# Patient Record
Sex: Female | Born: 1970
Health system: Southern US, Community
[De-identification: ages and names within clinical notes are randomized; demographics above are authoritative.]

## PROBLEM LIST (undated history)

## (undated) ENCOUNTER — Emergency Department (HOSPITAL_COMMUNITY): Admission: EM | Payer: No Typology Code available for payment source | Source: Home / Self Care

## (undated) ENCOUNTER — Ambulatory Visit (HOSPITAL_COMMUNITY): Payer: Medicare HMO

## (undated) DIAGNOSIS — F419 Anxiety disorder, unspecified: Secondary | ICD-10-CM

## (undated) DIAGNOSIS — T4145XA Adverse effect of unspecified anesthetic, initial encounter: Secondary | ICD-10-CM

## (undated) DIAGNOSIS — M545 Low back pain, unspecified: Secondary | ICD-10-CM

## (undated) DIAGNOSIS — E7800 Pure hypercholesterolemia, unspecified: Secondary | ICD-10-CM

## (undated) DIAGNOSIS — M1990 Unspecified osteoarthritis, unspecified site: Secondary | ICD-10-CM

## (undated) DIAGNOSIS — D259 Leiomyoma of uterus, unspecified: Secondary | ICD-10-CM

## (undated) DIAGNOSIS — G8929 Other chronic pain: Secondary | ICD-10-CM

## (undated) DIAGNOSIS — M549 Dorsalgia, unspecified: Secondary | ICD-10-CM

## (undated) DIAGNOSIS — T8859XA Other complications of anesthesia, initial encounter: Secondary | ICD-10-CM

## (undated) DIAGNOSIS — I10 Essential (primary) hypertension: Secondary | ICD-10-CM

## (undated) HISTORY — PX: BACK SURGERY: SHX140

## (undated) HISTORY — PX: TUBAL LIGATION: SHX77

## (undated) HISTORY — PX: ENDOMETRIAL ABLATION: SHX621

## (undated) HISTORY — PX: ECTOPIC PREGNANCY SURGERY: SHX613

---
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
## 2004-07-01 HISTORY — PX: LUMBAR DISC SURGERY: SHX700

## 2004-12-19 ENCOUNTER — Emergency Department (HOSPITAL_COMMUNITY): Admission: EM | Admit: 2004-12-19 | Discharge: 2004-12-19 | Payer: Self-pay | Admitting: Emergency Medicine

## 2004-12-29 ENCOUNTER — Emergency Department (HOSPITAL_COMMUNITY): Admission: EM | Admit: 2004-12-29 | Discharge: 2004-12-29 | Payer: Self-pay | Admitting: Emergency Medicine

## 2005-01-02 ENCOUNTER — Ambulatory Visit: Payer: Self-pay | Admitting: Orthopedic Surgery

## 2005-01-21 ENCOUNTER — Ambulatory Visit: Payer: Self-pay | Admitting: Orthopedic Surgery

## 2005-02-08 ENCOUNTER — Encounter: Admission: RE | Admit: 2005-02-08 | Discharge: 2005-02-08 | Payer: Self-pay | Admitting: Orthopedic Surgery

## 2005-02-11 ENCOUNTER — Ambulatory Visit: Payer: Self-pay | Admitting: Orthopedic Surgery

## 2005-02-18 ENCOUNTER — Ambulatory Visit: Payer: Self-pay | Admitting: Orthopedic Surgery

## 2005-02-19 ENCOUNTER — Inpatient Hospital Stay (HOSPITAL_COMMUNITY): Admission: AD | Admit: 2005-02-19 | Discharge: 2005-02-22 | Payer: Self-pay | Admitting: Orthopedic Surgery

## 2005-02-28 ENCOUNTER — Ambulatory Visit: Payer: Self-pay | Admitting: Orthopedic Surgery

## 2005-03-29 ENCOUNTER — Encounter: Admission: RE | Admit: 2005-03-29 | Discharge: 2005-03-29 | Payer: Self-pay | Admitting: Neurosurgery

## 2005-04-02 ENCOUNTER — Encounter: Admission: RE | Admit: 2005-04-02 | Discharge: 2005-04-02 | Payer: Self-pay | Admitting: Neurosurgery

## 2005-05-28 ENCOUNTER — Ambulatory Visit (HOSPITAL_COMMUNITY): Admission: RE | Admit: 2005-05-28 | Discharge: 2005-05-29 | Payer: Self-pay | Admitting: Neurosurgery

## 2005-07-03 ENCOUNTER — Encounter: Admission: RE | Admit: 2005-07-03 | Discharge: 2005-07-03 | Payer: Self-pay | Admitting: Neurosurgery

## 2005-07-10 ENCOUNTER — Ambulatory Visit (HOSPITAL_COMMUNITY): Admission: RE | Admit: 2005-07-10 | Discharge: 2005-07-10 | Payer: Self-pay | Admitting: Neurosurgery

## 2005-07-14 ENCOUNTER — Encounter: Admission: RE | Admit: 2005-07-14 | Discharge: 2005-07-14 | Payer: Self-pay | Admitting: Neurosurgery

## 2006-01-20 ENCOUNTER — Encounter
Admission: RE | Admit: 2006-01-20 | Discharge: 2006-01-20 | Payer: Self-pay | Admitting: Physical Medicine and Rehabilitation

## 2006-04-16 ENCOUNTER — Other Ambulatory Visit: Admission: RE | Admit: 2006-04-16 | Discharge: 2006-04-16 | Payer: Self-pay | Admitting: Obstetrics and Gynecology

## 2006-08-25 ENCOUNTER — Ambulatory Visit (HOSPITAL_COMMUNITY): Admission: RE | Admit: 2006-08-25 | Discharge: 2006-08-25 | Payer: Self-pay | Admitting: Obstetrics and Gynecology

## 2006-09-03 ENCOUNTER — Emergency Department (HOSPITAL_COMMUNITY): Admission: EM | Admit: 2006-09-03 | Discharge: 2006-09-03 | Payer: Self-pay | Admitting: Emergency Medicine

## 2007-07-15 ENCOUNTER — Ambulatory Visit (HOSPITAL_COMMUNITY): Admission: RE | Admit: 2007-07-15 | Discharge: 2007-07-15 | Payer: Self-pay | Admitting: Obstetrics and Gynecology

## 2007-07-28 ENCOUNTER — Other Ambulatory Visit: Admission: RE | Admit: 2007-07-28 | Discharge: 2007-07-28 | Payer: Self-pay | Admitting: Obstetrics and Gynecology

## 2007-08-10 ENCOUNTER — Ambulatory Visit (HOSPITAL_COMMUNITY): Admission: RE | Admit: 2007-08-10 | Discharge: 2007-08-10 | Payer: Self-pay | Admitting: Obstetrics and Gynecology

## 2007-08-19 ENCOUNTER — Ambulatory Visit (HOSPITAL_COMMUNITY): Admission: RE | Admit: 2007-08-19 | Discharge: 2007-08-19 | Payer: Self-pay | Admitting: Obstetrics and Gynecology

## 2007-09-16 ENCOUNTER — Ambulatory Visit (HOSPITAL_COMMUNITY): Admission: RE | Admit: 2007-09-16 | Discharge: 2007-09-16 | Payer: Self-pay | Admitting: Obstetrics and Gynecology

## 2007-10-13 ENCOUNTER — Inpatient Hospital Stay (HOSPITAL_COMMUNITY): Admission: AD | Admit: 2007-10-13 | Discharge: 2007-10-13 | Payer: Self-pay | Admitting: Obstetrics and Gynecology

## 2007-10-15 ENCOUNTER — Inpatient Hospital Stay (HOSPITAL_COMMUNITY): Admission: AD | Admit: 2007-10-15 | Discharge: 2007-10-19 | Payer: Self-pay | Admitting: Obstetrics and Gynecology

## 2007-10-17 ENCOUNTER — Encounter (INDEPENDENT_AMBULATORY_CARE_PROVIDER_SITE_OTHER): Payer: Self-pay | Admitting: Obstetrics and Gynecology

## 2007-11-28 ENCOUNTER — Inpatient Hospital Stay (HOSPITAL_COMMUNITY): Admission: AD | Admit: 2007-11-28 | Discharge: 2007-12-03 | Payer: Self-pay | Admitting: General Surgery

## 2008-05-18 ENCOUNTER — Ambulatory Visit (HOSPITAL_COMMUNITY): Admission: RE | Admit: 2008-05-18 | Discharge: 2008-05-18 | Payer: Self-pay | Admitting: Neurological Surgery

## 2008-05-21 ENCOUNTER — Ambulatory Visit (HOSPITAL_COMMUNITY): Admission: RE | Admit: 2008-05-21 | Discharge: 2008-05-21 | Payer: Self-pay | Admitting: Neurological Surgery

## 2009-09-06 ENCOUNTER — Ambulatory Visit (HOSPITAL_COMMUNITY): Admission: RE | Admit: 2009-09-06 | Discharge: 2009-09-06 | Payer: Self-pay | Admitting: Obstetrics and Gynecology

## 2010-06-07 ENCOUNTER — Observation Stay (HOSPITAL_COMMUNITY)
Admission: EM | Admit: 2010-06-07 | Discharge: 2010-06-07 | Payer: Self-pay | Source: Home / Self Care | Admitting: Emergency Medicine

## 2010-07-22 ENCOUNTER — Encounter: Payer: Self-pay | Admitting: Orthopedic Surgery

## 2010-07-22 ENCOUNTER — Encounter: Payer: Self-pay | Admitting: Obstetrics and Gynecology

## 2010-08-13 ENCOUNTER — Other Ambulatory Visit: Payer: Self-pay | Admitting: Dermatology

## 2010-09-10 LAB — CBC
HCT: 38.6 % (ref 36.0–46.0)
Hemoglobin: 12.6 g/dL (ref 12.0–15.0)
MCV: 80.8 fL (ref 78.0–100.0)
RBC: 4.78 MIL/uL (ref 3.87–5.11)
WBC: 10.8 10*3/uL — ABNORMAL HIGH (ref 4.0–10.5)

## 2010-09-10 LAB — URINE MICROSCOPIC-ADD ON

## 2010-09-10 LAB — POCT I-STAT, CHEM 8
BUN: 12 mg/dL (ref 6–23)
BUN: 13 mg/dL (ref 6–23)
Calcium, Ion: 1.05 mmol/L — ABNORMAL LOW (ref 1.12–1.32)
Chloride: 106 mEq/L (ref 96–112)
Creatinine, Ser: 1.2 mg/dL (ref 0.4–1.2)
Creatinine, Ser: 1.2 mg/dL (ref 0.4–1.2)
Glucose, Bld: 120 mg/dL — ABNORMAL HIGH (ref 70–99)
Hemoglobin: 14.3 g/dL (ref 12.0–15.0)
Sodium: 137 mEq/L (ref 135–145)
TCO2: 25 mmol/L (ref 0–100)
TCO2: 28 mmol/L (ref 0–100)

## 2010-09-10 LAB — DIFFERENTIAL
Basophils Absolute: 0.1 10*3/uL (ref 0.0–0.1)
Eosinophils Relative: 2 % (ref 0–5)
Lymphocytes Relative: 23 % (ref 12–46)
Lymphs Abs: 2.4 10*3/uL (ref 0.7–4.0)
Monocytes Absolute: 0.6 10*3/uL (ref 0.1–1.0)
Monocytes Relative: 6 % (ref 3–12)
Neutro Abs: 7.4 10*3/uL (ref 1.7–7.7)

## 2010-09-10 LAB — URINALYSIS, ROUTINE W REFLEX MICROSCOPIC
Glucose, UA: NEGATIVE mg/dL
Hgb urine dipstick: NEGATIVE
Ketones, ur: NEGATIVE mg/dL
Leukocytes, UA: NEGATIVE
pH: 6 (ref 5.0–8.0)

## 2010-09-21 ENCOUNTER — Inpatient Hospital Stay (HOSPITAL_COMMUNITY): Payer: 59

## 2010-09-21 ENCOUNTER — Observation Stay (HOSPITAL_COMMUNITY)
Admission: AD | Admit: 2010-09-21 | Discharge: 2010-09-23 | Disposition: A | Discharge status: Home / Self Care | Payer: 59 | Source: Ambulatory Visit | Attending: Obstetrics and Gynecology | Admitting: Obstetrics and Gynecology

## 2010-09-21 ENCOUNTER — Observation Stay (HOSPITAL_COMMUNITY): Payer: 59

## 2010-09-21 DIAGNOSIS — N949 Unspecified condition associated with female genital organs and menstrual cycle: Secondary | ICD-10-CM | POA: Insufficient documentation

## 2010-09-21 DIAGNOSIS — I10 Essential (primary) hypertension: Secondary | ICD-10-CM | POA: Insufficient documentation

## 2010-09-21 DIAGNOSIS — N739 Female pelvic inflammatory disease, unspecified: Secondary | ICD-10-CM

## 2010-09-21 DIAGNOSIS — R109 Unspecified abdominal pain: Secondary | ICD-10-CM

## 2010-09-21 LAB — COMPREHENSIVE METABOLIC PANEL
ALT: 13 U/L (ref 0–35)
AST: 18 U/L (ref 0–37)
Albumin: 3.7 g/dL (ref 3.5–5.2)
Alkaline Phosphatase: 38 U/L — ABNORMAL LOW (ref 39–117)
BUN: 16 mg/dL (ref 6–23)
GFR calc Af Amer: 59 mL/min — ABNORMAL LOW (ref >60)
Potassium: 3.8 mEq/L (ref 3.5–5.1)
Sodium: 137 mEq/L (ref 135–145)
Total Protein: 7.2 g/dL (ref 6.0–8.3)

## 2010-09-21 LAB — MRSA PCR SCREENING: MRSA by PCR: NEGATIVE

## 2010-09-21 LAB — CBC
Hemoglobin: 11.4 g/dL — ABNORMAL LOW (ref 12.0–15.0)
MCV: 83.2 fL (ref 78.0–100.0)
Platelets: 297 10*3/uL (ref 150–400)
Platelets: 283 10*3/uL (ref 150–400)
RBC: 4.56 MIL/uL (ref 3.87–5.11)
RBC: 4.28 MIL/uL (ref 3.87–5.11)
RDW: 14 % (ref 11.5–15.5)
WBC: 14.3 10*3/uL — ABNORMAL HIGH (ref 4.0–10.5)
WBC: 13.4 10*3/uL — ABNORMAL HIGH (ref 4.0–10.5)

## 2010-09-21 LAB — DIFFERENTIAL
Basophils Absolute: 0.1 10*3/uL (ref 0.0–0.1)
Basophils Relative: 1 % (ref 0–1)
Eosinophils Absolute: 0.4 10*3/uL (ref 0.0–0.7)
Eosinophils Absolute: 0.3 10*3/uL (ref 0.0–0.7)
Eosinophils Relative: 3 % (ref 0–5)
Lymphs Abs: 3.4 10*3/uL (ref 0.7–4.0)
Neutro Abs: 9 10*3/uL — ABNORMAL HIGH (ref 1.7–7.7)
Neutrophils Relative %: 68 % (ref 43–77)
Neutrophils Relative %: 67 % (ref 43–77)

## 2010-09-21 LAB — WET PREP, GENITAL: Yeast Wet Prep HPF POC: NONE SEEN

## 2010-09-21 MED ORDER — IOHEXOL 300 MG/ML  SOLN
100.0000 mL | Freq: Once | INTRAMUSCULAR | Status: AC | PRN
  Administered 2010-09-21: 100 mL via INTRAVENOUS

## 2010-09-22 LAB — GC/CHLAMYDIA PROBE AMP, GENITAL
Chlamydia, DNA Probe: NEGATIVE
GC Probe Amp, Genital: NEGATIVE

## 2010-09-23 LAB — CBC
HCT: 35.8 % — ABNORMAL LOW (ref 36.0–46.0)
Hemoglobin: 11.5 g/dL — ABNORMAL LOW (ref 12.0–15.0)
Hemoglobin: 11.4 g/dL — ABNORMAL LOW (ref 12.0–15.0)
MCHC: 31.7 g/dL (ref 30.0–36.0)
MCV: 75.6 fL — ABNORMAL LOW (ref 78.0–100.0)
RBC: 4.73 MIL/uL (ref 3.87–5.11)
WBC: 11.6 10*3/uL — ABNORMAL HIGH (ref 4.0–10.5)
WBC: 7.6 10*3/uL (ref 4.0–10.5)

## 2010-09-23 LAB — DIFFERENTIAL
Basophils Absolute: 0.1 10*3/uL (ref 0.0–0.1)
Basophils Relative: 1 % (ref 0–1)
Lymphocytes Relative: 26 % (ref 12–46)
Neutro Abs: 7.3 10*3/uL (ref 1.7–7.7)
Neutrophils Relative %: 63 % (ref 43–77)

## 2010-09-25 NOTE — Discharge Summary (Signed)
  Taylor Bowman, GAD                ACCOUNT NO.:  [id number]  MEDICAL RECORD NO.:  [id number]           PATIENT TYPE:  O  LOCATION:  9306                          [facility]  [facility]  PHYSICIAN:  Gerald Leitz, MD          DATE OF BIRTH:  [birth year]  DATE OF ADMISSION:  09/21/2010 DATE OF DISCHARGE:  09/23/2010                              DISCHARGE SUMMARY   ADMISSION DIAGNOSES: 1. Pelvic pain. 2. Abdominal pain. 3. Hypertension.  DISCHARGE DIAGNOSES: 1. Pelvic inflammatory disease. 2. Hypertension.  BRIEF HOSPITAL COURSE:  The patient presented to the Maternity Admissions Unit on September 21, 2010 complaining of pelvic and lower abdominal pain.  She was seen by the nurse practitioner in MAU and there was concern for appendicitis.  CT scan was ordered and this was negative.  She was diagnosed with pelvic inflammatory disease based on physical exam.  She received cefotetan 2 grams IV q.12 h. as well as doxycycline 100 mg t.i.d. with improvement of her pelvic pain.  She is discharged home on hospital day #3 in stable and improved condition on the following medications, doxycycline, metronidazole, Motrin, and Diflucan.  She will follow up in the office in 2 weeks for a hospital followup visit.     Gerald Leitz, MD     TC/MEDQ  D:  09/23/2010  T:  09/24/2010  Job:  [id number]  Electronically Signed by Gerald Leitz MD on 09/25/2010 06:34:28 PM

## 2010-11-13 NOTE — H&P (Signed)
NAMEMARRIAH, Taylor Bowman NO.:  [id number]   MEDICAL RECORD NO.:  [id number]          PATIENT TYPE:  MAT   LOCATION:  MATC                          [facility]  [facility]   PHYSICIAN:  Gerald Leitz, MD          DATE OF BIRTH:  July 13, 1970   DATE OF ADMISSION:  10/15/2007  DATE OF DISCHARGE:                              HISTORY & PHYSICAL   CHIEF COMPLAINT:  High blood pressure, headache.   CURRENT HISTORY:  This is a 40 year old G5, P1-0-3-1, at approximately  26 weeks 3 days' estimated gestational age based on a 12-week ultrasound  with estimated date of delivery of January 18, 2008, pregnancy complicated  by severe chronic hypertension, advanced maternal age, use of ACE  inhibitor in first trimester and chronic back pain.  The patient  presents to Maternity Admission Unit complaining of blood pressure at  home of 160/100 even after taking a double dose of her Norvasc.  She is  complaining of headache as well as seeing spots at times, no blurred  vision, some shortness of breath, no right upper quadrant pain, positive  fetal movement, no leakage of fluid, no vaginal bleeding, occasional  contractions.   PAST OBSTETRICAL HISTORY:  1. Spontaneous vaginal delivery in 1993, induced secondary to      preeclampsia at 33-35 weeks' gestational age.  2. Ectopic pregnancy in 1998.  3. Ectopic in 2002.  4. Elective abortion in 2008.   PAST MEDICAL HISTORY:  Chronic hypertension.   PAST SURGICAL HISTORY:  1. Left salpingectomy.  2. Two laparoscopies for ectopic pregnancies.  3. Oral surgery.  4. Diskectomy in 2006.   MEDICATIONS:  1. Norvasc 10 mg daily.  2. Methyldopa 1000 mg t.i.d.  3. Prenatal vitamins.  4. Flexeril as needed.  5. Opana as needed for back pain.   ALLERGIES:  DEMEROL and CODEINE.   SOCIAL HISTORY:  The patient is divorced.  She denies tobacco, alcohol  or illicit drug use.   FAMILY HISTORY:  Noncontributory.   REVIEW OF SYSTEMS:  Negative except as  stated in history of current  illness.   PHYSICAL EXAM:  VITAL SIGNS: Blood pressure 143/69, heart rate 112,  saturations 96% on room air, respirations 20.  CARDIOVASCULAR:  Slightly tachycardic, regular rhythm.  LUNGS:  Clear to auscultation bilaterally without rales, rhonchi or  wheezing.  ABDOMEN:  Gravid, nontender.  EXTREMITIES:  Trace edema.  Reflexes 2+.  No clonus.   ASSESSMENT AND PLAN:  A 26-3/7-week intrauterine pregnancy with chronic  hypertension.  We will admit for observation, serial blood pressures, 23-  hour urine to rule out possible superimposed preeclampsia, since the  patient required 2 doses of Norvasc this morning.  We will continue her  antihypertensives.      Gerald Leitz, MD  Electronically Signed     TC/MEDQ  D:  10/15/2007  T:  10/15/2007  Job:  [id number]

## 2010-11-13 NOTE — Op Note (Signed)
NAMEAMORI, COOPERMAN NO.:  [id number]   MEDICAL RECORD NO.:  [id number]          PATIENT TYPE:  OBV   LOCATION:  5013                         FACILITY:  [facility]   PHYSICIAN:  Ollen Gross. Vernell Morgans, M.D. DATE OF BIRTH:  [birth year]   DATE OF PROCEDURE:  DATE OF DISCHARGE:                               OPERATIVE REPORT   PREOPERATIVE DIAGNOSES:  Right groin abscess and the left leg abscess.   POSTOPERATIVE DIAGNOSES:  Right groin abscess and the left leg abscess.   PROCEDURE:  Incision and drainage of right groin and left leg abscess.   SURGEON:  Ollen Gross. Vernell Morgans, M.D.   ANESTHESIA:  General.   DESCRIPTION OF PROCEDURE:  After informed consent was obtained, the  patient was brought to the operating room and placed in the supine  position on the operating room table.  After induction of general  anesthesia, the patient's right groin and left leg were prepped with  Betadine and draped in usual sterile manner.  Attention was first turned  to the left leg, a small cruciate-type incision was made overlying the  palpable abscess with a #15 blade knife.  This incision was carried down  to the skin and into the subcutaneous tissue and abscess pocket blunted  with a hemostat.  Once the pocket was opened, hemostasis was achieved  using the Bovie electrocautery.  The wound was then packed with 2 x 2  gauze and the sterile dressings were applied.  Attention was then turned  to the right groin.  The patient had an opening in the skin and the  cavity was probed with the hemostat.  The cavity extended both  superiorly, inferiorly, and medially.  The incision was opened further  sharply with the electrocautery down through the skin and the  subcutaneous tissue into the abscess cavity.  Hemostasis was achieved  using the Bovie electrocautery.  Once the cavity had been completely  opened and loculations have been broken up blunt finger dissection, and  the wound was packed with  moistened Kerlix gauze and sterile dressings  were applied.  The patient tolerated the procedure well.  At the end of  the case, all needles, sponge, and instrument counts were correct.  The  patient was then awaken and taken to the recovery room in stable  condition.      Ollen Gross. Vernell Morgans, M.D.  Electronically Signed     PST/MEDQ  D:  11/27/2007  T:  11/28/2007  Job:  [id number]

## 2010-11-13 NOTE — Discharge Summary (Signed)
Taylor Bowman, ABER NO.:  [id number]   MEDICAL RECORD NO.:  [id number]          PATIENT TYPE:  INP   LOCATION:  5013                         FACILITY:  [facility]   PHYSICIAN:  Cherylynn Ridges, M.D.    DATE OF BIRTH:  1970-10-24   DATE OF ADMISSION:  11/27/2007  DATE OF DISCHARGE:  12/03/2007                               DISCHARGE SUMMARY   DISCHARGING PHYSICIAN:  Angelia Mould. Derrell Lolling, MD   OPERATIVE SURGEON:  Ollen Gross. Vernell Morgans, MD   CHIEF COMPLAINT AND REASON FOR ADMISSION:  Taylor Bowman is a 40 year old  female patient recent postpartum after premature delivery.  She was seen  by Dr. Lindie Spruce at [facility] in our office on Friday 29, 2009 with  complaints of abscess in right thigh.  She states this area as started  as what she thought was an insect bite.  There were tiny areas and they  were very pruritic in nature.  Eventually, she had 2 abscesses evolve a  smaller of the left lateral knee and the larger one on the right medial  thigh near the groin.  These areas were draining and cultures were  obtained by Dr. Lindie Spruce, and the patient was subsequently admitted to the  hospital for observation and possible intraoperative incision and  drainage.   ADMITTING DIAGNOSES:  1. Right groin and left lateral thigh abscesses concerning for      possible methicillin-resistant Staphylococcus aureus.  2. Lactating female, recent delivery of premature infant.   HOSPITAL COURSE:  The patient was admitted to [facility] where she  was started empirically on Kefzol 1 g and called to OR.  She  subsequently was taken to the OR by Dr. Carolynne Edouard where she underwent an I&D  of right groin and left leg abscesses.  The patient tolerated procedure  well.  There was minimal amount of purulent material obtained and since  cultures have been obtained at the office, no cultures were obtained in  the OR.  The patient was sent back to the general floor to recover where  she was started on normal  saline packing b.i.d. and her antibiotic  coverage was narrowed to vancomycin IV.   From a wound standpoint, the wound remained clean.  She did have some  marked purulent induration which gradually decreased over the next  several days, especially with use of localized heating pad to the  region.  She also had some soft edema of the thigh which also gradually  decreased throughout the hospitalization.   The patient's main issues postoperatively were regarding pain control.  She required PCA Dilaudid.  IV Toradol was added and later the patient  developed issues with nausea and vomiting.  Bowel sounds are present.  Abdomen was otherwise benign.  Pain medications were readjusted.  She  was at one point attempted on p.o. Vicodin, but this caused nausea and  vomiting so she was weaned off the PCA Dilaudid, switched to p.o.  Dilaudid with Tylenol, continued IV Toradol which she did well on.  Protonix was added.  She did have difficulties towards  the end of her  hospitalization with poor IV access and at that time she was receiving  IV vancomycin as well as p.o. Cipro which had been added on November 30, 2007  by Dr. Derrell Lolling.  PICC line was required and the patient had this placed  for IV team, tolerated well, and this was discontinued prior to  discharge.  The PICC line was located in the left upper extremity.   Final wound cultures were obtained from the office which demonstrated  methicillin-resistant Staph, sensitive to Cipro, Levaquin, tetracycline,  and Bactrim.  Because the patient is a lactating female, the  tetracyclines were obviously not indicated and she was giving breast  milk to her child and we discussed with pharmacy and it was a determined  that although Cipro was not directly harmful they did discuss with  [facility] and stated we could use Cipro, but Dr. Jacinto Halim  preference was for the Bactrim and if we wish to discard the milk to  make sure there was no Cipro within the  milk 6-hour period as the last  dose was indicated.  The patient received her last dose at 8 a.m. on the  date of discharge.   On the date of discharge, the patient's leg again had marked decrease  and soft edema.  The induration had nearly resolved.  The wound base  itself was pink and granular.  Important note that on postop day 2, Dr.  Derrell Lolling had evaluated the patient because she was concerned about the  degree of swelling and induration and given the fact that this was a  MRSA infection it was felt that the only way to rapidly decrease the  induration was to do a wider excision of the wound tissue in the OR.  The patient was informed this would leave a larger scar.  At this time,  the patient has deferred and wishes to treat this issue medically.   The patient has a history of pregnancy-induced hypertension and  preeclampsia and was on Norvasc preadmission.  This medication was  resumed 24 hours prior to discharge because the patient's blood pressure  had finally rebounded to her preadmission state and was in the  hypertensive range.   FINAL DISCHARGE DIAGNOSES:  1. Right medial thigh/groin and left lateral knee abscesses, positive      for methicillin-resistant Staph.  2. Status post intraoperative incision and drainage of above-stated      abscesses by Dr. Carolynne Edouard.  3. History of preeclampsia and hypertension status post recent      premature delivery.  4. Lactating female.   DISCHARGE MEDICATIONS:  The patient will resume the following  medications.  1. Norvasc 10 mg daily.  2. Multiple vitamin daily.  3. New medications include Bactrim DS one p.o. b.i.d. for 14 days.  4. Protonix 40 mg daily while on ibuprofen.  5. Dilaudid 2 mg p.o. q.4 h. p.r.n. pain #40 dispensed with 0 refills.  6. Ibuprofen 600 mg every 8 hours as needed for pain.  7. Over-the-counter Colace 100 mg 2-3 times daily to prevent      constipation.  8. Over-the-counter MiraLax as directed on bottle to  prevent      constipation.  Please note the patient was started on MiraLax and      Colace 24 hours prior to discharge.   DISCHARGE INSTRUCTIONS:  Return to work not applicable.  The patient has  been out on work disability since 2006.  Diet, no restrictions.  Wound  care, normal saline packing into the wound twice daily, home health RN  to assist.  [facility] seen the patient in the hospital.  Activity, increase activity slowly, may walk up steps, may shower.  Other instructions, the patient has been informed that her last dose of  Cipro was 8 o'clock today.  Please discard any breast milk already  pumped or saved.  Also, please discard any pumped breast milk for the  next 6  hours from the time of dosing of Cipro and make sure you follow up with  lactation consult at [facility] after discharge.  You have to call  the surgeon if you have any problems or questions regarding your wound.   FOLLOWUP APPOINTMENTS:  She is to call Dr. Billey Chang office at [phone] to  be seen in 2 weeks.      Allison L. Rennis Harding, N.P.      Cherylynn Ridges, M.D.  Electronically Signed    ALE/MEDQ  D:  12/03/2007  T:  12/03/2007  Job:  [id number]   cc:   Ollen Gross. Vernell Morgans, M.D.

## 2010-11-13 NOTE — H&P (Signed)
NAMEAMADI, Taylor Bowman   MEDICAL RECORD NO.:  [id number]          PATIENT TYPE:  OBV   LOCATION:  5013                         FACILITY:  [facility]   PHYSICIAN:  Cherylynn Ridges, M.D.    DATE OF BIRTH:  [birth year]   DATE OF ADMISSION:  11/27/2007  DATE OF DISCHARGE:                              HISTORY & PHYSICAL   IDENTIFICATION AND CHIEF COMPLAINT:  The patient is a 40 year old female  with a large right thigh abscesses who is being transferred to [facility] for consideration of surgical debridement and irrigation and  drainage of a right thigh abscess.   HISTORY OF PRESENT ILLNESS:  The patient started having this problem on  Sunday, when she developed what was thought to be small insect bite on  the inner portion of her right upper thigh.  She is subsequently  developed a similar abscess on the left leg.  She has had no fevers and  chills but this has progressively gotten worse where she came in for  evaluation, was sent to our office where she was found to have a very  large abscess, which upon attempt to drain in the office.  She was then  adequately drained because of the large size and severeness of the  patient's pain, because of this I am seeing her in the [facility] short-  stay to be evaluated and worked up for surgical debridement later on  today.   PAST MEDICAL HISTORY:  Unremarkable.  No history of pulmonary vascular  or other disease.   MEDICATIONS:  She is taking medications, Norvasc, multivitamins,  Flexeril, and Opana 40 extended release b.i.d.   ALLERGIES:  BEE STINGS, CODEINE, and DEMEROL.   PAST SURGICAL HISTORY:  She has had a diskectomy in the past, ectopic  pregnancy removal, not sure if she lost a tooth or not and also wisdom  teeth extraction.   SOCIAL HISTORY:  She is a nonsmoker, nondrinker.   REVIEW OF SYSTEMS:  The patient has a 15 point check list, which is all  negative with exception of high blood pressure,  some occasional fatigue,  although she is only 4 weeks postpartum.  She is gravida 2, para 2.   PHYSICAL EXAMINATION:  VITAL SIGNS: Blood pressures 147/97.  She is  afebrile at 98.5.  She weighs 202 pounds.  HEENT:  She is normocephalic  and atraumatic.  EXTREMITIES: I did concentrate on her left leg and  right thigh where she has a horn size almost grapefruit size abscess  with significant tenderness and small opening draining pus, which upon  attempted incision and drainage in the office.  I did get back more pus,  but caused the patient significant bleeding and pain therefore I felt it  was better to get better control with optimal anesthesia in the  hospital.   PLAN:  So the plan is to send the patient [facility] for workup and  drainage.  I have talked to Dr.Toth about this and he wish you will go  ahead and proceed.  I did send a  culture from our office to [facility].  I have given her no antibiotics.  I have written for  her to get some Kefzol on call  to the operating room that may need to  be changed to an antibiotic sensitive that works on the methicillin-  resistant staph.      Cherylynn Ridges, M.D.  Electronically Signed     JOW/MEDQ  D:  11/27/2007  T:  11/28/2007  Job:  [id number]

## 2010-11-16 NOTE — Discharge Summary (Signed)
Taylor Bowman, EMBREE NO.:  [id number]   MEDICAL RECORD NO.:  [id number]          PATIENT TYPE:  INP   LOCATION:  9149                          [facility]ILITY[facility]  [facility]   PHYSICIAN:  Gerald Leitz, MD          DATE OF BIRTH:  1970-10-15   DATE OF ADMISSION:  10/15/2007  DATE OF DISCHARGE:  10/19/2007                               DISCHARGE SUMMARY   DISCHARGE SUMMARY/TRANSFER NOTE   She was transferred to [facility] to Dr. Roney Mans on  October 19, 2007.   ADMISSION DIAGNOSES:  1. A 26-3/7th weeks' intrauterine pregnancy.  2. Chronic hypertension.  3. Rule out superimposed preeclampsia.  4. Narcotic dependence.  5. Advanced maternal age.  6. Chronic back pain.   DISCHARGE DIAGNOSES:  1. A 26-3/7th weeks' intrauterine pregnancy.  2. Chronic hypertension.  3. Rule out superimposed preeclampsia.  4. Narcotic dependence.  5. Advanced maternal age.  6. Chronic back pain.  7. Gestational diabetes.   BRIEF HOSPITAL COURSE:  The patient was admitted on October 15, 2007, with  elevated blood pressure.  She had a blood pressure at home of 160/100,  upon arrival to the hospital, her blood pressure had decreased and was  143/69.  She received multiple narcotics for her headache with only  improvement with IV morphine.  She was admitted and had a 24-hour urine  collection, which showed 6 g of protein that had increased from her  baseline 24-hour urine of 4 g.  The emergency room consult was obtained.  She received steroids for fetal lung maturity.  During her  hospitalization, the patient had several episodes of shortness of breath  and chest pain.  She had a negative chest x-ray and no evidence of  pulmonary edema during these episodes.  She also had negative EKG.  The  shortness of breath resolved spontaneously, however, etiology was  unclear.  Of note, the patient also was diagnosed with gestational  diabetes and started on insulin during her hospitalization.   On October 19, 2007, the patient was transferred to [facility] for  further evaluation and management given continuous episodes of shortness  of breath that would resolved spontaneously and chest pain for which the  etiology was unclear.  She did have a 2-D echo  performed during her hospitalization that was negative as well.  She was  transferred to the care of Dr. Roney Mans with [facility] and she was transferred to [facility].   CONDITION AT DISCHARGE:  Stable.      Gerald Leitz, MD  Electronically Signed     TC/MEDQ  D:  11/01/2007  T:  11/02/2007  Job:  [id number]

## 2010-11-16 NOTE — Op Note (Signed)
NAMEKAITHLYN, TEAGLE NO.:  [id number]   MEDICAL RECORD NO.:  [id number]          PATIENT TYPE:  OIB   LOCATION:  3016                         FACILITY:  [facility]   PHYSICIAN:  Reinaldo Meeker, M.D. DATE OF BIRTH:  04/22/1971   DATE OF PROCEDURE:  05/28/2005  DATE OF DISCHARGE:                                 OPERATIVE REPORT           ______________________________  Reinaldo Meeker, M.D.     ROK/MEDQ  D:  05/28/2005  T:  05/28/2005  Job:  [id number]

## 2010-11-16 NOTE — H&P (Signed)
Taylor Bowman, Taylor Bowman NO.:  [id number]   MEDICAL RECORD NO.:  [id number]          PATIENT TYPE:  INP   LOCATION:  A308                          FACILITY:  [facility]   PHYSICIAN:  Vickki Hearing, M.D.DATE OF BIRTH:  29-Sep-1970   DATE OF ADMISSION:  02/19/2005  DATE OF DISCHARGE:  LH                                HISTORY & PHYSICAL   CHIEF COMPLAINT:  Severe back and leg pain.   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  She was seen in the emergency  room, tried to work and was unable to.  She presented to our office on January 02, 2005, after missing days of work on June 22 and June 23.  When she was  injured, she was injured lifting a patient and felt a pop in her back and  complained of severe, constant, sharp, aching, burning pain with radiation  to the left knee.  Actual date of injury is actually June 17.  She was  placed on Flexeril and Lortab which made her drowsy and did not help her  pain.  She had no associated bowel or bladder disfunction at that time.  I  treated her for several weeks initially with Tylenol No. 3, Skelaxin,  prednisone and amitriptyline.  She did not improve.  On subsequent visits  she was sent for an MRI.  She had a L3 root compression, L4-L5 disc  extrusion, L5-S1 foraminal stenosis.  We recommended epidural steroid  injection at L4-L5 and we took her out of her work retroactive to July 5.  We tried physical therapy, but eventually stopped that because of continued  pain.  After the first ESI, her pain actually got worse.  We asked for  neurosurgery consult and that is pending per review by Worker's  Compensation.  We extended her out of work to 6 weeks.   On Monday, August 21, she was still not improving even with the new  medications of Vicodin, Lyrica and Indocin and I recommended admission to  the hospital for IV steroids and better pain control.  We are  still awaiting  neurosurgery consultation approval per Worker's Compensation.   REVIEW OF SYSTEMS:  All systems normal.   ALLERGIES:  BEE STINGS.   PAST MEDICAL HISTORY:  History of migraine headaches.   PAST SURGICAL HISTORY:  1.  Ectopic pregnancy.  2.  Wisdom tooth extraction.   FAMILY HISTORY:  Heart disease, arthritis and diabetes.   SOCIAL HISTORY:  She is single.  She is a Designer, jewellery.  She does not  smoke or drink.  She has a 2-year nursing degree.   PHYSICAL EXAMINATION:  GENERAL:  She is well-developed, normal grooming,  hygiene and nutrition.  Body habitus medium.  NEUROLOGIC:  Alert and oriented x3.  Decreased sensation in her left L5  distribution.  She has hyperreflexic reflex at the left knee.  CARDIAC:  Normal pulses.  No venous stasis.  No edema.  Normal temperatures.  SKIN:  Without rash,  laceration, incision or ulceration.  LYMPHS:  Nodes were negative.  MUSCULOSKELETAL:  Back exam showed decreased range of motion in all planes.  Increased right paralumbar muscle tone.  Tenderness in L4-S1 with right  paralumbar tight and tender upper extremities.  Normal range of motion,  strength, stability and alignment.  Lower extremities with dorsiflexion  weakness on the left, otherwise range of motion, stability and alignment  were normal.   LABORATORY DATA AND X-RAY FINDINGS:  Radiograph which showed mild  degenerative disc disease and MRI which showed L3 root compression, L4-L5  disc extrusion and L5-S1 foraminal stenosis.  There is an L3-L4 disc  protrusion as well.   ASSESSMENT:  Disc extrusion with severe pain in L3 root impingement.   PLAN:  1.  Pain control.  2.  IV steroids.  3.  Neurontin, increase as tolerated and as necessary.      Vickki Hearing, M.D.  Electronically Signed     SEH/MEDQ  D:  02/20/2005  T:  02/20/2005  Job:  [id number]

## 2010-11-16 NOTE — Discharge Summary (Signed)
NAMECASSIDEE, DEATS NO.:  [id number]   MEDICAL RECORD NO.:  [id number]          PATIENT TYPE:  INP   LOCATION:  A308                          FACILITY:  [facility]   PHYSICIAN:  Vickki Hearing, M.D.DATE OF BIRTH:  11-29-1970   DATE OF ADMISSION:  02/19/2005  DATE OF DISCHARGE:  08/25/2006LH                                 DISCHARGE SUMMARY   ADMISSION DIAGNOSIS:  Severe back and leg pain.   HISTORY OF PRESENT ILLNESS:  A 40 year old female injured at work on June  17.  Workup for back pain and leg pain revealed L3 root compression, L4-L5  disc extrusion, L5-S1 foraminal stenosis.  She was given epidural steroids  and got worse.  The pain became more severe.  She was admitted for pain  control.   HOSPITAL COURSE:  We first tried her on Vicodin, Solu-Medrol and Neurontin  and pain persisted.  We then added morphine and Lorcet plus.  We increased  her Neurontin to 200 mg and it became somewhat better, but not completely  controlled.  She is awaiting approval from the [employer] nurse  to see a Midwife.  Her overall condition has improved slightly and she  will be allowed to go home.   DISCHARGE MEDICATIONS:  1.  Percocet 5/325 mg one q.4h.  2.  Neurontin 200 mg t.i.d.  3.  Prednisone 10 mg daily.  4.  Phenergan 25 mg q.4h. p.r.n. for nausea.   FOLLOW UP:  Follow up with me on August 31.  Will make arrangements for her  to see neurosurgeon pending Worker's Compensation approval.      Vickki Hearing, M.D.  Electronically Signed     SEH/MEDQ  D:  02/22/2005  T:  02/22/2005  Job:  [id number]

## 2010-11-16 NOTE — Op Note (Signed)
Taylor Bowman, Taylor Bowman NO.:  [id number]   MEDICAL RECORD NO.:  [id number]          PATIENT TYPE:  OIB   LOCATION:  3016                         FACILITY:  [facility]   PHYSICIAN:  Reinaldo Meeker, M.D. DATE OF BIRTH:  08-28-1970   DATE OF PROCEDURE:  05/28/2005  DATE OF DISCHARGE:                                 OPERATIVE REPORT   PREOPERATIVE DIAGNOSIS:  Herniated disc L5-S1, left.   POSTOPERATIVE DIAGNOSIS:  Herniated disc L5-S1, left.   PROCEDURE:  Left L5-S1 intralaminar laminotomy for excision of herniated  disc with the operating microscope.   SECONDARY PROCEDURE:  Microdissection L5-S1 disc and S1 nerve root.   SURGEON:  Reinaldo Meeker, M.D.   ASSISTANT:  Kathaleen Maser. Pool, M.D.   PROCEDURE IN DETAIL:  After being placed in the prone position, the  patient's back was prepped and draped in the usual sterile fashion.  A  localizing x-ray was taken prior to incision to identify the appropriate  level.  A midline incision was made above the spinous processes of L5 and  S1.  Using Bovie cutting current, the incision was carried to the spinous  processes.  Subperiosteal dissection was then carried out on the left sided  spinous processes and lamina.  Self-retaining retractor was placed for  exposure.  An x-ray showed we approached the appropriate level.  Using the  high speed drill, the inferior 1/2 of the of the L5 lamina and the medial  1/3 of the facet joint were removed.  The drill was then used to remove the  superior 1/2 of the S1 lamina.  Residual bone and ligamentum flavum were  removed in a piecemeal fashion.  The microscope was draped, brought onto the  field, and used for the remainder of the case.  Using microdissection  technique, the lateral aspect of the thecal sac and S1 nerve root were  identified.  Further coagulation was carried out down to the floor of the  canal to identify the L5-S1 disc which was found to be markedly herniated.  After  coagulating on the annulus, the annulus was incised with a 15 blade.  Using pituitary rongeurs and curets, thorough disc space clean out was  carried out.  At the same time, great care was taken to avoid injury to the  neural elements.  This was successfully done.  At this point, inspection was  carried out in all directions for any evidence of residual compression and  none could be identified.  Large amounts of irrigation were carried out.  Any bleeding was controlled with bipolar coagulation and Gelfoam.  The wound  was then closed using interrupted Vicryl in the muscle, fascia, subcutaneous  and subcuticular tissue, and staples on the skin.  Sterile dressings were  then applied and the patient was extubated and taken to the recovery room in  stable condition.           ______________________________  Reinaldo Meeker, M.D.     ROK/MEDQ  D:  05/28/2005  T:  05/28/2005  Job:  [id number]

## 2011-02-25 ENCOUNTER — Other Ambulatory Visit: Payer: Self-pay | Admitting: Rehabilitation

## 2011-02-25 DIAGNOSIS — M545 Low back pain, unspecified: Secondary | ICD-10-CM

## 2011-02-28 ENCOUNTER — Ambulatory Visit
Admission: RE | Admit: 2011-02-28 | Discharge: 2011-02-28 | Disposition: A | Discharge status: Home / Self Care | Payer: 59 | Source: Ambulatory Visit | Attending: Rehabilitation | Admitting: Rehabilitation

## 2011-02-28 DIAGNOSIS — M545 Low back pain: Secondary | ICD-10-CM

## 2011-03-26 LAB — URINALYSIS, ROUTINE W REFLEX MICROSCOPIC
Bilirubin Urine: NEGATIVE
Glucose, UA: NEGATIVE
Ketones, ur: NEGATIVE
Ketones, ur: 15 — AB
Leukocytes, UA: NEGATIVE
Leukocytes, UA: NEGATIVE
Nitrite: NEGATIVE
Nitrite: NEGATIVE
Protein, ur: >300 — AB
Specific Gravity, Urine: 1.02
Urobilinogen, UA: 0.2
Urobilinogen, UA: 0.2
pH: 6

## 2011-03-26 LAB — URINE MICROSCOPIC-ADD ON

## 2011-03-26 LAB — CBC
HCT: 26.4 — ABNORMAL LOW
HCT: 28.7 — ABNORMAL LOW
HCT: 28.8 — ABNORMAL LOW
HCT: 29.7 — ABNORMAL LOW
Hemoglobin: 10.2 — ABNORMAL LOW
Hemoglobin: 8.9 — ABNORMAL LOW
Hemoglobin: 9.8 — ABNORMAL LOW
MCHC: 33.8
MCHC: 34.5
MCHC: 35.1
MCV: 81.8
MCV: 82.8
MCV: 83.3
Platelets: 297
Platelets: 313
RBC: 3.17 — ABNORMAL LOW
RBC: 3.45 — ABNORMAL LOW
RBC: 3.52 — ABNORMAL LOW
RBC: 3.62 — ABNORMAL LOW
RDW: 15.1
RDW: 15.3
RDW: 15.5
WBC: 14.5 — ABNORMAL HIGH
WBC: 15.2 — ABNORMAL HIGH
WBC: 15.2 — ABNORMAL HIGH
WBC: 15.8 — ABNORMAL HIGH
WBC: 17.8 — ABNORMAL HIGH

## 2011-03-26 LAB — COMPREHENSIVE METABOLIC PANEL
ALT: 14
ALT: 16
ALT: 17
AST: 19
AST: 23
AST: 24
AST: 27
Albumin: 2.1 — ABNORMAL LOW
Albumin: 2.2 — ABNORMAL LOW
Alkaline Phosphatase: 71
Alkaline Phosphatase: 77
BUN: 11
BUN: 5 — ABNORMAL LOW
BUN: 6
CO2: 24
CO2: 24
CO2: 27
Calcium: 8.6
Calcium: 9.5
Chloride: 100
Chloride: 102
Chloride: 103
Creatinine, Ser: 0.84
Creatinine, Ser: 0.84
Creatinine, Ser: 0.92
GFR calc Af Amer: >60
GFR calc Af Amer: >60
GFR calc Af Amer: >60
GFR calc Af Amer: >60
GFR calc non Af Amer: >60
GFR calc non Af Amer: >60
GFR calc non Af Amer: >60
Glucose, Bld: 127 — ABNORMAL HIGH
Glucose, Bld: 132 — ABNORMAL HIGH
Glucose, Bld: 184 — ABNORMAL HIGH
Potassium: 3.1 — ABNORMAL LOW
Potassium: 3.9
Sodium: 135
Sodium: 137
Total Bilirubin: 0.4
Total Bilirubin: 0.5
Total Bilirubin: 0.6
Total Protein: 5.9 — ABNORMAL LOW
Total Protein: 6

## 2011-03-26 LAB — URIC ACID
Uric Acid, Serum: 6.5
Uric Acid, Serum: 6.8

## 2011-03-26 LAB — PROTEIN, URINE, 24 HOUR
Protein, 24H Urine: 6664 — ABNORMAL HIGH
Protein, Urine: 196

## 2011-03-26 LAB — CREATININE CLEARANCE, URINE, 24 HOUR
Collection Interval-CRCL: 24
Creatinine Clearance: 139 — ABNORMAL HIGH
Creatinine, 24H Ur: 1683
Creatinine, Urine: 49.5
Urine Total Volume-CRCL: 3400

## 2011-03-26 LAB — LUPUS ANTICOAGULANT PANEL
DRVVT: 39.6 (ref 36.1–47.0)
PTT Lupus Anticoagulant: 36.1 — ABNORMAL LOW (ref 36.3–48.8)

## 2011-03-26 LAB — ANA: Anti Nuclear Antibody(ANA): NEGATIVE

## 2011-03-26 LAB — LACTATE DEHYDROGENASE
LDH: 176
LDH: 182
LDH: 192

## 2011-03-26 LAB — ALT: ALT: 17

## 2011-03-26 LAB — WET PREP, GENITAL: Yeast Wet Prep HPF POC: NONE SEEN

## 2011-03-26 LAB — AST: AST: 23

## 2011-03-27 LAB — CBC
HCT: 34.4 — ABNORMAL LOW
Hemoglobin: 11.3 — ABNORMAL LOW
MCHC: 32.8
Platelets: 308
RBC: 4.13
RDW: 14.4
WBC: 12.2 — ABNORMAL HIGH

## 2011-03-27 LAB — DIFFERENTIAL
Basophils Absolute: 0.1
Basophils Relative: 0
Eosinophils Relative: 2
Lymphocytes Relative: 17
Lymphocytes Relative: 23
Lymphs Abs: 2.8
Monocytes Absolute: 0.7
Monocytes Relative: 6
Neutro Abs: 9.8 — ABNORMAL HIGH
Neutrophils Relative %: 75

## 2011-03-27 LAB — BASIC METABOLIC PANEL
CO2: 29
Calcium: 9
Creatinine, Ser: 0.97
GFR calc Af Amer: >60
GFR calc Af Amer: >60
GFR calc non Af Amer: >60
GFR calc non Af Amer: >60
Glucose, Bld: 101 — ABNORMAL HIGH
Potassium: 3.2 — ABNORMAL LOW
Sodium: 143

## 2011-03-28 LAB — CBC
HCT: 32.6 — ABNORMAL LOW
HCT: 34 — ABNORMAL LOW
MCV: 82.7
Platelets: 350
Platelets: 377
RDW: 14.6
WBC: 9.3

## 2011-04-02 LAB — CREATININE, SERUM: GFR calc non Af Amer: 56 — ABNORMAL LOW

## 2011-09-03 ENCOUNTER — Emergency Department (HOSPITAL_COMMUNITY)
Admission: EM | Admit: 2011-09-03 | Discharge: 2011-09-04 | Discharge status: Against Medical Advice | Payer: Medicare Other | Attending: Emergency Medicine | Admitting: Emergency Medicine

## 2011-09-03 ENCOUNTER — Encounter (HOSPITAL_COMMUNITY): Payer: Self-pay | Admitting: Emergency Medicine

## 2011-09-03 ENCOUNTER — Other Ambulatory Visit: Payer: Self-pay

## 2011-09-03 DIAGNOSIS — M79609 Pain in unspecified limb: Secondary | ICD-10-CM | POA: Insufficient documentation

## 2011-09-03 DIAGNOSIS — R079 Chest pain, unspecified: Secondary | ICD-10-CM | POA: Insufficient documentation

## 2011-09-03 LAB — CBC
Hemoglobin: 12.2 g/dL (ref 12.0–15.0)
Platelets: 311 10*3/uL (ref 150–400)
RBC: 4.52 MIL/uL (ref 3.87–5.11)

## 2011-09-03 LAB — BASIC METABOLIC PANEL
CO2: 27 mEq/L (ref 19–32)
GFR calc non Af Amer: 53 mL/min — ABNORMAL LOW (ref >90)
Glucose, Bld: 125 mg/dL — ABNORMAL HIGH (ref 70–99)
Potassium: 3.5 mEq/L (ref 3.5–5.1)
Sodium: 136 mEq/L (ref 135–145)

## 2011-09-03 NOTE — ED Notes (Signed)
Pt c/o left chest pain radiating into left neck and left arm  St's onset was yesterday  Comes and goes

## 2012-04-09 ENCOUNTER — Other Ambulatory Visit: Payer: Self-pay | Admitting: Orthopaedic Surgery

## 2012-04-09 DIAGNOSIS — M549 Dorsalgia, unspecified: Secondary | ICD-10-CM

## 2012-04-16 ENCOUNTER — Ambulatory Visit
Admission: RE | Admit: 2012-04-16 | Discharge: 2012-04-16 | Disposition: A | Discharge status: Home / Self Care | Payer: Medicare Other | Source: Ambulatory Visit | Attending: Orthopaedic Surgery | Admitting: Orthopaedic Surgery

## 2012-04-16 DIAGNOSIS — M549 Dorsalgia, unspecified: Secondary | ICD-10-CM

## 2013-01-27 ENCOUNTER — Other Ambulatory Visit: Payer: Self-pay | Admitting: Orthopaedic Surgery

## 2013-01-27 ENCOUNTER — Other Ambulatory Visit: Payer: Self-pay | Admitting: Family Medicine

## 2013-01-27 DIAGNOSIS — M5030 Other cervical disc degeneration, unspecified cervical region: Secondary | ICD-10-CM

## 2013-02-02 ENCOUNTER — Other Ambulatory Visit: Payer: Medicare Other

## 2013-02-09 ENCOUNTER — Other Ambulatory Visit: Payer: Medicare Other

## 2013-02-10 ENCOUNTER — Ambulatory Visit
Admission: RE | Admit: 2013-02-10 | Discharge: 2013-02-10 | Disposition: A | Discharge status: Home / Self Care | Payer: Medicare Other | Source: Ambulatory Visit | Attending: Orthopaedic Surgery | Admitting: Orthopaedic Surgery

## 2013-02-10 DIAGNOSIS — M5030 Other cervical disc degeneration, unspecified cervical region: Secondary | ICD-10-CM

## 2013-07-14 ENCOUNTER — Other Ambulatory Visit: Payer: Self-pay | Admitting: Obstetrics & Gynecology

## 2013-07-14 DIAGNOSIS — R928 Other abnormal and inconclusive findings on diagnostic imaging of breast: Secondary | ICD-10-CM

## 2013-07-27 ENCOUNTER — Ambulatory Visit
Admission: RE | Admit: 2013-07-27 | Discharge: 2013-07-27 | Disposition: A | Discharge status: Home / Self Care | Payer: Medicare Other | Source: Ambulatory Visit | Attending: Obstetrics & Gynecology | Admitting: Obstetrics & Gynecology

## 2013-07-27 DIAGNOSIS — R928 Other abnormal and inconclusive findings on diagnostic imaging of breast: Secondary | ICD-10-CM

## 2013-11-18 ENCOUNTER — Encounter (HOSPITAL_COMMUNITY): Payer: Self-pay | Admitting: Emergency Medicine

## 2013-11-18 ENCOUNTER — Emergency Department (HOSPITAL_COMMUNITY): Payer: Medicare Other

## 2013-11-18 ENCOUNTER — Emergency Department (HOSPITAL_COMMUNITY)
Admission: EM | Admit: 2013-11-18 | Discharge: 2013-11-18 | Disposition: A | Discharge status: Home / Self Care | Payer: Medicare Other | Attending: Emergency Medicine | Admitting: Emergency Medicine

## 2013-11-18 DIAGNOSIS — M25561 Pain in right knee: Secondary | ICD-10-CM

## 2013-11-18 DIAGNOSIS — M545 Low back pain, unspecified: Secondary | ICD-10-CM | POA: Insufficient documentation

## 2013-11-18 DIAGNOSIS — Z79899 Other long term (current) drug therapy: Secondary | ICD-10-CM | POA: Insufficient documentation

## 2013-11-18 DIAGNOSIS — M25562 Pain in left knee: Secondary | ICD-10-CM

## 2013-11-18 DIAGNOSIS — I10 Essential (primary) hypertension: Secondary | ICD-10-CM | POA: Insufficient documentation

## 2013-11-18 DIAGNOSIS — M25569 Pain in unspecified knee: Secondary | ICD-10-CM | POA: Insufficient documentation

## 2013-11-18 HISTORY — DX: Essential (primary) hypertension: I10

## 2013-11-18 MED ORDER — PREDNISONE 10 MG PO TABS: 20.0000 mg | ORAL_TABLET | Freq: Every day | ORAL | Status: DC

## 2013-11-18 MED ORDER — PREDNISONE 20 MG PO TABS
60.0000 mg | ORAL_TABLET | Freq: Once | ORAL | Status: AC
  Administered 2013-11-18: 60 mg via ORAL
  Filled 2013-11-18: qty 3

## 2013-11-18 NOTE — ED Notes (Addendum)
Patient returned from xray.

## 2013-11-18 NOTE — Discharge Instructions (Signed)
Arthralgia  Your caregiver has diagnosed you as suffering from an arthralgia. Arthralgia means there is pain in a joint. This can come from many reasons including:  · Bruising the joint which causes soreness (inflammation) in the joint.  · Wear and tear on the joints which occur as we grow older (osteoarthritis).  · Overusing the joint.  · Various forms of arthritis.  · Infections of the joint.  Regardless of the cause of pain in your joint, most of these different pains respond to anti-inflammatory drugs and rest. The exception to this is when a joint is infected, and these cases are treated with antibiotics, if it is a bacterial infection.  HOME CARE INSTRUCTIONS   · Rest the injured area for as long as directed by your caregiver. Then slowly start using the joint as directed by your caregiver and as the pain allows. Crutches as directed may be useful if the ankles, knees or hips are involved. If the knee was splinted or casted, continue use and care as directed. If an stretchy or elastic wrapping bandage has been applied today, it should be removed and re-applied every 3 to 4 hours. It should not be applied tightly, but firmly enough to keep swelling down. Watch toes and feet for swelling, bluish discoloration, coldness, numbness or excessive pain. If any of these problems (symptoms) occur, remove the ace bandage and re-apply more loosely. If these symptoms persist, contact your caregiver or return to this location.  · For the first 24 hours, keep the injured extremity elevated on pillows while lying down.  · Apply ice for 15-20 minutes to the sore joint every couple hours while awake for the first half day. Then 03-04 times per day for the first 48 hours. Put the ice in a plastic bag and place a towel between the bag of ice and your skin.  · Wear any splinting, casting, elastic bandage applications, or slings as instructed.  · Only take over-the-counter or prescription medicines for pain, discomfort, or fever as  directed by your caregiver. Do not use aspirin immediately after the injury unless instructed by your physician. Aspirin can cause increased bleeding and bruising of the tissues.  · If you were given crutches, continue to use them as instructed and do not resume weight bearing on the sore joint until instructed.  Persistent pain and inability to use the sore joint as directed for more than 2 to 3 days are warning signs indicating that you should see a caregiver for a follow-up visit as soon as possible. Initially, a hairline fracture (break in bone) may not be evident on X-rays. Persistent pain and swelling indicate that further evaluation, non-weight bearing or use of the joint (use of crutches or slings as instructed), or further X-rays are indicated. X-rays may sometimes not show a small fracture until a week or 10 days later. Make a follow-up appointment with your own caregiver or one to whom we have referred you. A radiologist (specialist in reading X-rays) may read your X-rays. Make sure you know how you are to obtain your X-ray results. Do not assume everything is normal if you do not hear from us.  SEEK MEDICAL CARE IF:  Bruising, swelling, or pain increases.  SEEK IMMEDIATE MEDICAL CARE IF:   · Your fingers or toes are numb or blue.  · The pain is not responding to medications and continues to stay the same or get worse.  · The pain in your joint becomes severe.  · You   develop a fever over 102° F (38.9° C).  · It becomes impossible to move or use the joint.  MAKE SURE YOU:   · Understand these instructions.  · Will watch your condition.  · Will get help right away if you are not doing well or get worse.  Document Released: 06/17/2005 Document Revised: 09/09/2011 Document Reviewed: 02/03/2008  Exit[employer]® Patient Information ©2014 [employer].

## 2013-11-18 NOTE — ED Notes (Signed)
Pt reports left knee pain for several days. Denies injury. Can bear wt. Is a x 4

## 2013-11-18 NOTE — ED Notes (Signed)
Patient gone to xray 

## 2013-11-18 NOTE — ED Provider Notes (Signed)
CSN: [id number]     Arrival date & time 11/18/13  5784 History   First MD Initiated Contact with Patient 11/18/13 1012    This chart was scribed for Earney Navy, a non-physician practitioner working with No att. providers found by Denice Bors, ED Scribe. This patient was seen in room TR08C/TR08C and the patient's care was started at 9:44 AM     Chief Complaint  Patient presents with  . Knee Pain     (Consider location/radiation/quality/duration/timing/severity/associated sxs/prior Treatment) The history is provided by the patient. No language interpreter was used.   HPI Comments: Taylor Bowman is a 43 y.o. female who presents to the Emergency Department with PMHx of low back pain complaining of constant left knee pain onset several days. Describes pain as moderate in severity. Reports associated swelling. Reports pain is exacerbated with movement and weight bearing. Denies trying any alleviating factors. Denies associated recent injury, change in shoes, change in physical activity, change in jobs, standing extended period of times, fatigue, emesis, nausea, rash , fever, dysuria, lower leg pain, and upper leg pain. Denies any PMHx of knee pain.   Neurosurgeon MD Dr. Patrice Paradise. 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    Past Medical History  Diagnosis Date  . Hypertension    Past Surgical History  Procedure Laterality Date  . Back surgery    . Ectopic pregnancy surgery    . Tubal ligation     No family history on file. History  Substance Use Topics  . Smoking status: Never Smoker   . Smokeless tobacco: Not on file  . Alcohol Use: No   OB History   Grav Para Term Preterm Abortions TAB SAB Ect Mult Living                 Review of Systems  Constitutional: Negative for fever.  Musculoskeletal: Positive for arthralgias.      Allergies  Bee venom; Codeine; and Demerol  Home Medications   Prior to Admission medications   Medication Sig Start Date End Date  Taking? Authorizing Provider  amitriptyline (ELAVIL) 25 MG tablet Take 50 mg by mouth at bedtime.   Yes Historical Provider, MD  buPROPion (WELLBUTRIN XL) 300 MG 24 hr tablet Take 300 mg by mouth daily.   Yes Historical Provider, MD  clonazePAM (KLONOPIN) 1 MG tablet Take 1 mg by mouth 3 (three) times daily.   Yes Historical Provider, MD  cyclobenzaprine (FLEXERIL) 10 MG tablet Take 10 mg by mouth 3 (three) times daily.    Yes Historical Provider, MD  DULoxetine (CYMBALTA) 60 MG capsule Take 60 mg by mouth daily.   Yes Historical Provider, MD  gabapentin (NEURONTIN) 300 MG capsule Take 300 mg by mouth 3 (three) times daily.   Yes Historical Provider, MD  lisinopril-hydrochlorothiazide (PRINZIDE,ZESTORETIC) 10-12.5 MG per tablet Take 1 tablet by mouth daily.   Yes Historical Provider, MD  oxyCODONE-acetaminophen (PERCOCET) 10-325 MG per tablet Take 1 tablet by mouth every 4 (four) hours as needed for pain.   Yes Historical Provider, MD  zaleplon (SONATA) 5 MG capsule Take 5 mg by mouth at bedtime.   Yes Historical Provider, MD   BP 147/92  Pulse 71  Temp(Src) 98.3 F (36.8 C) (Oral)  Resp 18  SpO2 99% Physical Exam  Nursing note and vitals reviewed. Constitutional: She is oriented to person, place, and time. She appears well-developed and well-nourished. No distress.  HENT:  Head: Normocephalic and atraumatic.  Eyes: EOM are normal.  Neck:  Neck supple.  Cardiovascular: Normal rate.   Pulmonary/Chest: Effort normal. No respiratory distress.  Musculoskeletal: Normal range of motion. She exhibits no edema.  Bilateral knees:  Negative anterior and posterior drawer test. No pain with valgus or varus strain. Good and symmetrical strength. No clonus. No effusion. No induration. Normal sensation. Normal pulses. No skin changes, no rash. No deformities.     Neurological: She is alert and oriented to person, place, and time.  Skin: Skin is warm and dry. No rash noted. No erythema.  Psychiatric:  She has a normal mood and affect. Her behavior is normal.    ED Course  Procedures (including critical care time) COORDINATION OF CARE:  Nursing notes reviewed. Vital signs reviewed. Initial pt interview and examination performed.   Filed Vitals:   11/18/13 0928 11/18/13 1132  BP: 151/85 147/92  Pulse: 86 71  Temp: 98.3 F (36.8 C)   TempSrc: Oral   Resp: 18 18  SpO2: 99% 99%    9:44 AM-Discussed work up plan with pt at bedside, which includes  Orders Placed This Encounter  Procedures  . DG Knee Complete 4 Views Left    Standing Status: Standing     Number of Occurrences: 1     Standing Expiration Date:     Order Specific Question:  Reason for exam:    Answer:  KNEE PAIN  . Pt agrees with plan.   Treatment plan initiated: Medications  predniSONE (DELTASONE) tablet 60 mg (60 mg Oral Given 11/18/13 1131)     Imaging Review Dg Knee Complete 4 Views Left  11/18/2013   CLINICAL DATA:  Knee pain and swelling  EXAM: LEFT KNEE - COMPLETE 4+ VIEW  COMPARISON:  None.  FINDINGS: There is no evidence of fracture, dislocation, or joint effusion. There is no evidence of arthropathy or other focal bone abnormality. Soft tissues are unremarkable.  IMPRESSION: Negative.   Electronically Signed   By: Franchot Gallo M.D.   On: 11/18/2013 11:05   9:44 AM Nursing Notes Reviewed/ Care Coordinated Applicable Imaging Reviewed and incorporated into ED treatment Discussed results and treatment plan with pt. Pt demonstrates understanding and agrees with plan.   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 Will not be prescribing narcotic medication for home but shew as given a dose here.  43 y.o.Taylor Bowman  with back pain. No neurological deficits and normal neuro exam. Patient can walk but states is painful. No loss of bowel or bladder control. No concern for cauda equina. No fever, night sweats, weight loss,  h/o cancer, IVDU. RICE protocol and pain medicine indicated and discussed with patient.   Patient Plan 1. Medications: Prednisone and usual home medications  2. Treatment: rest, drink plenty of fluids, gentle stretching as discussed, alternate ice and heat  3. Follow Up: Please followup with your primary doctor for discussion of your diagnoses and further evaluation after today's visit; if you do not have a primary care doctor use the resource guide provided to find one   Vital signs are stable at discharge. Filed Vitals:   11/18/13 1132  BP: 147/92  Pulse: 71  Temp:   Resp: 18    Patient/guardian has voiced understanding and agreed to follow-up with the PCP or specialist.    I personally performed the services described in this documentation, which was scribed in my presence. The recorded information has been  reviewed and is accurate.     Linus Mako, PA-C 11/19/13 [phone]

## 2013-11-19 NOTE — ED Provider Notes (Signed)
Medical screening examination/treatment/procedure(s) were performed by non-physician practitioner and as supervising physician I was immediately available for consultation/collaboration.   EKG Interpretation None        Orpah Greek, MD 11/19/13 [phone]

## 2014-01-23 ENCOUNTER — Emergency Department (HOSPITAL_COMMUNITY)
Admission: EM | Admit: 2014-01-23 | Discharge: 2014-01-23 | Disposition: A | Discharge status: Home / Self Care | Payer: Medicare Other | Attending: Emergency Medicine | Admitting: Emergency Medicine

## 2014-01-23 ENCOUNTER — Encounter (HOSPITAL_COMMUNITY): Payer: Self-pay | Admitting: Emergency Medicine

## 2014-01-23 DIAGNOSIS — Z98811 Dental restoration status: Secondary | ICD-10-CM | POA: Diagnosis not present

## 2014-01-23 DIAGNOSIS — Z79899 Other long term (current) drug therapy: Secondary | ICD-10-CM | POA: Insufficient documentation

## 2014-01-23 DIAGNOSIS — K089 Disorder of teeth and supporting structures, unspecified: Secondary | ICD-10-CM | POA: Diagnosis present

## 2014-01-23 DIAGNOSIS — I10 Essential (primary) hypertension: Secondary | ICD-10-CM | POA: Diagnosis not present

## 2014-01-23 DIAGNOSIS — K08531 Fractured dental restorative material with loss of material: Secondary | ICD-10-CM | POA: Insufficient documentation

## 2014-01-23 DIAGNOSIS — R51 Headache: Secondary | ICD-10-CM | POA: Insufficient documentation

## 2014-01-23 DIAGNOSIS — IMO0002 Reserved for concepts with insufficient information to code with codable children: Secondary | ICD-10-CM | POA: Insufficient documentation

## 2014-01-23 MED ORDER — OXYCODONE-ACETAMINOPHEN 5-325 MG PO TABS: 1.0000 | ORAL_TABLET | ORAL | Status: DC | PRN

## 2014-01-23 MED ORDER — IBUPROFEN 400 MG PO TABS
800.0000 mg | ORAL_TABLET | Freq: Once | ORAL | Status: AC
  Administered 2014-01-23: 800 mg via ORAL
  Filled 2014-01-23: qty 2

## 2014-01-23 NOTE — ED Notes (Signed)
She states "the filling fell out of my tooth and now its hurting really bad."

## 2014-01-23 NOTE — ED Notes (Signed)
Declined W/C at D/C and was escorted to lobby by RN. 

## 2014-01-23 NOTE — Discharge Instructions (Signed)
You have a dental injury. Use the resource guide listed below to help you find a dentist if you do not already have one to followup with. It is very important that you get evaluated by a dentist as soon as possible. Call tomorrow to schedule an appointment. Use your pain medication as prescribed and do not operate heavy machinery while on pain medication. Note that your pain medication contains acetaminophen (Tylenol) & its is not reccommended that you use additional acetaminophen (Tylenol) while taking this medication. Take your full course of antibiotics. Read the instructions below.  Eat a soft or liquid diet and rinse your mouth out after meals with warm water. You should see a dentist or return here at once if you have increased swelling, increased pain or uncontrolled bleeding from the site of your injury.   SEEK MEDICAL CARE IF:   You have increased pain not controlled with medicines.   You have swelling around your tooth, in your face or neck.   You have bleeding which starts, continues, or gets worse.   You have a fever >101  If you are unable to open your mouth  RESOURCE GUIDE  Dental Problems  Patients with Medicaid: [facility]                     [facility] [address]                                           [address] Phone:  [phone]                                                  Phone:  [phone]  If unable to pay or uninsured, contact:  [facility] or [facility] to become qualified for the adult dental clinic.  Chronic Pain Problems Contact [facility]  297-2271 Patients need to be referred by their primary care doctor.  Insufficient Money for Medicine Contact [employer]:  call "211" or [facility] 271-5999.  No Primary Care Doctor Call [facility]  832-8000 Other agencies that provide inexpensive medical care    [facility]  832-8035    [facility]  832-7272    [facility]  [phone]    [facility] Clinic  832-4777    [facility]  [phone]    [facility]  272-1050  Psychological Services [facility]  832-9600 [facility]  [phone] [facility]   800 853-5163 (emergency services 641-4993)  Substance Abuse Resources [facility]  336-882-2125 [facility] 336-784-9470 [facility] 336-285-9073 [facility] 336-845-3988 Residential & Outpatient Substance Abuse Program  800-659-3381  Abuse/Neglect [place] Child Abuse Hotline (336) 641-3795 [place] Child Abuse Hotline 800-378-5315 (After Hours)  Emergency Shelter [facility] (336) 271-5985  [facility] (336) 275-9566 [facility] (704) 372-4663  MRSA Hotline #:   832-7006    [place] Resources  [facility] [place]     [employer]                            [facility] [address] [place]                       [address]      [address]  [place]                                                [place]                            [place] Phone:  [phone]                                   Phone:  [phone]                 Phone:  [phone]  [facility] Phone:  342-8316  [place] Child Abuse Hotline (336) 342-1394 (336) 342-3537 (After Hours)    

## 2014-01-23 NOTE — ED Provider Notes (Signed)
CSN: [id number]     Arrival date & time 01/23/14  1719 History  This chart was scribed for non-physician provider Margarita Mail, PA-C, working with Osvaldo Shipper, MD by Irene Pap, ED Scribe. This patient was seen in room TR04C/TR04C and patient care was started at 6:00 PM.    Chief Complaint  Patient presents with  . Dental Pain   Patient is a 43 y.o. female presenting with tooth pain. The history is provided by the patient. No language interpreter was used.  Dental Pain Location:  Lower Quality:  Shooting and sharp Onset quality:  Sudden Duration:  1 hour Timing:  Constant Progression:  Worsening Context: filling fell out   Ineffective treatments:  None tried Associated symptoms: headaches   Associated symptoms: no difficulty swallowing and no fever   Risk factors: no smoking   HPI Comments: Taylor Bowman is a 43 y.o. Female with a history of HTN who presents to the Emergency Department complaining of constant right lower dental pain onset one hour ago. She states that her filling came out of her tooth which started the sharp, shooting pain that has been getting worse. She states that she has not tried anything for the pain. She states that nothing makes the pain worse. She believes that she broke the tooth as well but does not remember doing any activity that could have broken it. She states that she had a headache prior but not related to the dental pain. She denies swelling and difficulty swallowing. She denies history of abscess in the mouth. She reports allergies to Demerol and Codeine and has swelling and hive reactions.She denies having a dentist. She denies smoking cigarettes.  Past Medical History  Diagnosis Date  . Hypertension    Past Surgical History  Procedure Laterality Date  . Back surgery    . Ectopic pregnancy surgery    . Tubal ligation     History reviewed. No pertinent family history. History  Substance Use Topics  . Smoking status: Never Smoker   .  Smokeless tobacco: Not on file  . Alcohol Use: No   OB History   Grav Para Term Preterm Abortions TAB SAB Ect Mult Living                 Review of Systems  Constitutional: Negative for fever and chills.  HENT: Positive for dental problem. Negative for trouble swallowing.   Neurological: Positive for headaches.   Allergies  Bee venom; Codeine; and Demerol  Home Medications   Prior to Admission medications   Medication Sig Start Date End Date Taking? Authorizing Provider  amitriptyline (ELAVIL) 25 MG tablet Take 50 mg by mouth at bedtime.    Historical Provider, MD  buPROPion (WELLBUTRIN XL) 300 MG 24 hr tablet Take 300 mg by mouth daily.    Historical Provider, MD  clonazePAM (KLONOPIN) 1 MG tablet Take 1 mg by mouth 3 (three) times daily.    Historical Provider, MD  cyclobenzaprine (FLEXERIL) 10 MG tablet Take 10 mg by mouth 3 (three) times daily.     Historical Provider, MD  DULoxetine (CYMBALTA) 60 MG capsule Take 60 mg by mouth daily.    Historical Provider, MD  gabapentin (NEURONTIN) 300 MG capsule Take 300 mg by mouth 3 (three) times daily.    Historical Provider, MD  lisinopril-hydrochlorothiazide (PRINZIDE,ZESTORETIC) 10-12.5 MG per tablet Take 1 tablet by mouth daily.    Historical Provider, MD  oxyCODONE-acetaminophen (PERCOCET) 10-325 MG per tablet Take 1 tablet by mouth every  4 (four) hours as needed for pain.    Historical Provider, MD  predniSONE (DELTASONE) 10 MG tablet Take 2 tablets (20 mg total) by mouth daily. 11/18/13   Tiffany Marilu Favre, PA-C  zaleplon (SONATA) 5 MG capsule Take 5 mg by mouth at bedtime.    Historical Provider, MD   BP 113/76  Pulse 92  Temp(Src) 98.3 F (36.8 C) (Oral)  Resp 18  SpO2 98% Physical Exam  Nursing note and vitals reviewed. Constitutional: She is oriented to person, place, and time. She appears well-developed and well-nourished.  HENT:  Head: Normocephalic and atraumatic.  No tongue swelling  Eyes: EOM are normal.  Neck:  Normal range of motion. Neck supple.  Cardiovascular: Normal rate.   Pulmonary/Chest: Effort normal.  Abdominal: Soft. There is no tenderness.  Musculoskeletal: Normal range of motion.  Neurological: She is alert and oriented to person, place, and time.  Skin: Skin is warm and dry.  Psychiatric: She has a normal mood and affect. Her behavior is normal.    ED Course  Dental Date/Time: 01/23/2014 6:18 PM Performed by: Margarita Mail Authorized by: Margarita Mail Consent: Verbal consent obtained. Risks and benefits: risks, benefits and alternatives were discussed Patient identity confirmed: verbally with patient Comments: Calcium hydroxide paste mixed and applied to area open cavity  Of tooth   (including critical care time) DIAGNOSTIC STUDIES: Oxygen Saturation is 98% on room air, normal by my interpretation.    COORDINATION OF CARE: 6:12 PM-Discussed treatment plan which includes dental block and pain medication with pt at bedside and pt agreed to plan.   Labs Review Labs Reviewed - No data to display  Imaging Review No results found.   EKG Interpretation None       Dental Performed by: Margarita Mail Authorized by: Margarita Mail Consent: Verbal consent obtained. Patient understanding: patient states understanding of the procedure being performed Patient identity confirmed: verbally with patient Time out was performed immediately  Local anesthesia used: yes Local anesthetic: bupivacaine 0.5% with epinephrine Anesthetic total: 1 ml Patient sedated: no Patient tolerance: Patient tolerated the procedure well with no immediate complications.    MDM   Final diagnoses:  Fractured dental restoration with loss of material    Patient  With loss of her dental filling. 45 min ago. Acute severe pain due to exposed pulp.  tooth was cleansed and calcium hydroxide applied.  Exam unconcerning for Ludwig's angina or spread of infection.  Will treat with pain medicine.   Urged patient to follow-up with dentist.     I personally performed the services described in this documentation, which was scribed in my presence. The recorded information has been reviewed and is accurate.    Margarita Mail, PA-C 01/23/14 Taylor Bowman

## 2014-01-24 NOTE — ED Provider Notes (Signed)
Medical screening examination/treatment/procedure(s) were performed by non-physician practitioner and as supervising physician I was immediately available for consultation/collaboration.   EKG Interpretation None        Osvaldo Shipper, MD 01/24/14 [phone]

## 2014-02-24 ENCOUNTER — Emergency Department (HOSPITAL_COMMUNITY): Payer: Medicare Other

## 2014-02-24 ENCOUNTER — Emergency Department (HOSPITAL_COMMUNITY)
Admission: EM | Admit: 2014-02-24 | Discharge: 2014-02-24 | Disposition: A | Discharge status: Home / Self Care | Payer: Medicare Other | Attending: Emergency Medicine | Admitting: Emergency Medicine

## 2014-02-24 ENCOUNTER — Encounter (HOSPITAL_COMMUNITY): Payer: Self-pay | Admitting: Emergency Medicine

## 2014-02-24 DIAGNOSIS — M549 Dorsalgia, unspecified: Secondary | ICD-10-CM | POA: Diagnosis present

## 2014-02-24 DIAGNOSIS — M542 Cervicalgia: Secondary | ICD-10-CM | POA: Diagnosis not present

## 2014-02-24 DIAGNOSIS — I10 Essential (primary) hypertension: Secondary | ICD-10-CM | POA: Diagnosis not present

## 2014-02-24 DIAGNOSIS — Z9889 Other specified postprocedural states: Secondary | ICD-10-CM | POA: Diagnosis not present

## 2014-02-24 DIAGNOSIS — Z79899 Other long term (current) drug therapy: Secondary | ICD-10-CM | POA: Diagnosis not present

## 2014-02-24 DIAGNOSIS — M546 Pain in thoracic spine: Secondary | ICD-10-CM | POA: Insufficient documentation

## 2014-02-24 HISTORY — DX: Dorsalgia, unspecified: M54.9

## 2014-02-24 MED ORDER — DIPHENHYDRAMINE HCL 50 MG/ML IJ SOLN
12.5000 mg | Freq: Once | INTRAMUSCULAR | Status: AC
  Administered 2014-02-24: 12.5 mg via INTRAVENOUS
  Filled 2014-02-24: qty 1

## 2014-02-24 MED ORDER — SODIUM CHLORIDE 0.9 % IV SOLN
Freq: Once | INTRAVENOUS | Status: AC
  Administered 2014-02-24: 09:00:00 via INTRAVENOUS

## 2014-02-24 MED ORDER — KETOROLAC TROMETHAMINE 30 MG/ML IJ SOLN
30.0000 mg | Freq: Once | INTRAMUSCULAR | Status: AC
  Administered 2014-02-24: 30 mg via INTRAVENOUS
  Filled 2014-02-24: qty 1

## 2014-02-24 MED ORDER — MORPHINE SULFATE 4 MG/ML IJ SOLN
4.0000 mg | Freq: Once | INTRAMUSCULAR | Status: AC
  Administered 2014-02-24: 4 mg via INTRAVENOUS
  Filled 2014-02-24: qty 1

## 2014-02-24 MED ORDER — BACLOFEN 10 MG PO TABS: 10.0000 mg | ORAL_TABLET | Freq: Three times a day (TID) | ORAL | Status: DC

## 2014-02-24 NOTE — ED Notes (Signed)
Pt back from x-ray. Pt. Hooked back up to BP and Pulse ox.

## 2014-02-24 NOTE — ED Notes (Signed)
Pts IV removed and vital signs updated. Pt is getting dressed and awaiting discharge paperwork at bedside.

## 2014-02-24 NOTE — ED Notes (Addendum)
Pt. Has a hx of lower back pain and is being treated for this..  Takes pain medication daily.   In the last week the pain had increased to the middle of her back,  Between her shoulder blades.   Deep breath increases pain so does movement.  Pt. Is also having increased numbness on her lt. Leg (She has numbness all the time).  Pt. Drove today.  Pt. Is requesting to have an MRI.

## 2014-02-24 NOTE — ED Notes (Signed)
Pt. s daughter picked up pt. For discharge. She will be driving pt. home

## 2014-02-24 NOTE — Discharge Instructions (Signed)
Heat Therapy °Heat therapy can help ease sore, stiff, injured, and tight muscles and joints. Heat relaxes your muscles, which may help ease your pain.  °RISKS AND COMPLICATIONS °If you have any of the following conditions, do not use heat therapy unless your health care provider has approved: °· Poor circulation. °· Healing wounds or scarred skin in the area being treated. °· Diabetes, heart disease, or high blood pressure. °· Not being able to feel (numbness) the area being treated. °· Unusual swelling of the area being treated. °· Active infections. °· Blood clots. °· Cancer. °· Inability to communicate pain. This may include young children and people who have problems with their brain function (dementia). °· Pregnancy. °Heat therapy should only be used on old, pre-existing, or long-lasting (chronic) injuries. Do not use heat therapy on new injuries unless directed by your health care provider. °HOW TO USE HEAT THERAPY °There are several different kinds of heat therapy, including: °· Moist heat pack. °· Warm water bath. °· Hot water bottle. °· Electric heating pad. °· Heated gel pack. °· Heated wrap. °· Electric heating pad. °Use the heat therapy method suggested by your health care provider. Follow your health care provider's instructions on when and how to use heat therapy. °GENERAL HEAT THERAPY RECOMMENDATIONS °· Do not sleep while using heat therapy. Only use heat therapy while you are awake. °· Your skin may turn pink while using heat therapy. Do not use heat therapy if your skin turns red. °· Do not use heat therapy if you have new pain. °· High heat or long exposure to heat can cause burns. Be careful when using heat therapy to avoid burning your skin. °· Do not use heat therapy on areas of your skin that are already irritated, such as with a rash or sunburn. °SEEK MEDICAL CARE IF: °· You have blisters, redness, swelling, or numbness. °· You have new pain. °· Your pain is worse. °MAKE SURE  YOU: °· Understand these instructions. °· Will watch your condition. °· Will get help right away if you are not doing well or get worse. °Document Released: 09/09/2011 Document Revised: 11/01/2013 Document Reviewed: 08/10/2013 °[employer]® Patient Information ©2015 [employer]. This information is not intended to replace advice given to you by your health care provider. Make sure you discuss any questions you have with your health care provider. °Muscle Pain °Muscle pain (myalgia) may be caused by many things, including: °· Overuse or muscle strain, especially if you are not in shape. This is the most common cause of muscle pain. °· Injury. °· Bruises. °· Viruses, such as the flu. °· Infectious diseases. °· Fibromyalgia, which is a chronic condition that causes muscle tenderness, fatigue, and headache. °· Autoimmune diseases, including lupus. °· Certain drugs, including ACE inhibitors and statins. °Muscle pain may be mild or severe. In most cases, the pain lasts only a short time and goes away without treatment. To diagnose the cause of your muscle pain, your health care provider will take your medical history. This means he or she will ask you when your muscle pain began and what has been happening. If you have not had muscle pain for very long, your health care provider may want to wait before doing much testing. If your muscle pain has lasted a long time, your health care provider may want to run tests right away. If your health care provider thinks your muscle pain may be caused by illness, you may need to have additional tests to rule out certain   conditions.  °Treatment for muscle pain depends on the cause. Home care is often enough to relieve muscle pain. Your health care provider may also prescribe anti-inflammatory medicine. °HOME CARE INSTRUCTIONS °Watch your condition for any changes. The following actions may help to lessen any discomfort you are feeling: °· Only take over-the-counter or prescription  medicines as directed by your health care provider. °· Apply ice to the sore muscle: °· Put ice in a plastic bag. °· Place a towel between your skin and the bag. °· Leave the ice on for 15-20 minutes, 3-4 times a day. °· You may alternate applying hot and cold packs to the muscle as directed by your health care provider. °· If overuse is causing your muscle pain, slow down your activities until the pain goes away. °· Remember that it is normal to feel some muscle pain after starting a workout program. Muscles that have not been used often will be sore at first. °· Do regular, gentle exercises if you are not usually active. °· Warm up before exercising to lower your risk of muscle pain. °· Do not continue working out if the pain is very bad. Bad pain could mean you have injured a muscle. °SEEK MEDICAL CARE IF: °· Your muscle pain gets worse, and medicines do not help. °· You have muscle pain that lasts longer than 3 days. °· You have a rash or fever along with muscle pain. °· You have muscle pain after a tick bite. °· You have muscle pain while working out, even though you are in good physical condition. °· You have redness, soreness, or swelling along with muscle pain. °· You have muscle pain after starting a new medicine or changing the dose of a medicine. °SEEK IMMEDIATE MEDICAL CARE IF: °· You have trouble breathing. °· You have trouble swallowing. °· You have muscle pain along with a stiff neck, fever, and vomiting. °· You have severe muscle weakness or cannot move part of your body. °MAKE SURE YOU:  °· Understand these instructions. °· Will watch your condition. °· Will get help right away if you are not doing well or get worse. °Document Released: 05/09/2006 Document Revised: 06/22/2013 Document Reviewed: 04/13/2013 °ExitCare® Patient Information ©2015 [employer]. This information is not intended to replace advice given to you by your health care provider. Make sure you discuss any questions you have with  your health care provider. ° °

## 2014-02-24 NOTE — ED Notes (Signed)
Pt. Received Morhphine IV and within 5 minutes developed 2 hives at the IV site.  Pt. Reports very itchy.  Airway intact,  Pt. Denies any feeling of itchy throat.  No swelling noted to throat. Reported to [facility], NP, orders received

## 2014-02-24 NOTE — ED Provider Notes (Signed)
Medical screening examination/treatment/procedure(s) were performed by non-physician practitioner and as supervising physician I was immediately available for consultation/collaboration.   EKG Interpretation None        Wandra Arthurs, MD 02/24/14 1556

## 2014-02-24 NOTE — ED Notes (Signed)
Sherri, NP is in the room discussing pt.s plan of care.

## 2014-02-24 NOTE — ED Provider Notes (Signed)
CSN: [id number]     Arrival date & time 02/24/14  4268 History   First MD Initiated Contact with Patient 02/24/14 [phone]     Chief Complaint  Patient presents with  . Back Pain     (Consider location/radiation/quality/duration/timing/severity/associated sxs/prior Treatment) Patient is a 43 y.o. female presenting with back pain. The history is provided by the patient. No language interpreter was used.  Back Pain Location:  Thoracic spine Radiates to:  R shoulder Pain severity:  Moderate Pain is:  Same all the time Onset quality:  Gradual Duration:  1 week Timing:  Constant Progression:  Worsening Ineffective treatments:  Narcotics Associated symptoms: no abdominal pain, no chest pain, no fever, no numbness and no weakness   Associated symptoms comment:  Sharp pain to the right parathoracic back that radiates to the right neck and right upper extremity for the past week. No SOB, cough or fever. It does not radiate to chest. No flank or abdominal pain, nausea or vomiting. She has chronic lower back pain secondary to multi-level disc disease, per patient, treated through Pain Management with Percocet 10mg /325mg  and has taken this without relief of her pain. She is requesting an MRI of her thoracic spine.   Past Medical History  Diagnosis Date  . Hypertension   . Back pain    Past Surgical History  Procedure Laterality Date  . Back surgery    . Ectopic pregnancy surgery    . Tubal ligation     History reviewed. No pertinent family history. History  Substance Use Topics  . Smoking status: Never Smoker   . Smokeless tobacco: Not on file  . Alcohol Use: No   OB History   Grav Para Term Preterm Abortions TAB SAB Ect Mult Living                 Review of Systems  Constitutional: Negative for fever and chills.  Respiratory: Negative.   Cardiovascular: Negative.  Negative for chest pain.  Gastrointestinal: Negative.  Negative for nausea, vomiting and abdominal pain.   Genitourinary: Negative.  Negative for flank pain.  Musculoskeletal: Positive for back pain and neck pain.       See HPI.  Skin: Negative.   Neurological: Negative.  Negative for weakness and numbness.      Allergies  Bee venom; Codeine; and Demerol  Home Medications   Prior to Admission medications   Medication Sig Start Date End Date Taking? Authorizing Provider  amitriptyline (ELAVIL) 25 MG tablet Take 50 mg by mouth at bedtime.   Yes Historical Provider, MD  buPROPion (WELLBUTRIN XL) 300 MG 24 hr tablet Take 300 mg by mouth daily.   Yes Historical Provider, MD  clonazePAM (KLONOPIN) 1 MG tablet Take 1 mg by mouth 3 (three) times daily.   Yes Historical Provider, MD  cyclobenzaprine (FLEXERIL) 10 MG tablet Take 10 mg by mouth 3 (three) times daily.    Yes Historical Provider, MD  DULoxetine (CYMBALTA) 60 MG capsule Take 60 mg by mouth daily.   Yes Historical Provider, MD  gabapentin (NEURONTIN) 300 MG capsule Take 300 mg by mouth 3 (three) times daily.   Yes Historical Provider, MD  lisinopril-hydrochlorothiazide (PRINZIDE,ZESTORETIC) 10-12.5 MG per tablet Take 1 tablet by mouth daily.   Yes Historical Provider, MD  oxyCODONE-acetaminophen (PERCOCET) 10-325 MG per tablet Take 1 tablet by mouth every 4 (four) hours as needed for pain.   Yes Historical Provider, MD  temazepam (RESTORIL) 30 MG capsule Take 30 mg by mouth at  bedtime. 02/10/14  Yes Historical Provider, MD  zaleplon (SONATA) 5 MG capsule Take 5 mg by mouth at bedtime.   Yes Historical Provider, MD   BP 133/99  Pulse 90  Temp(Src) 98.1 F (36.7 C)  Resp 18  Ht 5\' 6"  (1.676 m)  Wt 169 lb (76.658 kg)  BMI 27.29 kg/m2  SpO2 97%  LMP 02/14/2014 Physical Exam  Constitutional: She is oriented to person, place, and time. She appears well-developed and well-nourished.  HENT:  Head: Normocephalic.  Neck: Normal range of motion. Neck supple.  Cardiovascular: Normal rate and regular rhythm.   Pulmonary/Chest: Effort  normal and breath sounds normal.  Abdominal: Soft. Bowel sounds are normal. There is no tenderness. There is no rebound and no guarding.  Musculoskeletal: Normal range of motion.  Right lower para-thoracic tenderness without swelling or discoloration. Tenderness extends to right paracervical and scapular regions. No swelling. FROM UE's. No strength deficits.   Neurological: She is alert and oriented to person, place, and time.  No strength deficits. CN's grossly intact.    Skin: Skin is warm and dry. No rash noted.  Psychiatric: She has a normal mood and affect.    ED Course  Procedures (including critical care time) Labs Review Labs Reviewed - No data to display  Imaging Review No results found.   EKG Interpretation None     Dg Chest 2 View  02/24/2014   CLINICAL DATA:  Chest wall pain.  No injury.  EXAM: CHEST  2 VIEW  COMPARISON:  12/01/2007  FINDINGS: The heart size and mediastinal contours are within normal limits. Both lungs are clear. The visualized skeletal structures are unremarkable.  IMPRESSION: No active cardiopulmonary disease.   Electronically Signed   By: Lajean Manes M.D.   On: 02/24/2014 09:21   Dg Thoracic Spine 2 View  02/24/2014   CLINICAL DATA:  Posterior chest wall pain.  No injury.  EXAM: THORACIC SPINE - 2 VIEW  COMPARISON:  Lateral chest radiograph, 10/18/2007  FINDINGS: No fracture or spondylolisthesis. Mild curvature of the mid thoracic spine, convex to the right, stable. There are mild degenerative changes along the thoracic spine most evident in the mid thoracic spine with mild loss of disc height and small endplate osteophytes. No bone lesion. Soft tissues are unremarkable.  IMPRESSION: No fracture or acute finding. Mild dextroscoliosis and degenerative changes.   Electronically Signed   By: Lajean Manes M.D.   On: 02/24/2014 09:22   MDM   Final diagnoses:  None    1. Thoracic back pain  She is non-toxic in appearance. PERC negative - doubt PE. No  PNA. Back pain that is worse with movement - some better with medications here. VSS. Stable for discharge.     Dewaine Oats, PA-C 02/24/14 1041

## 2014-04-01 ENCOUNTER — Ambulatory Visit (HOSPITAL_COMMUNITY)
Admission: RE | Admit: 2014-04-01 | Discharge: 2014-04-01 | Disposition: A | Discharge status: Home / Self Care | Payer: Medicare Other | Source: Ambulatory Visit | Attending: Interventional Radiology | Admitting: Interventional Radiology

## 2014-04-01 ENCOUNTER — Other Ambulatory Visit (HOSPITAL_COMMUNITY): Payer: Self-pay | Admitting: Physical Medicine and Rehabilitation

## 2014-04-01 DIAGNOSIS — M25561 Pain in right knee: Secondary | ICD-10-CM | POA: Insufficient documentation

## 2014-04-01 DIAGNOSIS — R52 Pain, unspecified: Secondary | ICD-10-CM

## 2014-04-15 ENCOUNTER — Emergency Department (HOSPITAL_COMMUNITY)
Admission: EM | Admit: 2014-04-15 | Discharge: 2014-04-15 | Disposition: A | Discharge status: Home / Self Care | Payer: Medicare Other | Attending: Emergency Medicine | Admitting: Emergency Medicine

## 2014-04-15 ENCOUNTER — Emergency Department (HOSPITAL_COMMUNITY): Payer: Medicare Other

## 2014-04-15 ENCOUNTER — Encounter (HOSPITAL_COMMUNITY): Payer: Self-pay | Admitting: Emergency Medicine

## 2014-04-15 DIAGNOSIS — R102 Pelvic and perineal pain: Secondary | ICD-10-CM | POA: Diagnosis not present

## 2014-04-15 DIAGNOSIS — Z9851 Tubal ligation status: Secondary | ICD-10-CM | POA: Insufficient documentation

## 2014-04-15 DIAGNOSIS — R1031 Right lower quadrant pain: Secondary | ICD-10-CM | POA: Diagnosis not present

## 2014-04-15 DIAGNOSIS — N739 Female pelvic inflammatory disease, unspecified: Secondary | ICD-10-CM | POA: Insufficient documentation

## 2014-04-15 DIAGNOSIS — N730 Acute parametritis and pelvic cellulitis: Secondary | ICD-10-CM

## 2014-04-15 DIAGNOSIS — Z79899 Other long term (current) drug therapy: Secondary | ICD-10-CM | POA: Diagnosis not present

## 2014-04-15 DIAGNOSIS — I10 Essential (primary) hypertension: Secondary | ICD-10-CM | POA: Diagnosis not present

## 2014-04-15 HISTORY — DX: Leiomyoma of uterus, unspecified: D25.9

## 2014-04-15 LAB — COMPREHENSIVE METABOLIC PANEL
ALBUMIN: 3.5 g/dL (ref 3.5–5.2)
ALT: 11 U/L (ref 0–35)
AST: 38 U/L — ABNORMAL HIGH (ref 0–37)
Alkaline Phosphatase: 42 U/L (ref 39–117)
Anion gap: 13 (ref 5–15)
BILIRUBIN TOTAL: 0.4 mg/dL (ref 0.3–1.2)
BUN: 18 mg/dL (ref 6–23)
CHLORIDE: 102 meq/L (ref 96–112)
CO2: 25 mEq/L (ref 19–32)
CREATININE: 1.03 mg/dL (ref 0.50–1.10)
Calcium: 9.4 mg/dL (ref 8.4–10.5)
GFR calc Af Amer: 75 mL/min — ABNORMAL LOW (ref >90)
GFR, EST NON AFRICAN AMERICAN: 65 mL/min — AB (ref >90)
Glucose, Bld: 131 mg/dL — ABNORMAL HIGH (ref 70–99)
Potassium: 4.9 mEq/L (ref 3.7–5.3)
Sodium: 140 mEq/L (ref 137–147)
Total Protein: 7.9 g/dL (ref 6.0–8.3)

## 2014-04-15 LAB — CBC WITH DIFFERENTIAL/PLATELET
BASOS PCT: 1 % (ref 0–1)
Basophils Absolute: 0.1 10*3/uL (ref 0.0–0.1)
Eosinophils Absolute: 0.3 10*3/uL (ref 0.0–0.7)
Eosinophils Relative: 3 % (ref 0–5)
HCT: 38.3 % (ref 36.0–46.0)
HEMOGLOBIN: 12.8 g/dL (ref 12.0–15.0)
Lymphocytes Relative: 34 % (ref 12–46)
Lymphs Abs: 2.9 10*3/uL (ref 0.7–4.0)
MCH: 28.1 pg (ref 26.0–34.0)
MCHC: 33.4 g/dL (ref 30.0–36.0)
MCV: 84.2 fL (ref 78.0–100.0)
Monocytes Absolute: 0.6 10*3/uL (ref 0.1–1.0)
Monocytes Relative: 6 % (ref 3–12)
NEUTROS ABS: 4.9 10*3/uL (ref 1.7–7.7)
Neutrophils Relative %: 56 % (ref 43–77)
Platelets: 316 10*3/uL (ref 150–400)
RBC: 4.55 MIL/uL (ref 3.87–5.11)
RDW: 12.8 % (ref 11.5–15.5)
WBC: 8.7 10*3/uL (ref 4.0–10.5)

## 2014-04-15 LAB — URINALYSIS, ROUTINE W REFLEX MICROSCOPIC
BILIRUBIN URINE: NEGATIVE
GLUCOSE, UA: NEGATIVE mg/dL
HGB URINE DIPSTICK: NEGATIVE
KETONES UR: NEGATIVE mg/dL
Leukocytes, UA: NEGATIVE
Nitrite: NEGATIVE
PROTEIN: 100 mg/dL — AB
Specific Gravity, Urine: 1.021 (ref 1.005–1.030)
Urobilinogen, UA: 0.2 mg/dL (ref 0.0–1.0)
pH: 5.5 (ref 5.0–8.0)

## 2014-04-15 LAB — WET PREP, GENITAL
CLUE CELLS WET PREP: NONE SEEN
Trich, Wet Prep: NONE SEEN

## 2014-04-15 LAB — URINE MICROSCOPIC-ADD ON

## 2014-04-15 LAB — POC URINE PREG, ED: PREG TEST UR: NEGATIVE

## 2014-04-15 LAB — LIPASE, BLOOD: LIPASE: 32 U/L (ref 11–59)

## 2014-04-15 MED ORDER — ONDANSETRON HCL 4 MG/2ML IJ SOLN
4.0000 mg | Freq: Once | INTRAMUSCULAR | Status: AC
  Administered 2014-04-15: 4 mg via INTRAVENOUS
  Filled 2014-04-15: qty 2

## 2014-04-15 MED ORDER — AZITHROMYCIN 250 MG PO TABS: 1000.0000 mg | ORAL_TABLET | Freq: Once | ORAL | Status: DC

## 2014-04-15 MED ORDER — LIDOCAINE HCL (PF) 1 % IJ SOLN
INTRAMUSCULAR | Status: AC
  Administered 2014-04-15: 1 mL
  Filled 2014-04-15: qty 5

## 2014-04-15 MED ORDER — MORPHINE SULFATE 4 MG/ML IJ SOLN
4.0000 mg | Freq: Once | INTRAMUSCULAR | Status: DC
Start: 2014-04-15 — End: 2014-04-15
  Filled 2014-04-15: qty 1

## 2014-04-15 MED ORDER — SODIUM CHLORIDE 0.9 % IV SOLN
1000.0000 mL | INTRAVENOUS | Status: DC
  Administered 2014-04-15: 1000 mL via INTRAVENOUS

## 2014-04-15 MED ORDER — SODIUM CHLORIDE 0.9 % IV SOLN
1000.0000 mL | Freq: Once | INTRAVENOUS | Status: AC
  Administered 2014-04-15: 1000 mL via INTRAVENOUS

## 2014-04-15 MED ORDER — FENTANYL CITRATE 0.05 MG/ML IJ SOLN
50.0000 ug | Freq: Once | INTRAMUSCULAR | Status: AC
  Administered 2014-04-15: 50 ug via INTRAVENOUS
  Filled 2014-04-15: qty 2

## 2014-04-15 MED ORDER — IOHEXOL 300 MG/ML  SOLN
25.0000 mL | Freq: Once | INTRAMUSCULAR | Status: AC | PRN
  Administered 2014-04-15: 25 mL via ORAL

## 2014-04-15 MED ORDER — CEFTRIAXONE SODIUM 250 MG IJ SOLR
250.0000 mg | Freq: Once | INTRAMUSCULAR | Status: AC
  Administered 2014-04-15: 250 mg via INTRAMUSCULAR
  Filled 2014-04-15: qty 250

## 2014-04-15 MED ORDER — IOHEXOL 300 MG/ML  SOLN
100.0000 mL | Freq: Once | INTRAMUSCULAR | Status: AC | PRN
  Administered 2014-04-15: 100 mL via INTRAVENOUS

## 2014-04-15 MED ORDER — DOXYCYCLINE HYCLATE 100 MG PO CAPS: 100.0000 mg | ORAL_CAPSULE | Freq: Two times a day (BID) | ORAL | Status: DC

## 2014-04-15 NOTE — ED Provider Notes (Signed)
Discussed case with attending physician Dr. Eliane Decree - transfer of care to this provider at change in shift.   Taylor Bowman is a 43 y/o F with PMhx of hypertension, back pain, uterine fibroids, ectopic pregnancy presenting to the emergency department with abdominal pain localized to the right lower quadrant that has been ongoing since 12:00AM last night. Patient reported that it is a sharp, stabbing pain that is constant without radiation. Reported that it feels similar to her ectopic pregnancy that she had 8-10 years ago. Reported that she has been feeling mild nauseous without episodes of emesis. Reported that she has been taking Oxycodone with minimal relief. Stated that she is sexually active and uses protection.Denied fever, vomiting, diarrhea, melena, hematochezia, chest pain, shortness of breath, difficulty breathing, vaginal discharge, vaginal bleeding.  Patient appears comfortable. Heart rate and rhythm normal. Lungs clear to auscultation to upper lower lobes bilaterally. Abdomen with negative distention. Bowel sounds normal active in all 4 quadrants. Abdomen soft upon palpation. Negative rigidity or peritoneal signs noted. Pelvic exam performed with negative erythema, inflammation, lesions, sores, polyps, and growths noted to the external genitalia and vaginal canal. Negative blood in the vaginal fold. Negative discharge identified. Cervix noted with negative friability. Negative CMT. Negative left adnexal tenderness. Right adnexal tenderness present. Exam chaperoned with tech.  Results for orders placed during the hospital encounter of 04/15/14  URINALYSIS, ROUTINE W REFLEX MICROSCOPIC      Result Value Ref Range   Color, Urine YELLOW  YELLOW   APPearance CLEAR  CLEAR   Specific Gravity, Urine 1.021  1.005 - 1.030   pH 5.5  5.0 - 8.0   Glucose, UA NEGATIVE  NEGATIVE mg/dL   Hgb urine dipstick NEGATIVE  NEGATIVE   Bilirubin Urine NEGATIVE  NEGATIVE   Ketones, ur NEGATIVE  NEGATIVE mg/dL    Protein, ur 100 (*) NEGATIVE mg/dL   Urobilinogen, UA 0.2  0.0 - 1.0 mg/dL   Nitrite NEGATIVE  NEGATIVE   Leukocytes, UA NEGATIVE  NEGATIVE  CBC WITH DIFFERENTIAL      Result Value Ref Range   WBC 8.7  4.0 - 10.5 K/uL   RBC 4.55  3.87 - 5.11 MIL/uL   Hemoglobin 12.8  12.0 - 15.0 g/dL   HCT 38.3  36.0 - 46.0 %   MCV 84.2  78.0 - 100.0 fL   MCH 28.1  26.0 - 34.0 pg   MCHC 33.4  30.0 - 36.0 g/dL   RDW 12.8  11.5 - 15.5 %   Platelets 316  150 - 400 K/uL   Neutrophils Relative % 56  43 - 77 %   Neutro Abs 4.9  1.7 - 7.7 K/uL   Lymphocytes Relative 34  12 - 46 %   Lymphs Abs 2.9  0.7 - 4.0 K/uL   Monocytes Relative 6  3 - 12 %   Monocytes Absolute 0.6  0.1 - 1.0 K/uL   Eosinophils Relative 3  0 - 5 %   Eosinophils Absolute 0.3  0.0 - 0.7 K/uL   Basophils Relative 1  0 - 1 %   Basophils Absolute 0.1  0.0 - 0.1 K/uL  COMPREHENSIVE METABOLIC PANEL      Result Value Ref Range   Sodium 140  137 - 147 mEq/L   Potassium 4.9  3.7 - 5.3 mEq/L   Chloride 102  96 - 112 mEq/L   CO2 25  19 - 32 mEq/L   Glucose, Bld 131 (*) 70 - 99 mg/dL  BUN 18  6 - 23 mg/dL   Creatinine, Ser 1.03  0.50 - 1.10 mg/dL   Calcium 9.4  8.4 - 10.5 mg/dL   Total Protein 7.9  6.0 - 8.3 g/dL   Albumin 3.5  3.5 - 5.2 g/dL   AST 38 (*) 0 - 37 U/L   ALT 11  0 - 35 U/L   Alkaline Phosphatase 42  39 - 117 U/L   Total Bilirubin 0.4  0.3 - 1.2 mg/dL   GFR calc non Af Amer 65 (*) >90 mL/min   GFR calc Af Amer 75 (*) >90 mL/min   Anion gap 13  5 - 15  LIPASE, BLOOD      Result Value Ref Range   Lipase 32  11 - 59 U/L  URINE MICROSCOPIC-ADD ON      Result Value Ref Range   Squamous Epithelial / LPF RARE  RARE   WBC, UA 0-2  <3 WBC/hpf   RBC / HPF 0-2  <3 RBC/hpf   Casts HYALINE CASTS (*) NEGATIVE  POC URINE PREG, ED      Result Value Ref Range   Preg Test, Ur NEGATIVE  NEGATIVE   US Transvaginal Non-ob  04/15/2014   CLINICAL DATA:  Abdominal pain, right lower quadrant. Evaluate for ovarian torsion.  EXAM:  TRANSABDOMINAL AND TRANSVAGINAL ULTRASOUND OF PELVIS  DOPPLER ULTRASOUND OF OVARIES  TECHNIQUE: Both transabdominal and transvaginal ultrasound examinations of the pelvis were performed. Transabdominal technique was performed for global imaging of the pelvis including uterus, ovaries, adnexal regions, and pelvic cul-de-sac.  It was necessary to proceed with endovaginal exam following the transabdominal exam to visualize the ovaries. Color and duplex Doppler ultrasound was utilized to evaluate blood flow to the ovaries.  COMPARISON:  09/21/2010  FINDINGS: Uterus  Measurements: 11 x 5 x 8 cm. The echotexture is diffusely heterogeneous and there are multiple areas of masslike shadowing consistent with fibroids. The largest and most discrete is in the fundic myometrium measuring 3.5 cm.  Endometrium  Thickness: 4 mm. No distortion of the endometrium by the above described fibroids.  Right ovary  Measurements: 2.8 x 1.9 x 1.8 cm. Normal appearance/no adnexal mass.  Left ovary  Measurements: 3.9 x 1.9 x 2 cm. Dominant follicle present.  Pulsed Doppler evaluation of both ovaries demonstrates normal low-resistance arterial and venous waveforms. Lower amplitude waveforms on the right is likely technical; diastolic flow still noted within the right ovary.  Other findings  No free fluid.  IMPRESSION: 1. No evidence for ovarian torsion. 2. Fibroid uterus, the largest measuring 3.5 cm.   Electronically Signed   By: Jorje Guild M.D.   On: 04/15/2014 06:19   US Pelvis Complete  04/15/2014   CLINICAL DATA:  Abdominal pain, right lower quadrant. Evaluate for ovarian torsion.  EXAM: TRANSABDOMINAL AND TRANSVAGINAL ULTRASOUND OF PELVIS  DOPPLER ULTRASOUND OF OVARIES  TECHNIQUE: Both transabdominal and transvaginal ultrasound examinations of the pelvis were performed. Transabdominal technique was performed for global imaging of the pelvis including uterus, ovaries, adnexal regions, and pelvic cul-de-sac.  It was necessary to  proceed with endovaginal exam following the transabdominal exam to visualize the ovaries. Color and duplex Doppler ultrasound was utilized to evaluate blood flow to the ovaries.  COMPARISON:  09/21/2010  FINDINGS: Uterus  Measurements: 11 x 5 x 8 cm. The echotexture is diffusely heterogeneous and there are multiple areas of masslike shadowing consistent with fibroids. The largest and most discrete is in the fundic myometrium measuring 3.5 cm.  Endometrium  Thickness: 4 mm. No distortion of the endometrium by the above described fibroids.  Right ovary  Measurements: 2.8 x 1.9 x 1.8 cm. Normal appearance/no adnexal mass.  Left ovary  Measurements: 3.9 x 1.9 x 2 cm. Dominant follicle present.  Pulsed Doppler evaluation of both ovaries demonstrates normal low-resistance arterial and venous waveforms. Lower amplitude waveforms on the right is likely technical; diastolic flow still noted within the right ovary.  Other findings  No free fluid.  IMPRESSION: 1. No evidence for ovarian torsion. 2. Fibroid uterus, the largest measuring 3.5 cm.   Electronically Signed   By: Jorje Guild M.D.   On: 04/15/2014 06:19   Ct Abdomen Pelvis W Contrast  04/15/2014   CLINICAL DATA:  Right lower quadrant abdominal pain, nausea and vomiting. Acute onset  EXAM: CT ABDOMEN AND PELVIS WITH CONTRAST  TECHNIQUE: Multidetector CT imaging of the abdomen and pelvis was performed using the standard protocol following bolus administration of intravenous contrast.  CONTRAST:  198mL OMNIPAQUE IOHEXOL 300 MG/ML  SOLN  COMPARISON:  None.  FINDINGS: Lower chest:  Lung bases are clear.  No pericardial fluid.  Hepatobiliary: No focal hepatic lesion.  Gallbladder is normal.  Pancreas: Pancreas is normal. No duct dilatation. No pancreatic inflammation.  Spleen: Normal spleen.  Adrenals/urinary tract: Normal adrenal glands. Normal kidneys, ureter, and bladder.  Stomach/Bowel: Stomach, small bowel, and cecum are normal. The cecum extends towards the  midline. The appendix is normal. Colon and rectosigmoid colon are normal.  Vascular/Lymphatic: Abdominal aorta is normal caliber. There is no retroperitoneal or periportal lymphadenopathy. No pelvic lymphadenopathy.  Reproductive: There are rounded enhancing lesions within the wall of the uterus consistent with fibroids. There surgical sutures in the left adnexa. No inflammation or infection identified. No free fluid the pelvis.  Musculoskeletal: No acute osseous abnormality. Facet hypertrophy of the lower lumbar spine there is grade 1 anterolisthesis of L4 on S1.  Other: No free-fluid.  No free air  IMPRESSION: 1. Normal appendix. 2. Normal gallbladder. 3. Fibroid uterus. 4. Postsurgical change in the left adnexa without complicating features. 5. Degenerative spondylolisthesis of the lumbar spine.   Electronically Signed   By: Suzy Bouchard M.D.   On: 04/15/2014 08:02   [place] Art/ven Flow Abd Pelv Doppler  04/15/2014   CLINICAL DATA:  Abdominal pain, right lower quadrant. Evaluate for ovarian torsion.  EXAM: TRANSABDOMINAL AND TRANSVAGINAL ULTRASOUND OF PELVIS  DOPPLER ULTRASOUND OF OVARIES  TECHNIQUE: Both transabdominal and transvaginal ultrasound examinations of the pelvis were performed. Transabdominal technique was performed for global imaging of the pelvis including uterus, ovaries, adnexal regions, and pelvic cul-de-sac.  It was necessary to proceed with endovaginal exam following the transabdominal exam to visualize the ovaries. Color and duplex Doppler ultrasound was utilized to evaluate blood flow to the ovaries.  COMPARISON:  09/21/2010  FINDINGS: Uterus  Measurements: 11 x 5 x 8 cm. The echotexture is diffusely heterogeneous and there are multiple areas of masslike shadowing consistent with fibroids. The largest and most discrete is in the fundic myometrium measuring 3.5 cm.  Endometrium  Thickness: 4 mm. No distortion of the endometrium by the above described fibroids.  Right ovary  Measurements:  2.8 x 1.9 x 1.8 cm. Normal appearance/no adnexal mass.  Left ovary  Measurements: 3.9 x 1.9 x 2 cm. Dominant follicle present.  Pulsed Doppler evaluation of both ovaries demonstrates normal low-resistance arterial and venous waveforms. Lower amplitude waveforms on the right is likely technical; diastolic flow still noted within the right  ovary.  Other findings  No free fluid.  IMPRESSION: 1. No evidence for ovarian torsion. 2. Fibroid uterus, the largest measuring 3.5 cm.   Electronically Signed   By: Jorje Guild M.D.   On: 04/15/2014 06:19    Medications  0.9 %  sodium chloride infusion (0 mLs Intravenous Stopped 04/15/14 0734)    Followed by  0.9 %  sodium chloride infusion (not administered)  cefTRIAXone (ROCEPHIN) injection 250 mg (not administered)  ondansetron (ZOFRAN) injection 4 mg (4 mg Intravenous Given 04/15/14 0455)  fentaNYL (SUBLIMAZE) injection 50 mcg (50 mcg Intravenous Given 04/15/14 0508)  iohexol (OMNIPAQUE) 300 MG/ML solution 25 mL (25 mLs Oral Contrast Given 04/15/14 0651)  iohexol (OMNIPAQUE) 300 MG/ML solution 100 mL (100 mLs Intravenous Contrast Given 04/15/14 0716)   Filed Vitals:   04/15/14 0930 04/15/14 0945 04/15/14 1000 04/15/14 1015  BP: 122/74 117/93 122/73 123/79  Pulse:      Temp:      TempSrc:      Resp:      SpO2:       Diagnoses that have been ruled out:  None  Diagnoses that are still under consideration:  None  Final diagnoses:  Female pelvic pain  Right lower quadrant abdominal pain  PID (acute pelvic inflammatory disease)    CBC unremarkable-negative elevated leukocytosis. Hemoglobin 12.8, hematocrit 38.3. CMP unremarkable, mild elevated AST of 38. Kidney and liver functioning well. Lipase negative elevation. Urinalysis unremarkable-negative findings of infection. Urine pregnancy negative. Pelvic ultrasound no evidence of ovarian torsion-fibroid of the uterus noted measuring approximately 3.5 cm. CT abdomen and pelvis with contrast noted a  normal appendix, normal gallbladder, fibroid uterus. Postsurgical change in the left adnexa are without complicating features. Wet prep noted few yeast with endotracheal for clue cells-white blood cells too numerous to count. GC Chlamydia probe pending. Doubt ovarian torsion. Doubt ectopic pregnancy. Doubt TOA. Doubt appendicitis. Doubt pancreatitis. Doubt UTI/pyelonephritis. Doubt cholangitis/cholecystitis. Cannot rule out possible PID secondary to patient having right adnexal tenderness with white blood cell count too numerous to count on wet prep. Patient treated prophylactically for STD while in ED setting. Patient tolerated fluids by mouth without difficulty-negative episodes of emesis while in ED setting. Patient stable, afebrile. Patient not septic appearing. Discharged patient. Discharged patient with antibiotics. Discussed with patient to rest and stay hydrated. Recommended patient to have partner/partners checked. [facility] Discussed with patient to closely monitor symptoms and if symptoms are to worsen or change to report back to the ED - strict return instructions given.  Patient agreed to plan of care, understood, all questions answered.   Jamse Mead, PA-C 04/15/14 1055

## 2014-04-15 NOTE — ED Provider Notes (Signed)
Medical screening examination/treatment/procedure(s) were performed by non-physician practitioner and as supervising physician I was immediately available for consultation/collaboration.   EKG Interpretation None        Francine Graven, DO 04/15/14 2022

## 2014-04-15 NOTE — Discharge Instructions (Signed)
Please call your doctor for a followup appointment within 24-48 hours. When you talk to your doctor please let them know that you were seen in the emergency department and have them acquire all of your records so that they can discuss the findings with you and formulate a treatment plan to fully care for your new and ongoing problems. Please call and set-up an appointment with your primary care provider and OBGYN  Please rest and stay hydrated Please take medications as prescribed and on a full stomach  Please have all partner(s) checked  Please avoid any sexual encounters until re-assessed Please continue to monitor symptoms closely and if symptoms are to worsen or change (fever greater than 101, chills, sweating, nausea, vomiting, chest pain, shortness of breathe, difficulty breathing, weakness, numbness, tingling, worsening or changes to pain pattern, abnormal vaginal bleeding, abnormal vaginal discharge, ) please report back to the Emergency Department immediately.    Abdominal Pain Many things can cause abdominal pain. Usually, abdominal pain is not caused by a disease and will improve without treatment. It can often be observed and treated at home. Your health care provider will do a physical exam and possibly order blood tests and X-rays to help determine the seriousness of your pain. However, in many cases, more time must pass before a clear cause of the pain can be found. Before that point, your health care provider may not know if you need more testing or further treatment. HOME CARE INSTRUCTIONS  Monitor your abdominal pain for any changes. The following actions may help to alleviate any discomfort you are experiencing:  Only take over-the-counter or prescription medicines as directed by your health care provider.  Do not take laxatives unless directed to do so by your health care provider.  Try a clear liquid diet (broth, tea, or water) as directed by your health care provider. Slowly  move to a bland diet as tolerated. SEEK MEDICAL CARE IF:  You have unexplained abdominal pain.  You have abdominal pain associated with nausea or diarrhea.  You have pain when you urinate or have a bowel movement.  You experience abdominal pain that wakes you in the night.  You have abdominal pain that is worsened or improved by eating food.  You have abdominal pain that is worsened with eating fatty foods.  You have a fever. SEEK IMMEDIATE MEDICAL CARE IF:   Your pain does not go away within 2 hours.  You keep throwing up (vomiting).  Your pain is felt only in portions of the abdomen, such as the right side or the left lower portion of the abdomen.  You pass bloody or black tarry stools. MAKE SURE YOU:  Understand these instructions.   Will watch your condition.   Will get help right away if you are not doing well or get worse.  Document Released: 03/27/2005 Document Revised: 06/22/2013 Document Reviewed: 02/24/2013 [facility] Patient Information 2015 [place], [place]. This information is not intended to replace advice given to you by your health care provider. Make sure you discuss any questions you have with your health care provider.  Pelvic Inflammatory Disease Pelvic inflammatory disease (PID) refers to an infection in some or all of the female organs. The infection can be in the uterus, ovaries, fallopian tubes, or the surrounding tissues in the pelvis. PID can cause abdominal or pelvic pain that comes on suddenly (acute pelvic pain). PID is a serious infection because it can lead to lasting (chronic) pelvic pain or the inability to have children (  infertile).  CAUSES  The infection is often caused by the normal bacteria found in the vaginal tissues. PID may also be caused by an infection that is spread during sexual contact. PID can also occur following:   The birth of a baby.   A miscarriage.   An abortion.   Major pelvic surgery.   The use of an  intrauterine device (IUD).   A sexual assault.  RISK FACTORS Certain factors can put a person at higher risk for PID, such as:  Being younger than 25 years.  Being sexually active at [place] age.  Usingnonbarrier contraception.  Havingmultiple sexual partners.  Having sex with someone who has symptoms of a genital infection.  Using oral contraception. Other times, certain behaviors can increase the possibility of getting PID, such as:  Having sex during your period.  Using a vaginal douche.  Having an intrauterine device (IUD) in place. SYMPTOMS   Abdominal or pelvic pain.   Fever.   Chills.   Abnormal vaginal discharge.  Abnormal uterine bleeding.   Unusual pain shortly after finishing your period. DIAGNOSIS  Your caregiver will choose some of the following methods to make a diagnosis, such as:   Performinga physical exam and history. A pelvic exam typically reveals a very tender uterus and surrounding pelvis.   Ordering laboratory tests including a pregnancy test, blood tests, and urine test.  Orderingcultures of the vagina and cervix to check for a sexually transmitted infection (STI).  Performing an ultrasound.   Performing a laparoscopic procedure to look inside the pelvis.  TREATMENT   Antibiotic medicines may be prescribed and taken by mouth.   Sexual partners may be treated when the infection is caused by a sexually transmitted disease (STD).   Hospitalization may be needed to give antibiotics intravenously.  Surgery may be needed, but this is rare. It may take weeks until you are completely well. If you are diagnosed with PID, you should also be checked for human immunodeficiency virus (HIV). HOME CARE INSTRUCTIONS   If given, take your antibiotics as directed. Finish the medicine even if you start to feel better.   Only take over-the-counter or prescription medicines for pain, discomfort, or fever as directed by your  caregiver.   Do not have sexual intercourse until treatment is completed or as directed by your caregiver. If PID is confirmed, your recent sexual partner(s) will need treatment.   Keep your follow-up appointments. SEEK MEDICAL CARE IF:   You have increased or abnormal vaginal discharge.   You need prescription medicine for your pain.   You vomit.   You cannot take your medicines.   Your partner has an STD.  SEEK IMMEDIATE MEDICAL CARE IF:   You have a fever.   You have increased abdominal or pelvic pain.   You have chills.   You have pain when you urinate.   You are not better after 72 hours following treatment.  MAKE SURE YOU:   Understand these instructions.  Will watch your condition.  Will get help right away if you are not doing well or get worse. Document Released: 06/17/2005 Document Revised: 10/12/2012 Document Reviewed: 06/13/2011 [facility] Patient Information 2015 [place], [place]. This information is not intended to replace advice given to you by your health care provider. Make sure you discuss any questions you have with your health care provider.   Emergency Department Resource Guide 1) Find a Doctor and Pay Out of Pocket Although you won't have to find out who is covered  by your insurance plan, it is a good idea to ask around and get recommendations. You will then need to call the office and see if the doctor you have chosen will accept you as a new patient and what types of options they offer for patients who are self-pay. Some doctors offer discounts or will set up payment plans for their patients who do not have insurance, but you will need to ask so you aren't surprised when you get to your appointment.  2) Contact Your Local Health Department Not all health departments have doctors that can see patients for sick visits, but many do, so it is worth a call to see if yours does. If you don't know where your local health department is, you can  check in your phone book. The CDC also has a tool to help you locate your state's health department, and many state websites also have listings of all of their local health departments.  3) Find a [facility] If your illness is not likely to be very severe or complicated, you may want to try a walk in clinic. These are popping up all over the country in pharmacies, drugstores, and shopping centers. They're usually staffed by nurse practitioners or physician assistants that have been trained to treat common illnesses and complaints. They're usually fairly quick and inexpensive. However, if you have serious medical issues or chronic medical problems, these are probably not your best option.  No Primary Care Doctor: - Call Health Connect at  [phone] - they can help you locate a primary care doctor that  accepts your insurance, provides certain services, etc. - Physician Referral Service- [phone]  Chronic Pain Problems: Organization         Address  Phone   Notes  [facility]  [phone] Patients need to be referred by their primary care doctor.   Medication Assistance: Organization         Address  Phone   Notes  [facility] Medication [facility] [address] [place], [place] [address] [phone] --Must be a resident of [facility] -- Must have NO insurance coverage whatsoever (no Medicaid/ Medicare, etc.) -- The pt. MUST have a primary care doctor that directs their care regularly and follows them in the community   MedAssist  [phone]   [employer]  [phone]    Agencies that provide inexpensive medical care: Organization         Address  Phone   Notes  [place]  [phone]   [facility]    [phone]   [facility] [place], [place] [address] [phone]   [place] [address], [place] [phone]    Planned Parenthood    [phone]   [facility]    [phone]   [place] and [place], [place] Phone:  [phone], Fax:  [phone] Hours of Operation:  9 am - 6 pm, M-F.  Also accepts Medicaid/Medicare and self-pay.  [facility] for [place], [address], [place] Phone: [phone], Fax: [phone]. Hours of Operation:  8:30 am - 5:30 pm, M-F.  Also accepts Medicaid and self-pay.  [facility] [place] [address], [employer] Phone: [phone]   [place], [place], [place] [phone], Ext. [phone]  Mondays & Thursdays: 7-9 AM.  First 15 patients are seen on a first come, first serve basis.    [place] Providers:  Organization         Address  Phone   Notes  [facility] [address], [place] [phone] Also accepts self-pay patients.  [facility] [place] [address], [place]  [phone]   [place], [address], [place] [phone]   [facility] [address], [place] [phone]   Lucianne Lei [address], [place]   [phone] Only accepts [place] Access [place] patients after they have their name applied to their card.   Self-Pay (no insurance) in [facility]:  Organization         Address  Phone   Notes  Sickle Cell Patients, [facility] Internal Medicine [place] [phone]   [facility] [place] [phone]   [facility] [place]  [place], [place],  [phone]   [facility]/Dr. Osei-Bonsu  [address], [place] or [address], [place] [phone] Phone number for both [place] and [place] locations is the  same.  [facility] [address] [place] [phone]   [facility] [address] [place] or [address] [phone]   [facility] [address] [place] [phone], phone; [phone], fax Sees patients 1st and 3rd Saturday of every month.  Must not qualify for public or private insurance (i.e. Medicaid, Medicare, [place] Health Choice, Veterans' Benefits)  Household income should be no more than 200% of the poverty level The clinic cannot treat you if you are pregnant or think you are pregnant  Sexually transmitted diseases are not treated at the clinic.    Dental Care: Organization         Address  Phone  Notes  [facility] Department of [facility] [place] [phone] Accepts children up to age 67 who are enrolled in [place] or [employer]; pregnant women with a Medicaid card; and children who have applied for Medicaid or [place] Health Choice, but were declined, whose parents can pay a reduced fee at time of service.  [facility] Department of [facility]  [address], [place] [phone] Accepts children up to age 65 who are enrolled in [place] or [place]; pregnant women with a Medicaid card; and children who have applied for Medicaid or [place] Health Choice, but were declined, whose parents can pay a reduced fee at time of service.  [place] Adult Dental Access PROGRAM  [place] [phone] Patients are seen by appointment only. Walk-ins are not accepted. [place] will see patients 64 years of age and older. Monday - Tuesday (8am-5pm) Most Wednesdays (8:30-5pm) $30 per visit, cash only  [facility] Adult Dental Access PROGRAM  [address], [facility] [phone] Patients are seen by appointment only. Walk-ins are not accepted. [place] will see patients  59 years of age and older. One Wednesday Evening (Monthly: Volunteer Based).  $30 per visit, cash only  [place]  [phone] for adults;  Children under age 48, call Graduate Pediatric Dentistry at [phone]. Children aged 67-14, please call [phone] to request a pediatric application.  Dental services are provided in all areas of dental care including fillings, crowns and bridges, complete and partial dentures, implants, gum treatment, root canals, and extractions. Preventive care is also provided. Treatment is provided to both adults and children. Patients are selected via a lottery and there is often a waiting list.   [facility] [address], [place]  [phone] [email or web address]   [facility] [address] [employer], [place] [phone], Ext. [phone] Second and Fourth Thursday of each month, opens at 6:30 AM; Clinic ends at 9 AM.  Patients are seen on a first-come first-served basis, and a limited number are seen during each clinic.   [facility]  [address] Hillard Danker [place], [place] [phone]   Eligibility Requirements You must have lived in [place], [place], or [place] counties for at least the last three months.   You cannot be eligible for state or federal sponsored [employer], including [employer], [place], or [employer].   You generally cannot be eligible for healthcare insurance through your employer.    How to apply: Eligibility screenings are held every Tuesday and Wednesday afternoon from 1:00 pm until 4:00 pm. You do not need an appointment for the interview!  [facility] [address] [place], [place]   [place]  [place] Department  [place]  [phone]    Behavioral Health Resources in the Community: Intensive Outpatient  Programs Organization         Address  Phone  Notes  [place]. [address], [place], [place] [phone]   [facility] Outpatient [address], [place], [place]   ADS: Alcohol & Drug Svcs [address] [place], [place]   [place] [address]  [place], [place] or [phone]   Substance Abuse Resources Organization         Address  Phone  Notes  Alcohol and Drug Services  [phone]   [place]  [phone]   [facility] [place]   Chinita Pester  [phone]   Residential & Outpatient Substance Abuse Program  [phone]   Psychological Services Organization         Address  Phone  Notes  [facility] [place]  [place]  [phone]   [place] [address], [place] or [phone]    Mobile Crisis Teams Organization         Address  Phone  Notes  [facility], Mobile Crisis Care Unit  [phone]   [facility]  [address] [place] [address] [place], [place]   [place] [address] [place] [phone]    Self-Help/Support Groups Organization         Address  Phone             Notes  [place]. of [place] - variety of support groups  [place] Call for more information  Narcotics Anonymous (NA), [facility] [address], [employer] [place]  2 meetings at this location   Brewing technologist  Notes  [facility]idential [facility]  [address],    [place]  [phone]   [facility]  [address] [place] [address], [place], [place]   [place], [place] [phone] Admissions: 8am-3pm M-F  Incentives Substance [place] [address],    [place], [place]  [phone]   [facility] [address] [place], [place], [place]   [facility] [address],  [place], [place]   Insight Programs - Intensive Outpatient [address] Kristeen [address], [place], [place]   [facility] ([address]uth[address] [place].,  [place], [place] [phone] or [phone]   [facility] Services (RTS) [address], [place], [place] Accepts Medicaid  [facility] [place] [address]  [place] [phone] Substance Abuse/Addiction Treatment   [facility] Organization         Address  Phone  Notes  [facility]  [phone]   Domenic Schwab, PhD [address] [place], [place]   [phone] or [phone]   [place], [place] [phone]   [facility] [address] [place], [place] [phone] Insurance/Medicaid/sponsorship through [facility] and Families [address], [address] [place]                                    [place], [place] [phone] [place] [address]The [place], [place] [phone]    Dr. Adele Schilder  [phone]   [facility] Dept. 1) [address] [place] 2) [place] 3)  [place] [address], [place] [phone]  [phone]   [place] [phone] or [phone] (After Hours)

## 2014-04-15 NOTE — ED Notes (Signed)
Pt reports lower rt sided abd pain that started around midnight. Pt states she took some oxycodone she had at home for her back pain around 0030-0100 with no relief. Pt states she has nausea and vomiting with pain.  Pt rates pain 10/10. Pt tearful.

## 2014-04-15 NOTE — ED Notes (Signed)
Oral fluids given, Ginger Ale given and encouraged to drink.

## 2014-04-15 NOTE — ED Provider Notes (Signed)
CSN: [id number]     Arrival date & time 04/15/14  0359 History   First MD Initiated Contact with Patient 04/15/14 0407     Chief Complaint  Patient presents with  . Abdominal Pain     (Consider location/radiation/quality/duration/timing/severity/associated sxs/prior Treatment) HPI Patient reports she was awakened about midnight with sharp right lower quadrant pain that is intermittent and last minutes at a time. She has nausea and has had vomiting. She states movements and hitting bumps in the road as she was driving to the ED make the pain worse. She states nothing makes it feel better. She denies dysuria, frequency, hematuria, vaginal discharge. She states the pain is similar to the pain she had when she had a ectopic pregnancy many years ago. Patient states she has not had a menstrual period since she had uterine ablation done. Patient is sexually active. Patient states her current pain as a 10 out of 10.  PCP [facility] Dr Lynnette Caffey  Past Medical History  Diagnosis Date  . Hypertension   . Back pain    Past Surgical History  Procedure Laterality Date  . Back surgery    . Ectopic pregnancy surgery    . Tubal ligation     No family history on file. History  Substance Use Topics  . Smoking status: Never Smoker   . Smokeless tobacco: Not on file  . Alcohol Use: No     OB History   Grav Para Term Preterm Abortions TAB SAB Ect Mult Living                 Review of Systems  All other systems reviewed and are negative.     Allergies  Bee venom; Codeine; Demerol; and Morphine and related  Home Medications   Prior to Admission medications   Medication Sig Start Date End Date Taking? Authorizing Provider  amitriptyline (ELAVIL) 25 MG tablet Take 50 mg by mouth at bedtime.    Historical Provider, MD  baclofen (LIORESAL) 10 MG tablet Take 1 tablet (10 mg total) by mouth 3 (three) times daily. 02/24/14   Shari A Upstill, PA-C  buPROPion (WELLBUTRIN XL)  300 MG 24 hr tablet Take 300 mg by mouth daily.    Historical Provider, MD  clonazePAM (KLONOPIN) 1 MG tablet Take 1 mg by mouth 3 (three) times daily.    Historical Provider, MD  cyclobenzaprine (FLEXERIL) 10 MG tablet Take 10 mg by mouth 3 (three) times daily.     Historical Provider, MD  DULoxetine (CYMBALTA) 60 MG capsule Take 60 mg by mouth daily.    Historical Provider, MD  gabapentin (NEURONTIN) 300 MG capsule Take 300 mg by mouth 3 (three) times daily.    Historical Provider, MD  lisinopril-hydrochlorothiazide (PRINZIDE,ZESTORETIC) 10-12.5 MG per tablet Take 1 tablet by mouth daily.    Historical Provider, MD  oxyCODONE-acetaminophen (PERCOCET) 10-325 MG per tablet Take 1 tablet by mouth every 4 (four) hours as needed for pain.    Historical Provider, MD  temazepam (RESTORIL) 30 MG capsule Take 30 mg by mouth at bedtime. 02/10/14   Historical Provider, MD  zaleplon (SONATA) 5 MG capsule Take 5 mg by mouth at bedtime.    Historical Provider, MD   BP 131/114  Pulse 69  Temp(Src) 98.8 F (37.1 C) (Oral)  Resp 20  SpO2 97%  Vital signs normal except diastolic hypertension   Physical Exam  Nursing note and vitals reviewed. Constitutional: She is oriented to person, place, and time.  She appears well-developed and well-nourished.  Non-toxic appearance. She does not appear ill. She appears distressed.  Patient appears uncomfortable, she is tearful  HENT:  Head: Normocephalic and atraumatic.  Right Ear: External ear normal.  Left Ear: External ear normal.  Nose: Nose normal. No mucosal edema or rhinorrhea.  Mouth/Throat: Oropharynx is clear and moist and mucous membranes are normal. No dental abscesses or uvula swelling.  Eyes: Conjunctivae and EOM are normal. Pupils are equal, round, and reactive to light.  Neck: Normal range of motion and full passive range of motion without pain. Neck supple.  Cardiovascular: Normal rate, regular rhythm and normal heart sounds.  Exam reveals no gallop  and no friction rub.   No murmur heard. Pulmonary/Chest: Effort normal and breath sounds normal. No respiratory distress. She has no wheezes. She has no rhonchi. She has no rales. She exhibits no tenderness and no crepitus.  Abdominal: Soft. Normal appearance and bowel sounds are normal. She exhibits no distension. There is tenderness. There is no rebound and no guarding.    Patient is very tender in her right lower quadrant. Patient has pain in her right lower quadrant to knee tapping. She also has positive psoas sign on the right.  Musculoskeletal: Normal range of motion. She exhibits no edema and no tenderness.  Moves all extremities well.   Neurological: She is alert and oriented to person, place, and time. She has normal strength. No cranial nerve deficit.  Skin: Skin is warm, dry and intact. No rash noted. No erythema. No pallor.  Psychiatric: She has a normal mood and affect. Her speech is normal and behavior is normal. Her mood appears not anxious.    ED Course  Procedures (including critical care time) Medications  0.9 %  sodium chloride infusion (0 mLs Intravenous Stopped 04/15/14 0734)    Followed by  0.9 %  sodium chloride infusion (not administered)  ondansetron (ZOFRAN) injection 4 mg (4 mg Intravenous Given 04/15/14 0455)  fentaNYL (SUBLIMAZE) injection 50 mcg (50 mcg Intravenous Given 04/15/14 0508)  iohexol (OMNIPAQUE) 300 MG/ML solution 25 mL (25 mLs Oral Contrast Given 04/15/14 0651)  iohexol (OMNIPAQUE) 300 MG/ML solution 100 mL (100 mLs Intravenous Contrast Given 04/15/14 0716)    Patient was given IV fluids, IV pain and nausea medication. We discussed her pain could be early appendicitis, or a acute ovarian problem such as torsion. Abdominal/pelvic CT scan was ordered as was pelvic ultrasound with Doppler study.  08:00 pt turned over to Dr Thurnell Garbe at change of shift to get results of her AP CT scan  Labs Review Results for orders placed during the hospital  encounter of 04/15/14  URINALYSIS, ROUTINE W REFLEX MICROSCOPIC      Result Value Ref Range   Color, Urine YELLOW  YELLOW   APPearance CLEAR  CLEAR   Specific Gravity, Urine 1.021  1.005 - 1.030   pH 5.5  5.0 - 8.0   Glucose, UA NEGATIVE  NEGATIVE mg/dL   Hgb urine dipstick NEGATIVE  NEGATIVE   Bilirubin Urine NEGATIVE  NEGATIVE   Ketones, ur NEGATIVE  NEGATIVE mg/dL   Protein, ur 100 (*) NEGATIVE mg/dL   Urobilinogen, UA 0.2  0.0 - 1.0 mg/dL   Nitrite NEGATIVE  NEGATIVE   Leukocytes, UA NEGATIVE  NEGATIVE  CBC WITH DIFFERENTIAL      Result Value Ref Range   WBC 8.7  4.0 - 10.5 K/uL   RBC 4.55  3.87 - 5.11 MIL/uL   Hemoglobin 12.8  12.0 -  15.0 g/dL   HCT 38.3  36.0 - 46.0 %   MCV 84.2  78.0 - 100.0 fL   MCH 28.1  26.0 - 34.0 pg   MCHC 33.4  30.0 - 36.0 g/dL   RDW 12.8  11.5 - 15.5 %   Platelets 316  150 - 400 K/uL   Neutrophils Relative % 56  43 - 77 %   Neutro Abs 4.9  1.7 - 7.7 K/uL   Lymphocytes Relative 34  12 - 46 %   Lymphs Abs 2.9  0.7 - 4.0 K/uL   Monocytes Relative 6  3 - 12 %   Monocytes Absolute 0.6  0.1 - 1.0 K/uL   Eosinophils Relative 3  0 - 5 %   Eosinophils Absolute 0.3  0.0 - 0.7 K/uL   Basophils Relative 1  0 - 1 %   Basophils Absolute 0.1  0.0 - 0.1 K/uL  COMPREHENSIVE METABOLIC PANEL      Result Value Ref Range   Sodium 140  137 - 147 mEq/L   Potassium 4.9  3.7 - 5.3 mEq/L   Chloride 102  96 - 112 mEq/L   CO2 25  19 - 32 mEq/L   Glucose, Bld 131 (*) 70 - 99 mg/dL   BUN 18  6 - 23 mg/dL   Creatinine, Ser 1.03  0.50 - 1.10 mg/dL   Calcium 9.4  8.4 - 10.5 mg/dL   Total Protein 7.9  6.0 - 8.3 g/dL   Albumin 3.5  3.5 - 5.2 g/dL   AST 38 (*) 0 - 37 U/L   ALT 11  0 - 35 U/L   Alkaline Phosphatase 42  39 - 117 U/L   Total Bilirubin 0.4  0.3 - 1.2 mg/dL   GFR calc non Af Amer 65 (*) >90 mL/min   GFR calc Af Amer 75 (*) >90 mL/min   Anion gap 13  5 - 15  LIPASE, BLOOD      Result Value Ref Range   Lipase 32  11 - 59 U/L  URINE MICROSCOPIC-ADD ON       Result Value Ref Range   Squamous Epithelial / LPF RARE  RARE   WBC, UA 0-2  <3 WBC/hpf   RBC / HPF 0-2  <3 RBC/hpf   Casts HYALINE CASTS (*) NEGATIVE  POC URINE PREG, ED      Result Value Ref Range   Preg Test, Ur NEGATIVE  NEGATIVE    Laboratory interpretation all normal   Imaging Review  AP CT scan pending   US Transvaginal Non-ob  US Pelvis Complete  US Art/ven Flow Abd Pelv Doppler  04/15/2014   CLINICAL DATA:  Abdominal pain, right lower quadrant. Evaluate for ovarian torsion.  EXAM: TRANSABDOMINAL AND TRANSVAGINAL ULTRASOUND OF PELVIS  DOPPLER ULTRASOUND OF OVARIES  TECHNIQUE: Both transabdominal and transvaginal ultrasound examinations of the pelvis were performed. Transabdominal technique was performed for global imaging of the pelvis including uterus, ovaries, adnexal regions, and pelvic cul-de-sac.  It was necessary to proceed with endovaginal exam following the transabdominal exam to visualize the ovaries. Color and duplex Doppler ultrasound was utilized to evaluate blood flow to the ovaries.  COMPARISON:  09/21/2010  FINDINGS: Uterus  Measurements: 11 x 5 x 8 cm. The echotexture is diffusely heterogeneous and there are multiple areas of masslike shadowing consistent with fibroids. The largest and most discrete is in the fundic myometrium measuring 3.5 cm.  Endometrium  Thickness: 4 mm. No distortion of the endometrium by  the above described fibroids.  Right ovary  Measurements: 2.8 x 1.9 x 1.8 cm. Normal appearance/no adnexal mass.  Left ovary  Measurements: 3.9 x 1.9 x 2 cm. Dominant follicle present.  Pulsed Doppler evaluation of both ovaries demonstrates normal low-resistance arterial and venous waveforms. Lower amplitude waveforms on the right is likely technical; diastolic flow still noted within the right ovary.  Other findings  No free fluid.  IMPRESSION: 1. No evidence for ovarian torsion. 2. Fibroid uterus, the largest measuring 3.5 cm.   Electronically Signed   By:  Jorje Guild M.D.   On: 04/15/2014 06:19    Dg Knee Complete 4 Views Right  04/01/2014   CLINICAL DATA:  43 year old female with general right knee pain. No known trauma.   IMPRESSION: No radiographic evidence of acute abnormality.  Signed,  Dulcy Fanny. Earleen Newport, DO  [facility]  [facility]   Electronically Signed   By: Corrie Mckusick D.O.   On: 04/01/2014 09:01    EKG Interpretation None      MDM   Final diagnoses:  Female pelvic pain  Right lower quadrant abdominal pain    disposition pending  Rolland Porter, MD, Abram Sander     Janice Norrie, MD 04/15/14 [phone]

## 2014-04-15 NOTE — ED Notes (Signed)
Patient returned from CT

## 2014-04-16 LAB — GC/CHLAMYDIA PROBE AMP
CT Probe RNA: NEGATIVE
GC Probe RNA: NEGATIVE

## 2014-04-18 ENCOUNTER — Emergency Department (HOSPITAL_COMMUNITY)
Admission: EM | Admit: 2014-04-18 | Discharge: 2014-04-18 | Disposition: A | Discharge status: Home / Self Care | Payer: Medicare Other | Attending: Emergency Medicine | Admitting: Emergency Medicine

## 2014-04-18 ENCOUNTER — Encounter (HOSPITAL_COMMUNITY): Payer: Self-pay | Admitting: Emergency Medicine

## 2014-04-18 DIAGNOSIS — D259 Leiomyoma of uterus, unspecified: Secondary | ICD-10-CM | POA: Diagnosis not present

## 2014-04-18 DIAGNOSIS — R112 Nausea with vomiting, unspecified: Secondary | ICD-10-CM

## 2014-04-18 DIAGNOSIS — R10813 Right lower quadrant abdominal tenderness: Secondary | ICD-10-CM | POA: Diagnosis not present

## 2014-04-18 DIAGNOSIS — M549 Dorsalgia, unspecified: Secondary | ICD-10-CM | POA: Diagnosis not present

## 2014-04-18 DIAGNOSIS — N939 Abnormal uterine and vaginal bleeding, unspecified: Secondary | ICD-10-CM | POA: Insufficient documentation

## 2014-04-18 DIAGNOSIS — I10 Essential (primary) hypertension: Secondary | ICD-10-CM | POA: Diagnosis not present

## 2014-04-18 MED ORDER — ONDANSETRON 4 MG PO TBDP
8.0000 mg | ORAL_TABLET | Freq: Once | ORAL | Status: AC
  Administered 2014-04-18: 8 mg via ORAL
  Filled 2014-04-18: qty 2

## 2014-04-18 MED ORDER — NAPROXEN 250 MG PO TABS
500.0000 mg | ORAL_TABLET | Freq: Once | ORAL | Status: AC
  Administered 2014-04-18: 500 mg via ORAL
  Filled 2014-04-18: qty 2

## 2014-04-18 MED ORDER — ONDANSETRON 4 MG PO TBDP: 4.0000 mg | ORAL_TABLET | Freq: Three times a day (TID) | ORAL | Status: DC | PRN

## 2014-04-18 NOTE — Discharge Instructions (Signed)
Please follow up with your primary care physician in 1-2 days. If you do not have one please call the [facility] number listed above. Please follow up with your Ob/Gyn Dr. Lynnette Caffey to schedule a follow up appointment.  Please use your Zofran to help with nausea and vomiting. Please finish your antibiotic course as recommended. Please read all discharge instructions and return precautions.    Nausea and Vomiting Nausea is a sick feeling that often comes before throwing up (vomiting). Vomiting is a reflex where stomach contents come out of your mouth. Vomiting can cause severe loss of body fluids (dehydration). Children and elderly adults can become dehydrated quickly, especially if they also have diarrhea. Nausea and vomiting are symptoms of a condition or disease. It is important to find the cause of your symptoms. CAUSES   Direct irritation of the stomach lining. This irritation can result from increased acid production (gastroesophageal reflux disease), infection, food poisoning, taking certain medicines (such as nonsteroidal anti-inflammatory drugs), alcohol use, or tobacco use.  Signals from the brain.These signals could be caused by a headache, heat exposure, an inner ear disturbance, increased pressure in the brain from injury, infection, a tumor, or a concussion, pain, emotional stimulus, or metabolic problems.  An obstruction in the gastrointestinal tract (bowel obstruction).  Illnesses such as diabetes, hepatitis, gallbladder problems, appendicitis, kidney problems, cancer, sepsis, atypical symptoms of a heart attack, or eating disorders.  Medical treatments such as chemotherapy and radiation.  Receiving medicine that makes you sleep (general anesthetic) during surgery. DIAGNOSIS Your caregiver may ask for tests to be done if the problems do not improve after a few days. Tests may also be done if symptoms are severe or if the reason for the nausea and vomiting is not  clear. Tests may include:  Urine tests.  Blood tests.  Stool tests.  Cultures (to look for evidence of infection).  X-rays or other imaging studies. Test results can help your caregiver make decisions about treatment or the need for additional tests. TREATMENT You need to stay well hydrated. Drink frequently but in small amounts.You may wish to drink water, sports drinks, clear broth, or eat frozen ice pops or gelatin dessert to help stay hydrated.When you eat, eating slowly may help prevent nausea.There are also some antinausea medicines that may help prevent nausea. HOME CARE INSTRUCTIONS   Take all medicine as directed by your caregiver.  If you do not have an appetite, do not force yourself to eat. However, you must continue to drink fluids.  If you have an appetite, eat a normal diet unless your caregiver tells you differently.  Eat a variety of complex carbohydrates (rice, wheat, potatoes, bread), lean meats, yogurt, fruits, and vegetables.  Avoid high-fat foods because they are more difficult to digest.  Drink enough water and fluids to keep your urine clear or pale yellow.  If you are dehydrated, ask your caregiver for specific rehydration instructions. Signs of dehydration may include:  Severe thirst.  Dry lips and mouth.  Dizziness.  Dark urine.  Decreasing urine frequency and amount.  Confusion.  Rapid breathing or pulse. SEEK IMMEDIATE MEDICAL CARE IF:   You have blood or brown flecks (like coffee grounds) in your vomit.  You have black or bloody stools.  You have a severe headache or stiff neck.  You are confused.  You have severe abdominal pain.  You have chest pain or trouble breathing.  You do not urinate at least once every 8 hours.  You develop cold or clammy skin.  You continue to vomit for longer than 24 to 48 hours.  You have a fever. MAKE SURE YOU:   Understand these instructions.  Will watch your condition.  Will get help  right away if you are not doing well or get worse. Document Released: 06/17/2005 Document Revised: 09/09/2011 Document Reviewed: 11/14/2010 [facility] Patient Information 2015 [place], [place]. This information is not intended to replace advice given to you by your health care provider. Make sure you discuss any questions you have with your health care provider.

## 2014-04-18 NOTE — ED Notes (Signed)
Pt from home with c/o ongoing PID symptoms; RLQ pain, nausea, and vomiting.  Pt states she was seen on Fri for same, went to work today and had to leave due to emesis.  Employer stated she needs a note to return to work.  Pt ambulated to room, in NAD, A&O.

## 2014-04-18 NOTE — ED Provider Notes (Signed)
CSN: [id number]     Arrival date & time 04/18/14  0909 History   First MD Initiated Contact with Patient 04/18/14 0915     Chief Complaint  Patient presents with  . Emesis  . Abdominal Pain     (Consider location/radiation/quality/duration/timing/severity/associated sxs/prior Treatment) HPI Comments: Patient is a [id number] 43 yo F presenting to the ED for continued nausea, vomiting, vaginal bleeding, and abdominal pain after being seen and diagnosed with PID on Friday. Patient states she has had 2-3 episodes of non-bloody non-bilious emesis each day with continued abdominal pain. Patient has been taking her doxycycline without difficulty. She has not followed up with her Ob/Gyn yet. No medications taken PTA. Abdominal surgical history includes ectopic pregnancy removal. Patient with negative CT abd/pelvis on Friday. No acute findings on ultrasound. Gonorrhea and Chlamydia tests are negative.  Patient is a 43 y.o. female presenting with vomiting and abdominal pain.  Emesis Associated symptoms: abdominal pain   Associated symptoms: no chills and no diarrhea   Abdominal Pain Associated symptoms: nausea, vaginal bleeding and vomiting   Associated symptoms: no chills, no constipation, no diarrhea, no fever and no vaginal discharge     Past Medical History  Diagnosis Date  . Hypertension   . Back pain   . Uterine fibroid    Past Surgical History  Procedure Laterality Date  . Back surgery    . Ectopic pregnancy surgery    . Tubal ligation     History reviewed. No pertinent family history. History  Substance Use Topics  . Smoking status: Never Smoker   . Smokeless tobacco: Not on file  . Alcohol Use: No   OB History   Grav Para Term Preterm Abortions TAB SAB Ect Mult Living                 Review of Systems  Constitutional: Negative for fever and chills.  Gastrointestinal: Positive for nausea, vomiting and abdominal pain. Negative for diarrhea, constipation and blood in stool.   Genitourinary: Positive for vaginal bleeding. Negative for vaginal discharge.  All other systems reviewed and are negative.     Allergies  Bee venom; Codeine; Demerol; and Morphine and related  Home Medications   Prior to Admission medications   Medication Sig Start Date End Date Taking? Authorizing Provider  buPROPion (WELLBUTRIN XL) 300 MG 24 hr tablet Take 300 mg by mouth daily.    Historical Provider, MD  clonazePAM (KLONOPIN) 1 MG tablet Take 1 mg by mouth 3 (three) times daily.    Historical Provider, MD  doxycycline (VIBRAMYCIN) 100 MG capsule Take 1 capsule (100 mg total) by mouth 2 (two) times daily. One po bid x 7 days 04/15/14   Marissa Sciacca, PA-C  DULoxetine (CYMBALTA) 60 MG capsule Take 60 mg by mouth daily.    Historical Provider, MD  gabapentin (NEURONTIN) 300 MG capsule Take 300 mg by mouth 3 (three) times daily.    Historical Provider, MD  lisinopril-hydrochlorothiazide (PRINZIDE,ZESTORETIC) 10-12.5 MG per tablet Take 1 tablet by mouth daily.    Historical Provider, MD  ondansetron (ZOFRAN ODT) 4 MG disintegrating tablet Take 1 tablet (4 mg total) by mouth every 8 (eight) hours as needed for nausea or vomiting. 04/18/14   Afrah Burlison L Shahzad Thomann, PA-C  oxyCODONE-acetaminophen (PERCOCET) 10-325 MG per tablet Take 1 tablet by mouth every 4 (four) hours as needed for pain.    Historical Provider, MD  temazepam (RESTORIL) 30 MG capsule Take 30 mg by mouth at bedtime. 02/10/14   Historical  Provider, MD  tiZANidine (ZANAFLEX) 2 MG tablet Take 2 mg by mouth every 6 (six) hours as needed for muscle spasms.    Historical Provider, MD  zaleplon (SONATA) 5 MG capsule Take 5 mg by mouth at bedtime.    Historical Provider, MD   BP 114/70  Pulse 71  Temp(Src) 99.1 F (37.3 C) (Oral)  Resp 16  Ht 5\' 6"  (1.676 m)  Wt 158 lb (71.668 kg)  BMI 25.51 kg/m2  SpO2 95% Physical Exam  Nursing note and vitals reviewed. Constitutional: She is oriented to person, place, and time. She  appears well-developed and well-nourished. No distress.  HENT:  Head: Normocephalic and atraumatic.  Right Ear: External ear normal.  Left Ear: External ear normal.  Nose: Nose normal.  Mouth/Throat: Oropharynx is clear and moist.  Eyes: Conjunctivae are normal.  Neck: Neck supple.  Cardiovascular: Normal rate, regular rhythm and normal heart sounds.   Pulmonary/Chest: Effort normal and breath sounds normal.  Abdominal: Soft. Normal appearance and bowel sounds are normal. She exhibits no distension. There is tenderness (mild RLQ). There is no rigidity, no rebound and no guarding.  Neurological: She is alert and oriented to person, place, and time.  Skin: Skin is warm and dry. She is not diaphoretic.    ED Course  Procedures (including critical care time) Medications  ondansetron (ZOFRAN-ODT) disintegrating tablet 8 mg (8 mg Oral Given 04/18/14 0925)  naproxen (NAPROSYN) tablet 500 mg (500 mg Oral Given 04/18/14 0951)    Labs Review Labs Reviewed - No data to display  Imaging Review No results found.   EKG Interpretation None      MDM   Final diagnoses:  Nausea and vomiting in adult    Filed Vitals:   04/18/14 1039  BP: 114/70  Pulse: 71  Temp: 99.1 F (37.3 C)  Resp: 16   Afebrile, NAD, non-toxic appearing, AAOx4.  Patient with continued abdominal pain with nausea and vomiting and vaginal bleeding since discharge from the emergency department on Friday. At that time patient had negative CT abdomen and pelvis with contrast. Pelvic ultrasound also negative for any evidence of torsion or other acute finding. Patient was diagnosed with PID and sent home on doxycycline. Abdomen is soft, mildly tender in right lower quadrant without guarding, rigidity or rebound. Bowel sounds are normal. In symptoms mentioned emergency department. Patient is able to tolerate by mouth liquids without difficulty. Advised patient she is to followup with her OB/GYN for recheck of all symptoms.  Patient is agreeable to plan and stable at time of discharge. Patient d/w with Dr. Vanita Panda, agrees with plan.           Harlow Mares, PA-C 04/18/14 1524

## 2014-04-18 NOTE — ED Provider Notes (Signed)
  Medical screening examination/treatment/procedure(s) were performed by non-physician practitioner and as supervising physician I was immediately available for consultation/collaboration.   Carmin Muskrat, MD 04/18/14 1622

## 2014-07-01 HISTORY — PX: POSTERIOR LUMBAR FUSION: SHX6036

## 2014-07-28 ENCOUNTER — Emergency Department (HOSPITAL_COMMUNITY)
Admission: EM | Admit: 2014-07-28 | Discharge: 2014-07-28 | Disposition: A | Discharge status: Home / Self Care | Payer: Medicare Other | Attending: Emergency Medicine | Admitting: Emergency Medicine

## 2014-07-28 ENCOUNTER — Emergency Department (HOSPITAL_COMMUNITY): Payer: Medicare Other

## 2014-07-28 ENCOUNTER — Encounter (HOSPITAL_COMMUNITY): Payer: Self-pay | Admitting: *Deleted

## 2014-07-28 DIAGNOSIS — Z792 Long term (current) use of antibiotics: Secondary | ICD-10-CM | POA: Insufficient documentation

## 2014-07-28 DIAGNOSIS — Y9289 Other specified places as the place of occurrence of the external cause: Secondary | ICD-10-CM | POA: Insufficient documentation

## 2014-07-28 DIAGNOSIS — I10 Essential (primary) hypertension: Secondary | ICD-10-CM | POA: Insufficient documentation

## 2014-07-28 DIAGNOSIS — X58XXXA Exposure to other specified factors, initial encounter: Secondary | ICD-10-CM | POA: Diagnosis not present

## 2014-07-28 DIAGNOSIS — Z8542 Personal history of malignant neoplasm of other parts of uterus: Secondary | ICD-10-CM | POA: Diagnosis not present

## 2014-07-28 DIAGNOSIS — Y9389 Activity, other specified: Secondary | ICD-10-CM | POA: Insufficient documentation

## 2014-07-28 DIAGNOSIS — M5441 Lumbago with sciatica, right side: Secondary | ICD-10-CM | POA: Diagnosis not present

## 2014-07-28 DIAGNOSIS — R202 Paresthesia of skin: Secondary | ICD-10-CM | POA: Insufficient documentation

## 2014-07-28 DIAGNOSIS — Y998 Other external cause status: Secondary | ICD-10-CM | POA: Diagnosis not present

## 2014-07-28 DIAGNOSIS — M549 Dorsalgia, unspecified: Secondary | ICD-10-CM

## 2014-07-28 DIAGNOSIS — S79911A Unspecified injury of right hip, initial encounter: Secondary | ICD-10-CM | POA: Diagnosis present

## 2014-07-28 DIAGNOSIS — Z79899 Other long term (current) drug therapy: Secondary | ICD-10-CM | POA: Insufficient documentation

## 2014-07-28 DIAGNOSIS — M5431 Sciatica, right side: Secondary | ICD-10-CM

## 2014-07-28 MED ORDER — PREDNISONE 10 MG PO TABS: 20.0000 mg | ORAL_TABLET | Freq: Every day | ORAL | Status: DC

## 2014-07-28 MED ORDER — PREDNISONE 20 MG PO TABS: ORAL_TABLET | ORAL | Status: DC

## 2014-07-28 NOTE — Discharge Instructions (Signed)
Please call your doctor for a followup appointment within 24-48 hours. When you talk to your doctor please let them know that you were seen in the emergency department and have them acquire all of your records so that they can discuss the findings with you and formulate a treatment plan to fully care for your new and ongoing problems. Please follow-up with your primary care provider Please follow-up with orthopedics Please take medications as prescribed on a full stomach Please avoid any physical or strenuous activity-please no heavy lifting Please apply warm compressions and massage Please continue to monitor symptoms closely and if symptoms are to worsen or change (fever greater than 101, chills, sweating, nausea, vomiting, chest pain, shortness of breathe, difficulty breathing, weakness, numbness, tingling, worsening or changes to pain pattern, fall, injury, loss of sensation, weakness, swelling to the leg, Richard streaks running down the leg) please report back to the Emergency Department immediately.   Radicular Pain Radicular pain in either the arm or leg is usually from a bulging or herniated disk in the spine. A piece of the herniated disk may press against the nerves as the nerves exit the spine. This causes pain which is felt at the tips of the nerves down the arm or leg. Other causes of radicular pain may include:  Fractures.  Heart disease.  Cancer.  An abnormal and usually degenerative state of the nervous system or nerves (neuropathy). Diagnosis may require CT or MRI scanning to determine the primary cause.  Nerves that start at the neck (nerve roots) may cause radicular pain in the outer shoulder and arm. It can spread down to the thumb and fingers. The symptoms vary depending on which nerve root has been affected. In most cases radicular pain improves with conservative treatment. Neck problems may require physical therapy, a neck collar, or cervical traction. Treatment may take  many weeks, and surgery may be considered if the symptoms do not improve.  Conservative treatment is also recommended for sciatica. Sciatica causes pain to radiate from the lower back or buttock area down the leg into the foot. Often there is a history of back problems. Most patients with sciatica are better after 2 to 4 weeks of rest and other supportive care. Short term bed rest can reduce the disk pressure considerably. Sitting, however, is not a good position since this increases the pressure on the disk. You should avoid bending, lifting, and all other activities which make the problem worse. Traction can be used in severe cases. Surgery is usually reserved for patients who do not improve within the first months of treatment. Only take over-the-counter or prescription medicines for pain, discomfort, or fever as directed by your caregiver. Narcotics and muscle relaxants may help by relieving more severe pain and spasm and by providing mild sedation. Cold or massage can give significant relief. Spinal manipulation is not recommended. It can increase the degree of disc protrusion. Epidural steroid injections are often effective treatment for radicular pain. These injections deliver medicine to the spinal nerve in the space between the protective covering of the spinal cord and back bones (vertebrae). Your caregiver can give you more information about steroid injections. These injections are most effective when given within two weeks of the onset of pain.  You should see your caregiver for follow up care as recommended. A program for neck and back injury rehabilitation with stretching and strengthening exercises is an important part of management.  SEEK IMMEDIATE MEDICAL CARE IF:  You develop increased pain, weakness,  or numbness in your arm or leg.  You develop difficulty with bladder or bowel control.  You develop abdominal pain. Document Released: 07/25/2004 Document Revised: 09/09/2011 Document  Reviewed: 10/10/2008 [facility] Patient Information 2015 [place], [place]. This information is not intended to replace advice given to you by your health care provider. Make sure you discuss any questions you have with your health care provider.

## 2014-07-28 NOTE — ED Notes (Signed)
Declined W/C at D/C and was escorted to lobby by RN. 

## 2014-07-28 NOTE — ED Provider Notes (Signed)
CSN: [id number]     Arrival date & time 07/28/14  0801 History   First MD Initiated Contact with Patient 07/28/14 0815     Chief Complaint  Patient presents with  . Hip Pain     (Consider location/radiation/quality/duration/timing/severity/associated sxs/prior Treatment) The history is provided by the patient. No language interpreter was used.  Taylor Bowman is a 44 year old female with past medical history of hypertension, uterine fibroids, back pain with surgery performed in 2006-laminectomy in the lumbar spine, tubal ligation presenting to emergency department with right hip pain that has been ongoing since Saturday. Patient reported that she twisted quickly resulting in sudden pain to her right hip radiating down the posterior aspect of her right leg. Patient reports that the pain is a stabbing pain when she applies pressure to the right leg, when walking, when sitting on the right side. Reported that she feels tingling running down her right leg. Stated that she's been using oxycodone 10 mg for pain with minimal relief. Patient reported that she has been dealing with back pain for many years and that she is always experiencing back pain. Denied falls, injuries, urinary and bowel incontinence, dysuria, fevers. PCP none  Past Medical History  Diagnosis Date  . Hypertension   . Back pain   . Uterine fibroid    Past Surgical History  Procedure Laterality Date  . Back surgery    . Ectopic pregnancy surgery    . Tubal ligation     History reviewed. No pertinent family history. History  Substance Use Topics  . Smoking status: Never Smoker   . Smokeless tobacco: Not on file  . Alcohol Use: No   OB History    No data available     Review of Systems  Constitutional: Negative for fever and chills.  Musculoskeletal: Positive for back pain and arthralgias (right hip pain).      Allergies  Bee venom; Codeine; Demerol; and Morphine and related  Home Medications   Prior to  Admission medications   Medication Sig Start Date End Date Taking? Authorizing Provider  buPROPion (WELLBUTRIN XL) 300 MG 24 hr tablet Take 300 mg by mouth daily.    Historical Provider, MD  clonazePAM (KLONOPIN) 1 MG tablet Take 1 mg by mouth 3 (three) times daily.    Historical Provider, MD  doxycycline (VIBRAMYCIN) 100 MG capsule Take 1 capsule (100 mg total) by mouth 2 (two) times daily. One po bid x 7 days 04/15/14   Rondall Radigan, PA-C  DULoxetine (CYMBALTA) 60 MG capsule Take 60 mg by mouth daily.    Historical Provider, MD  gabapentin (NEURONTIN) 300 MG capsule Take 300 mg by mouth 3 (three) times daily.    Historical Provider, MD  lisinopril-hydrochlorothiazide (PRINZIDE,ZESTORETIC) 10-12.5 MG per tablet Take 1 tablet by mouth daily.    Historical Provider, MD  ondansetron (ZOFRAN ODT) 4 MG disintegrating tablet Take 1 tablet (4 mg total) by mouth every 8 (eight) hours as needed for nausea or vomiting. 04/18/14   Jennifer L Piepenbrink, PA-C  oxyCODONE-acetaminophen (PERCOCET) 10-325 MG per tablet Take 1 tablet by mouth every 4 (four) hours as needed for pain.    Historical Provider, MD  predniSONE (DELTASONE) 10 MG tablet Take 2 tablets (20 mg total) by mouth daily. 07/28/14   Jynesis Nakamura, PA-C  temazepam (RESTORIL) 30 MG capsule Take 30 mg by mouth at bedtime. 02/10/14   Historical Provider, MD  tiZANidine (ZANAFLEX) 2 MG tablet Take 2 mg by mouth every 6 (six) hours as  needed for muscle spasms.    Historical Provider, MD  zaleplon (SONATA) 5 MG capsule Take 5 mg by mouth at bedtime.    Historical Provider, MD   BP 131/82 mmHg  Pulse 76  Temp(Src) 97.9 F (36.6 C) (Oral)  Resp 18  Ht 5\' 6"  (1.676 m)  Wt 170 lb (77.111 kg)  BMI 27.45 kg/m2  SpO2 98% Physical Exam  Constitutional: She is oriented to person, place, and time. She appears well-developed and well-nourished. No distress.  HENT:  Head: Normocephalic and atraumatic.  Mouth/Throat: Oropharynx is clear and moist. No  oropharyngeal exudate.  Eyes: Conjunctivae and EOM are normal. Pupils are equal, round, and reactive to light. Right eye exhibits no discharge. Left eye exhibits no discharge.  Neck: Normal range of motion. Neck supple.  Cardiovascular: Normal rate, regular rhythm and normal heart sounds.   Pulses:      Radial pulses are 2+ on the right side, and 2+ on the left side.       Dorsalis pedis pulses are 2+ on the right side, and 2+ on the left side.  Pulmonary/Chest: Effort normal and breath sounds normal.  Musculoskeletal: Normal range of motion. She exhibits tenderness.       Lumbar back: She exhibits tenderness. She exhibits normal range of motion, no bony tenderness, no swelling, no edema, no deformity, no laceration and no pain.       Back:       Legs: Negative swelling, erythema, inflammation, deformities, malalignment identified to the spine. Tenderness upon palpation to the mid lumbar and paravertebral regions of the lumbar spine bilaterally. Negative swelling, erythema, formation, red streaks, lesions, sores identified to lower extremities-discomfort upon palpation to the posterior aspect of the right buttock and posterior aspect of the right leg. Patient has full flexion, extension and motion to the hips bilaterally. Full range of motion to the lower tremors bilaterally without difficulty or ataxia.  Neurological: She is alert and oriented to person, place, and time. No cranial nerve deficit. She exhibits normal muscle tone. Coordination normal.  Cranial nerves III-XII grossly intact Strength 5+/5+ to lower extremities bilaterally with resistance applied, equal distribution noted Negative saddle paresthesias bilaterally Sensation intact with differentiation to sharp and dull touch Gait proper with-negative step-offs or sway. Negative ataxic gait.  Skin: Skin is warm and dry. No rash noted. She is not diaphoretic. No erythema.  Psychiatric: She has a normal mood and affect. Her behavior is  normal. Thought content normal.  Nursing note and vitals reviewed.   ED Course  Procedures (including critical care time) Labs Review Labs Reviewed - No data to display  Imaging Review Dg Lumbar Spine Complete  07/28/2014   CLINICAL DATA:  Low back pain extending into the right hip region following fall 5 days prior  EXAM: LUMBAR SPINE - COMPLETE 4+ VIEW  COMPARISON:  CT abdomen and pelvis with bony reformats April 15, 2014 and September 21, 2010  FINDINGS: Frontal, lateral, spot lumbosacral lateral, and bilateral oblique views were obtained. There are 5 non-rib-bearing lumbar type vertebral bodies. There is no demonstrable acute fracture. There is stable 11 mm of anterolisthesis of L4 on L5. No other spondylolisthesis is seen. There is moderate disc space narrowing at L4-5, essentially stable from previous study. There is slight disc space narrowing at L3-4 and L5-S1, also stable. There is facet osteoarthritic change at L3-4 on the left and at L4-5 and L5-S1 bilaterally.  IMPRESSION: Spondylolisthesis at L4-5 which was present on the prior CT examination  from 2015 but is new from 2012. Question etiology for this spondylolisthesis. This finding on prior CT from 2015 confirms that the finding is not acute. No fracture seen. Areas of osteoarthritic change as described.   Electronically Signed   By: Lowella Grip M.D.   On: 07/28/2014 09:19   Dg Hip Unilat With Pelvis 2-3 Views Right  07/28/2014   CLINICAL DATA:  Acute right hip pain after fall 5 days ago. Initial encounter.  EXAM: DG HIP W/ PELVIS 2-3V*R*  COMPARISON:  None.  FINDINGS: No fracture or dislocation is noted. No significant degenerative changes noted.  IMPRESSION: Normal right hip.   Electronically Signed   By: Sabino Dick M.D.   On: 07/28/2014 09:18     EKG Interpretation None      MDM   Final diagnoses:  Back pain  Sciatica, right    Medications - No data to display  Filed Vitals:   07/28/14 0809  BP: 131/82  Pulse:  76  Temp: 97.9 F (36.6 C)  TempSrc: Oral  Resp: 18  Height: 5\' 6"  (1.676 m)  Weight: 170 lb (77.111 kg)  SpO2: 98%   Plain film of lumbar spine noted spondylitic listhesis at L4-L5 which was present prior CT. Areas of osteoarthritic changes noted. Plain film of right hip normal-unremarkable findings for acute osseous injury. Doubt cauda equina. Doubt epidural abscess. Suspicion to be sciatica. Negative signs of focal neurological deficits. Pulses palpable and strong. Full range of motion to upper lower extremities bilaterally. Strength intact. Gait proper. Patient stable, afebrile. Patient not septic appearing. Discharged patient. Discharge patient with prednisone. Discussed with patient to rest and apply heat and massage. Referred patient to primary care provider and orthopedics. Discussed with patient to closely monitor symptoms and if symptoms are to worsen or change to report back to the ED - strict return instructions given.  Patient agreed to plan of care, understood, all questions answered.   Jamse Mead, PA-C 07/28/14 0930  Jasper Riling. Alvino Chapel, MD 07/29/14 1660

## 2014-07-28 NOTE — ED Notes (Signed)
Pt reports pain to RT hip with out injury.Marland Kitchen

## 2014-09-29 ENCOUNTER — Other Ambulatory Visit: Payer: Self-pay | Admitting: Physical Medicine and Rehabilitation

## 2014-09-29 DIAGNOSIS — M5416 Radiculopathy, lumbar region: Secondary | ICD-10-CM

## 2014-10-10 ENCOUNTER — Ambulatory Visit
Admission: RE | Admit: 2014-10-10 | Discharge: 2014-10-10 | Disposition: A | Discharge status: Home / Self Care | Payer: Medicare Other | Source: Ambulatory Visit | Attending: Physical Medicine and Rehabilitation | Admitting: Physical Medicine and Rehabilitation

## 2014-10-10 DIAGNOSIS — M5416 Radiculopathy, lumbar region: Secondary | ICD-10-CM

## 2014-10-10 MED ORDER — GADOBENATE DIMEGLUMINE 529 MG/ML IV SOLN
15.0000 mL | Freq: Once | INTRAVENOUS | Status: AC | PRN
Start: 2014-10-10 — End: 2014-10-10
  Administered 2014-10-10: 15 mL via INTRAVENOUS

## 2014-10-15 ENCOUNTER — Encounter (HOSPITAL_COMMUNITY): Payer: Self-pay

## 2014-10-15 ENCOUNTER — Emergency Department (HOSPITAL_COMMUNITY)
Admission: EM | Admit: 2014-10-15 | Discharge: 2014-10-15 | Disposition: A | Discharge status: Home / Self Care | Payer: Medicare Other | Attending: Emergency Medicine | Admitting: Emergency Medicine

## 2014-10-15 DIAGNOSIS — L02211 Cutaneous abscess of abdominal wall: Secondary | ICD-10-CM | POA: Insufficient documentation

## 2014-10-15 DIAGNOSIS — Z79899 Other long term (current) drug therapy: Secondary | ICD-10-CM | POA: Insufficient documentation

## 2014-10-15 DIAGNOSIS — Z7952 Long term (current) use of systemic steroids: Secondary | ICD-10-CM | POA: Insufficient documentation

## 2014-10-15 DIAGNOSIS — Z8739 Personal history of other diseases of the musculoskeletal system and connective tissue: Secondary | ICD-10-CM | POA: Insufficient documentation

## 2014-10-15 DIAGNOSIS — Z8742 Personal history of other diseases of the female genital tract: Secondary | ICD-10-CM | POA: Diagnosis not present

## 2014-10-15 DIAGNOSIS — I10 Essential (primary) hypertension: Secondary | ICD-10-CM | POA: Insufficient documentation

## 2014-10-15 MED ORDER — SULFAMETHOXAZOLE-TRIMETHOPRIM 800-160 MG PO TABS: 1.0000 | ORAL_TABLET | Freq: Two times a day (BID) | ORAL | Status: DC

## 2014-10-15 MED ORDER — LIDOCAINE HCL 2 % IJ SOLN
20.0000 mL | Freq: Once | INTRAMUSCULAR | Status: AC
  Administered 2014-10-15: 50 mL
  Filled 2014-10-15: qty 20

## 2014-10-15 MED ORDER — HYDROCODONE-ACETAMINOPHEN 5-325 MG PO TABS
2.0000 | ORAL_TABLET | ORAL | Status: DC | PRN
Start: 2014-10-15 — End: 2018-04-02

## 2014-10-15 NOTE — Discharge Instructions (Signed)

## 2014-10-15 NOTE — ED Provider Notes (Signed)
CSN: [id number]     Arrival date & time 10/15/14  1436 History   First MD Initiated Contact with Patient 10/15/14 1611     Chief Complaint  Patient presents with  . Abscess     (Consider location/radiation/quality/duration/timing/severity/associated sxs/prior Treatment) Patient is a 44 y.o. female presenting with abscess. The history is provided by the patient. No language interpreter was used.  Abscess Location:  Torso Torso abscess location:  Abd RUQ Size:  3 Abscess quality: painful, redness and warmth  Draining:  cm.   Red streaking: no   Duration:  1 day Progression:  Worsening Pain details:    Quality:  Aching   Severity:  Moderate   Duration:  1 day   Timing:  Constant   Progression:  Worsening Context: not diabetes   Relieved by:  Nothing Worsened by:  Nothing tried Ineffective treatments:  Draining/squeezing Associated symptoms: no fever   Pt reports swollen painful area.  Pt reports she may have been bitten by something  Past Medical History  Diagnosis Date  . Hypertension   . Back pain   . Uterine fibroid    Past Surgical History  Procedure Laterality Date  . Back surgery    . Ectopic pregnancy surgery    . Tubal ligation     History reviewed. No pertinent family history. History  Substance Use Topics  . Smoking status: Never Smoker   . Smokeless tobacco: Not on file  . Alcohol Use: No   OB History    No data available     Review of Systems  Constitutional: Negative for fever.  All other systems reviewed and are negative.     Allergies  Bee venom; Codeine; Demerol; and Morphine and related  Home Medications   Prior to Admission medications   Medication Sig Start Date End Date Taking? Authorizing Provider  buPROPion (WELLBUTRIN XL) 300 MG 24 hr tablet Take 300 mg by mouth daily.   Yes Historical Provider, MD  clonazePAM (KLONOPIN) 1 MG tablet Take 1 mg by mouth 3 (three) times daily.   Yes Historical Provider, MD  diphenhydrAMINE  (BENADRYL) 25 MG tablet Take 25 mg by mouth every 6 (six) hours as needed for itching.   Yes Historical Provider, MD  DULoxetine (CYMBALTA) 60 MG capsule Take 60 mg by mouth daily.   Yes Historical Provider, MD  fentaNYL (DURAGESIC - DOSED MCG/HR) 25 MCG/HR patch Place 25 mcg onto the skin every 3 (three) days.   Yes Historical Provider, MD  gabapentin (NEURONTIN) 300 MG capsule Take 300 mg by mouth 3 (three) times daily.   Yes Historical Provider, MD  lisinopril-hydrochlorothiazide (PRINZIDE,ZESTORETIC) 10-12.5 MG per tablet Take 1 tablet by mouth daily.   Yes Historical Provider, MD  oxyCODONE-acetaminophen (PERCOCET) 10-325 MG per tablet Take 1 tablet by mouth every 4 (four) hours as needed for pain.   Yes Historical Provider, MD  predniSONE (DELTASONE) 10 MG tablet Take 2 tablets (20 mg total) by mouth daily. 07/28/14  Yes Marissa Sciacca, PA-C  temazepam (RESTORIL) 30 MG capsule Take 30 mg by mouth at bedtime. 02/10/14  Yes Historical Provider, MD  tiZANidine (ZANAFLEX) 2 MG tablet Take 2 mg by mouth every 6 (six) hours as needed for muscle spasms.   Yes Historical Provider, MD  traZODone (DESYREL) 50 MG tablet Take 50 mg by mouth at bedtime.   Yes Historical Provider, MD  zaleplon (SONATA) 5 MG capsule Take 5 mg by mouth at bedtime.   Yes Historical Provider, MD  doxycycline (  VIBRAMYCIN) 100 MG capsule Take 1 capsule (100 mg total) by mouth 2 (two) times daily. One po bid x 7 days 04/15/14   Marissa Sciacca, PA-C  HYDROcodone-acetaminophen (NORCO/VICODIN) 5-325 MG per tablet Take 2 tablets by mouth every 4 (four) hours as needed. 10/15/14   Fransico Meadow, PA-C  ondansetron (ZOFRAN ODT) 4 MG disintegrating tablet Take 1 tablet (4 mg total) by mouth every 8 (eight) hours as needed for nausea or vomiting. 04/18/14   Jennifer Piepenbrink, PA-C  sulfamethoxazole-trimethoprim (BACTRIM DS,SEPTRA DS) 800-160 MG per tablet Take 1 tablet by mouth 2 (two) times daily. 10/15/14 10/22/14  Fransico Meadow, PA-C    BP 123/82 mmHg  Pulse 73  Temp(Src) 99.3 F (37.4 C) (Oral)  Resp 13  Ht 5\' 6"  (1.676 m)  Wt 168 lb (76.204 kg)  BMI 27.13 kg/m2  SpO2 95% Physical Exam  Constitutional: She is oriented to person, place, and time. She appears well-developed and well-nourished.  HENT:  Head: Normocephalic.  Eyes: EOM are normal.  Neck: Normal range of motion.  Cardiovascular: Normal rate.   Pulmonary/Chest: Effort normal.  Abdominal: She exhibits no distension.  3cm round swollen area right abdomen  Pustule area center,   Musculoskeletal: Normal range of motion.  Neurological: She is alert and oriented to person, place, and time.  Psychiatric: She has a normal mood and affect.  Nursing note and vitals reviewed.   ED Course  INCISION AND DRAINAGE Date/Time: 10/15/2014 5:30 PM Performed by: Fransico Meadow Authorized by: Fransico Meadow Consent: Verbal consent not obtained. Risks and benefits: risks, benefits and alternatives were discussed Consent given by: patient Patient understanding: patient states understanding of the procedure being performed Patient identity confirmed: verbally with patient Time out: Immediately prior to procedure a "time out" was called to verify the correct patient, procedure, equipment, support staff and site/side marked as required. Type: abscess Body area: trunk Anesthesia: local infiltration Local anesthetic: lidocaine 1% without epinephrine Scalpel size: 11 Incision type: single straight Complexity: simple Drainage: bloody Wound treatment: wound left open Patient tolerance: Patient tolerated the procedure well with no immediate complications Comments: Small pustule center,  No purulent drainage,  Wound left open,    (including critical care time) Labs Review Labs Reviewed - No data to display  Imaging Review No results found.   EKG Interpretation   Date/Time:  Saturday October 15 2014 15:52:28 EDT Ventricular Rate:  76 PR Interval:  159 QRS  Duration: 97 QT Interval:  408 QTC Calculation: 459 R Axis:   -7 Text Interpretation:  Sinus rhythm Probable left ventricular hypertrophy  Borderline T abnormalities, anterior leads Baseline wander in lead(s) II  III aVF No significant change since last tracing Confirmed by ZACKOWSKI   MD, SCOTT ([id number]) on 10/15/2014 3:56:11 PM      MDM  Pt advised 2 day recheck here or urgent care.   Final diagnoses:  Abscess of abdominal wall   Meds ordered this encounter  Medications  . traZODone (DESYREL) 50 MG tablet    Sig: Take 50 mg by mouth at bedtime.  . fentaNYL (DURAGESIC - DOSED MCG/HR) 25 MCG/HR patch    Sig: Place 25 mcg onto the skin every 3 (three) days.  . diphenhydrAMINE (BENADRYL) 25 MG tablet    Sig: Take 25 mg by mouth every 6 (six) hours as needed for itching.  . lidocaine (XYLOCAINE) 2 % (with pres) injection 400 mg    Sig:   . sulfamethoxazole-trimethoprim (BACTRIM DS,SEPTRA DS) 800-160 MG per  tablet    Sig: Take 1 tablet by mouth 2 (two) times daily.    Dispense:  20 tablet    Refill:  0    Order Specific Question:  Supervising Provider    Answer:  MILLER, BRIAN [[id number]]  . HYDROcodone-acetaminophen (NORCO/VICODIN) 5-325 MG per tablet    Sig: Take 2 tablets by mouth every 4 (four) hours as needed.    Dispense:  16 tablet    Refill:  0    Order Specific Question:  Supervising Provider    Answer:  Noemi Chapel [place], PA-C 10/15/14 [place], MD 10/16/14 [phone]

## 2014-10-15 NOTE — ED Notes (Signed)
Onset yesterday pt noticed bump and redness on right side of abd.   Pt has nausea, vomited x 2 today, fever and muscle aches.

## 2014-10-18 ENCOUNTER — Observation Stay (HOSPITAL_COMMUNITY): Payer: Medicare Other

## 2014-10-18 ENCOUNTER — Encounter (HOSPITAL_COMMUNITY): Payer: Self-pay

## 2014-10-18 ENCOUNTER — Inpatient Hospital Stay (HOSPITAL_COMMUNITY)
Admission: EM | Admit: 2014-10-18 | Discharge: 2014-10-20 | DRG: 313 | Disposition: A | Discharge status: Home / Self Care | Payer: Medicare Other | Attending: Oncology | Admitting: Oncology

## 2014-10-18 ENCOUNTER — Emergency Department (HOSPITAL_COMMUNITY): Payer: Medicare Other

## 2014-10-18 DIAGNOSIS — E119 Type 2 diabetes mellitus without complications: Secondary | ICD-10-CM | POA: Diagnosis present

## 2014-10-18 DIAGNOSIS — G9520 Unspecified cord compression: Secondary | ICD-10-CM | POA: Diagnosis present

## 2014-10-18 DIAGNOSIS — M545 Low back pain: Secondary | ICD-10-CM | POA: Diagnosis present

## 2014-10-18 DIAGNOSIS — R7303 Prediabetes: Secondary | ICD-10-CM | POA: Diagnosis present

## 2014-10-18 DIAGNOSIS — E669 Obesity, unspecified: Secondary | ICD-10-CM | POA: Diagnosis present

## 2014-10-18 DIAGNOSIS — R7989 Other specified abnormal findings of blood chemistry: Secondary | ICD-10-CM | POA: Diagnosis present

## 2014-10-18 DIAGNOSIS — N179 Acute kidney failure, unspecified: Secondary | ICD-10-CM | POA: Diagnosis present

## 2014-10-18 DIAGNOSIS — F411 Generalized anxiety disorder: Secondary | ICD-10-CM | POA: Diagnosis present

## 2014-10-18 DIAGNOSIS — R0789 Other chest pain: Secondary | ICD-10-CM | POA: Diagnosis present

## 2014-10-18 DIAGNOSIS — N182 Chronic kidney disease, stage 2 (mild): Secondary | ICD-10-CM | POA: Diagnosis present

## 2014-10-18 DIAGNOSIS — L02211 Cutaneous abscess of abdominal wall: Secondary | ICD-10-CM | POA: Diagnosis present

## 2014-10-18 DIAGNOSIS — I10 Essential (primary) hypertension: Secondary | ICD-10-CM | POA: Diagnosis not present

## 2014-10-18 DIAGNOSIS — R9431 Abnormal electrocardiogram [ECG] [EKG]: Secondary | ICD-10-CM | POA: Diagnosis present

## 2014-10-18 DIAGNOSIS — R072 Precordial pain: Secondary | ICD-10-CM

## 2014-10-18 DIAGNOSIS — R079 Chest pain, unspecified: Secondary | ICD-10-CM | POA: Diagnosis present

## 2014-10-18 DIAGNOSIS — I129 Hypertensive chronic kidney disease with stage 1 through stage 4 chronic kidney disease, or unspecified chronic kidney disease: Secondary | ICD-10-CM | POA: Diagnosis present

## 2014-10-18 DIAGNOSIS — F419 Anxiety disorder, unspecified: Secondary | ICD-10-CM

## 2014-10-18 DIAGNOSIS — M5410 Radiculopathy, site unspecified: Secondary | ICD-10-CM | POA: Diagnosis present

## 2014-10-18 DIAGNOSIS — F329 Major depressive disorder, single episode, unspecified: Secondary | ICD-10-CM | POA: Diagnosis present

## 2014-10-18 DIAGNOSIS — M5416 Radiculopathy, lumbar region: Secondary | ICD-10-CM | POA: Diagnosis present

## 2014-10-18 DIAGNOSIS — G8929 Other chronic pain: Secondary | ICD-10-CM | POA: Diagnosis present

## 2014-10-18 DIAGNOSIS — D259 Leiomyoma of uterus, unspecified: Secondary | ICD-10-CM | POA: Diagnosis present

## 2014-10-18 DIAGNOSIS — F32A Depression, unspecified: Secondary | ICD-10-CM | POA: Diagnosis present

## 2014-10-18 DIAGNOSIS — K219 Gastro-esophageal reflux disease without esophagitis: Secondary | ICD-10-CM | POA: Diagnosis present

## 2014-10-18 DIAGNOSIS — M940 Chondrocostal junction syndrome [Tietze]: Secondary | ICD-10-CM | POA: Diagnosis present

## 2014-10-18 DIAGNOSIS — Z8249 Family history of ischemic heart disease and other diseases of the circulatory system: Secondary | ICD-10-CM

## 2014-10-18 DIAGNOSIS — E785 Hyperlipidemia, unspecified: Secondary | ICD-10-CM | POA: Diagnosis present

## 2014-10-18 DIAGNOSIS — Z6831 Body mass index (BMI) 31.0-31.9, adult: Secondary | ICD-10-CM

## 2014-10-18 DIAGNOSIS — Z79899 Other long term (current) drug therapy: Secondary | ICD-10-CM

## 2014-10-18 HISTORY — DX: Anxiety disorder, unspecified: F41.9

## 2014-10-18 LAB — URINE MICROSCOPIC-ADD ON

## 2014-10-18 LAB — BASIC METABOLIC PANEL
ANION GAP: 10 (ref 5–15)
BUN: 20 mg/dL (ref 6–23)
CO2: 26 mmol/L (ref 19–32)
Calcium: 9.6 mg/dL (ref 8.4–10.5)
Chloride: 97 mmol/L (ref 96–112)
Creatinine, Ser: 1.78 mg/dL — ABNORMAL HIGH (ref 0.50–1.10)
GFR calc non Af Amer: 34 mL/min — ABNORMAL LOW (ref >90)
GFR, EST AFRICAN AMERICAN: 39 mL/min — AB (ref >90)
Glucose, Bld: 126 mg/dL — ABNORMAL HIGH (ref 70–99)
Potassium: 4.1 mmol/L (ref 3.5–5.1)
SODIUM: 133 mmol/L — AB (ref 135–145)

## 2014-10-18 LAB — URINALYSIS, ROUTINE W REFLEX MICROSCOPIC
Bilirubin Urine: NEGATIVE
Glucose, UA: NEGATIVE mg/dL
Hgb urine dipstick: NEGATIVE
Ketones, ur: NEGATIVE mg/dL
NITRITE: NEGATIVE
PH: 5.5 (ref 5.0–8.0)
Protein, ur: NEGATIVE mg/dL
Specific Gravity, Urine: 1.01 (ref 1.005–1.030)
Urobilinogen, UA: 0.2 mg/dL (ref 0.0–1.0)

## 2014-10-18 LAB — I-STAT TROPONIN, ED: Troponin i, poc: 0.01 ng/mL (ref 0.00–0.08)

## 2014-10-18 LAB — SODIUM, URINE, RANDOM: Sodium, Ur: 25 mmol/L

## 2014-10-18 LAB — LIPID PANEL
Cholesterol: 218 mg/dL — ABNORMAL HIGH (ref 0–200)
HDL: 31 mg/dL — ABNORMAL LOW (ref >39)
LDL Cholesterol: 144 mg/dL — ABNORMAL HIGH (ref 0–99)
TRIGLYCERIDES: 216 mg/dL — AB (ref <150)
Total CHOL/HDL Ratio: 7 RATIO
VLDL: 43 mg/dL — AB (ref 0–40)

## 2014-10-18 LAB — CBC
HEMATOCRIT: 39.9 % (ref 36.0–46.0)
HEMOGLOBIN: 13.2 g/dL (ref 12.0–15.0)
MCH: 27.9 pg (ref 26.0–34.0)
MCHC: 33.1 g/dL (ref 30.0–36.0)
MCV: 84.4 fL (ref 78.0–100.0)
Platelets: 314 10*3/uL (ref 150–400)
RBC: 4.73 MIL/uL (ref 3.87–5.11)
RDW: 12.6 % (ref 11.5–15.5)
WBC: 11.4 10*3/uL — ABNORMAL HIGH (ref 4.0–10.5)

## 2014-10-18 LAB — TROPONIN I

## 2014-10-18 LAB — TSH: TSH: 2.08 u[IU]/mL (ref 0.350–4.500)

## 2014-10-18 LAB — MRSA PCR SCREENING: MRSA BY PCR: NEGATIVE

## 2014-10-18 LAB — PREGNANCY, URINE: Preg Test, Ur: NEGATIVE

## 2014-10-18 LAB — MAGNESIUM: Magnesium: 1.9 mg/dL (ref 1.5–2.5)

## 2014-10-18 MED ORDER — GABAPENTIN 300 MG PO CAPS
300.0000 mg | ORAL_CAPSULE | Freq: Three times a day (TID) | ORAL | Status: DC
  Administered 2014-10-18 – 2014-10-20 (×7): 300 mg via ORAL
  Filled 2014-10-18 (×8): qty 1

## 2014-10-18 MED ORDER — BUPROPION HCL ER (XL) 300 MG PO TB24
300.0000 mg | ORAL_TABLET | Freq: Every day | ORAL | Status: DC
Start: 2014-10-18 — End: 2014-10-20
  Administered 2014-10-18 – 2014-10-20 (×3): 300 mg via ORAL
  Filled 2014-10-18 (×3): qty 1

## 2014-10-18 MED ORDER — SODIUM CHLORIDE 0.9 % IV SOLN
INTRAVENOUS | Status: AC
  Administered 2014-10-18: 14:00:00 via INTRAVENOUS

## 2014-10-18 MED ORDER — PANTOPRAZOLE SODIUM 40 MG PO TBEC
40.0000 mg | DELAYED_RELEASE_TABLET | Freq: Every day | ORAL | Status: DC
  Administered 2014-10-18 – 2014-10-20 (×3): 40 mg via ORAL
  Filled 2014-10-18 (×3): qty 1

## 2014-10-18 MED ORDER — ONDANSETRON HCL 4 MG/2ML IJ SOLN: 4.0000 mg | Freq: Three times a day (TID) | INTRAMUSCULAR | Status: AC | PRN

## 2014-10-18 MED ORDER — DOXYCYCLINE HYCLATE 100 MG PO TABS
100.0000 mg | ORAL_TABLET | Freq: Two times a day (BID) | ORAL | Status: DC
  Filled 2014-10-18 (×2): qty 1

## 2014-10-18 MED ORDER — FENTANYL CITRATE (PF) 100 MCG/2ML IJ SOLN: 50.0000 ug | INTRAMUSCULAR | Status: DC | PRN

## 2014-10-18 MED ORDER — FENTANYL CITRATE (PF) 100 MCG/2ML IJ SOLN
50.0000 ug | Freq: Once | INTRAMUSCULAR | Status: AC
  Administered 2014-10-18: 50 ug via INTRAVENOUS
  Filled 2014-10-18: qty 2

## 2014-10-18 MED ORDER — ENOXAPARIN SODIUM 40 MG/0.4ML ~~LOC~~ SOLN
40.0000 mg | SUBCUTANEOUS | Status: DC
Start: 2014-10-18 — End: 2014-10-20
  Administered 2014-10-18 – 2014-10-19 (×2): 40 mg via SUBCUTANEOUS
  Filled 2014-10-18 (×3): qty 0.4

## 2014-10-18 MED ORDER — DULOXETINE HCL 60 MG PO CPEP
60.0000 mg | ORAL_CAPSULE | Freq: Every day | ORAL | Status: DC
  Administered 2014-10-18 – 2014-10-20 (×3): 60 mg via ORAL
  Filled 2014-10-18 (×3): qty 1

## 2014-10-18 MED ORDER — SULFAMETHOXAZOLE-TRIMETHOPRIM 800-160 MG PO TABS
1.0000 | ORAL_TABLET | Freq: Two times a day (BID) | ORAL | Status: DC
  Administered 2014-10-18 – 2014-10-19 (×3): 1 via ORAL
  Filled 2014-10-18 (×4): qty 1

## 2014-10-18 MED ORDER — SODIUM CHLORIDE 0.9 % IV SOLN
INTRAVENOUS | Status: DC
  Administered 2014-10-18: 15:00:00 via INTRAVENOUS

## 2014-10-18 MED ORDER — ACETAMINOPHEN 650 MG RE SUPP: 650.0000 mg | Freq: Four times a day (QID) | RECTAL | Status: DC | PRN

## 2014-10-18 MED ORDER — KETOROLAC TROMETHAMINE 30 MG/ML IJ SOLN
30.0000 mg | Freq: Once | INTRAMUSCULAR | Status: AC
  Administered 2014-10-18: 30 mg via INTRAVENOUS
  Filled 2014-10-18: qty 1

## 2014-10-18 MED ORDER — CLONAZEPAM 0.5 MG PO TABS
1.0000 mg | ORAL_TABLET | Freq: Three times a day (TID) | ORAL | Status: DC
  Administered 2014-10-18 – 2014-10-20 (×7): 1 mg via ORAL
  Filled 2014-10-18 (×2): qty 2
  Filled 2014-10-18 (×4): qty 1
  Filled 2014-10-18: qty 2

## 2014-10-18 MED ORDER — HYDROCODONE-ACETAMINOPHEN 5-325 MG PO TABS
2.0000 | ORAL_TABLET | ORAL | Status: DC | PRN
Start: 2014-10-18 — End: 2014-10-20
  Administered 2014-10-18 (×2): 2 via ORAL
  Filled 2014-10-18 (×2): qty 2

## 2014-10-18 MED ORDER — ACETAMINOPHEN 325 MG PO TABS
650.0000 mg | ORAL_TABLET | Freq: Four times a day (QID) | ORAL | Status: DC | PRN
  Administered 2014-10-19: 650 mg via ORAL
  Filled 2014-10-18: qty 2

## 2014-10-18 MED ORDER — TRAZODONE HCL 50 MG PO TABS
50.0000 mg | ORAL_TABLET | Freq: Every evening | ORAL | Status: DC | PRN
  Administered 2014-10-18: 50 mg via ORAL
  Filled 2014-10-18 (×2): qty 1

## 2014-10-18 MED ORDER — DIPHENHYDRAMINE HCL 25 MG PO TABS
25.0000 mg | ORAL_TABLET | Freq: Four times a day (QID) | ORAL | Status: DC | PRN
  Administered 2014-10-19: 25 mg via ORAL
  Filled 2014-10-18 (×2): qty 1

## 2014-10-18 MED ORDER — ATORVASTATIN CALCIUM 40 MG PO TABS
40.0000 mg | ORAL_TABLET | Freq: Every day | ORAL | Status: DC
  Administered 2014-10-18 – 2014-10-20 (×3): 40 mg via ORAL
  Filled 2014-10-18 (×4): qty 1

## 2014-10-18 MED ORDER — SODIUM CHLORIDE 0.9 % IV BOLUS (SEPSIS)
1000.0000 mL | Freq: Once | INTRAVENOUS | Status: AC
  Administered 2014-10-18: 1000 mL via INTRAVENOUS

## 2014-10-18 NOTE — H&P (Signed)
Date: 10/18/2014               Patient Name:  Taylor Bowman MRN: [id number]  DOB: 28-Jun-1971 Age / Sex: 44 y.o., female   PCP: No Pcp Per Patient         Medical Service: Internal Medicine Teaching Service         Attending Physician: Dr. Annia Belt, MD    First Contact: Jethro Poling, MS4 Pager: [phone]  Second Contact: Dr. Juluis Mire Pager: [phone]       After Hours (After 5p/  First Contact Pager: [phone]  weekends / holidays): Second Contact Pager: [phone]   Chief Complaint: Chest pain  History of Present Illness: Taylor Bowman is a 44 year old woman with PMH significant for anxiety, and HTN, presenting with 3-day history of chest pain that worsened acutely this morning. She first noticed the pain on Saturday when she was in the ED being seen for an abscess likely 2/2 spider bite on the abdomen (RLQ). At that time, she described the pain as sharp, and was relieved when she took deep breaths and rest. On Sunday, she reports experiencing "crushing" chest pain/pressure, again relieved with deep breaths. Then again this morning she reports a crushing substernal/left-sided pressure that radiated to her left shoulder and down to her left elbow. She noticed the pain when she stood up and said it is somewhat relieved with rest and deep breaths. She endorses HA, SOB, diaphoresis and dizziness this morning, but denies syncope. She also endorses a substernal vs epigastric "burning" sensation, denies any h/o GERD.  She took one percocet this morning but says it did not relieve the pain. She has no history of similar pain. She reports that the pain is intermittent, and occurs while at rest, and lasts 5-7 minutes. The pain is currently 5/10, had been as bad as 9/10. Received fentanyl in the ED which provided some relief. She is also reporting worsening nausea since Friday, but denies any emesis. She is concerned that she is having a heart attack, feels that her current chest pain is  provoking some anxiety - her mother died of a heart attack at the age of 22.  Denies any jaw pain, no pain with deep inspiration, no fever since Saturday (a/w abscess), no chills, no cough, no sore throat, no change in BMs. No recent falls, no injuries, denies any trauma or lifting any heavy objects. No recent URI symptoms. No history of asthma. She is also endorsing some stressors at home, specifically her 11 year old daughter has scarlet fever.   Meds: Current Facility-Administered Medications  Medication Dose Route Frequency Provider Last Rate Last Dose  . 0.9 %  sodium chloride infusion   Intravenous STAT Carmin Muskrat, MD 125 mL/hr at 10/18/14 1415    . 0.9 %  sodium chloride infusion   Intravenous Continuous Marjan Rabbani, MD 100 mL/hr at 10/18/14 1453    . acetaminophen (TYLENOL) tablet 650 mg  650 mg Oral Q6H PRN Juluis Mire, MD       Or  . acetaminophen (TYLENOL) suppository 650 mg  650 mg Rectal Q6H PRN Marjan Rabbani, MD      . buPROPion (WELLBUTRIN XL) 24 hr tablet 300 mg  300 mg Oral Daily Marjan Rabbani, MD      . clonazePAM (KLONOPIN) tablet 1 mg  1 mg Oral TID Marjan Rabbani, MD      . diphenhydrAMINE (BENADRYL) tablet 25 mg  25 mg Oral Q6H PRN Marjan  Rabbani, MD      . DULoxetine (CYMBALTA) DR capsule 60 mg  60 mg Oral Daily Marjan Rabbani, MD      . enoxaparin (LOVENOX) injection 40 mg  40 mg Subcutaneous Q24H Marjan Rabbani, MD      . gabapentin (NEURONTIN) capsule 300 mg  300 mg Oral TID Juluis Mire, MD      . HYDROcodone-acetaminophen (NORCO/VICODIN) 5-325 MG per tablet 2 tablet  2 tablet Oral Q4H PRN Juluis Mire, MD   2 tablet at 10/18/14 1453  . ondansetron (ZOFRAN) injection 4 mg  4 mg Intravenous Q8H PRN Carmin Muskrat, MD      . sulfamethoxazole-trimethoprim (BACTRIM DS,SEPTRA DS) 800-160 MG per tablet 1 tablet  1 tablet Oral BID Marjan Rabbani, MD      . traZODone (DESYREL) tablet 50 mg  50 mg Oral QHS PRN Juluis Mire, MD        Allergies: Allergies  as of 10/18/2014 - Review Complete 10/18/2014  Allergen Reaction Noted  . Bee venom Anaphylaxis and Hives 11/18/2013  . Codeine Hives 09/03/2011  . Demerol Hives 09/03/2011  . Morphine and related Hives and Itching 04/15/2014   Past Medical History  Diagnosis Date  . Hypertension   . Back pain   . Uterine fibroid    Past Surgical History  Procedure Laterality Date  . Back surgery    . Ectopic pregnancy surgery    . Tubal ligation     No family history on file. History   Social History  . Marital Status: Single    Spouse Name: N/A  . Number of Children: N/A  . Years of Education: N/A   Occupational History  . Not on file.   Social History Main Topics  . Smoking status: Never Smoker   . Smokeless tobacco: Not on file  . Alcohol Use: No  . Drug Use: No  . Sexual Activity: Yes    Birth Control/ Protection: Surgical   Other Topics Concern  . Not on file   Social History Narrative   Patient is a Marine scientist, currently unemployed. Lives at home with 67 y.o. Daughter.    Review of Systems: A comprehensive review of systems was negative except for: Respiratory: positive for SOB Cardiovascular: positive for chest pain, chest pressure/discomfort and dyspnea Gastrointestinal: positive for nausea   Physical Exam: Blood pressure 132/69, pulse 68, temperature 98.4 F (36.9 C), temperature source Oral, resp. rate 14, height 5\' 6"  (1.676 m), weight 89.2 kg (196 lb 10.4 oz), SpO2 99 %. General appearance: alert, cooperative and no distress Throat: lips, mucosa, and tongue normal; teeth and gums normal Lungs: clear to auscultation bilaterally Chest: TTP LUSB Heart: regular rate and rhythm, S1, S2 normal, no murmur, click, rub or gallop Abdomen: soft, non-tender; bowel sounds normal; no masses,  no organomegaly Extremities: extremities normal, atraumatic, no cyanosis or edema, negative straight leg test Skin: Skin color, texture, turgor normal. No rashes; non-tender,  non-erythematous, nonfluctuant, nondraining pustule RLQ of abdomen Neurologic: Grossly normal  Lab results: Basic Metabolic Panel:  Recent Labs  10/18/14 0752  NA 133*  K 4.1  CL 97  CO2 26  GLUCOSE 126*  BUN 20  CREATININE 1.78*  CALCIUM 9.6   CBC:  Recent Labs  10/18/14 0752  WBC 11.4*  HGB 13.2  HCT 39.9  MCV 84.4  PLT 314   Cardiac Enzymes:  Recent Labs  10/18/14 0752  TROPONINI <0.03   Imaging results:  Dg Chest 2 View  10/18/2014   CLINICAL  DATA:  Left chest pain  EXAM: CHEST  2 VIEW  COMPARISON:  02/24/2014  FINDINGS: The heart size and mediastinal contours are within normal limits. Both lungs are clear. The visualized skeletal structures are unremarkable.  IMPRESSION: No active cardiopulmonary disease.   Electronically Signed   By: Franchot Gallo M.D.   On: 10/18/2014 09:31    Other results: EKG: normal EKG, normal sinus rhythm, unchanged from previous tracings, T-wave abnormality.  Assessment & Plan by Problem: Active Problems:   Chest pain   Nerve compression   Generalized anxiety disorder   Type 2 diabetes mellitus   Hypertension   Abscess of skin of abdomen   AKI (acute kidney injury)   Prolonged Q-T interval on ECG  Chest pain: iStat troponin negative. Patient denies having had similar symptoms previously, although had an ED visit from 08/2011 at which time presented complaining of intermittent left-sided chest pain radiating into left neck and left arm x 1day. Had a normal EKG at that time and no further laboratory or work-up available in Epic. Currently patient is endorsing some nausea. Patient is TTP LUSB suggesting costochondritis as possible etiology. Patient also has some risk factors for ACS (HTN, DM, family h/o CAD). Unknown hypercholesterolemia. Denies tobacco use. Corresponding to TIMI risk score of I. Alternative etiology includes anxiety - patient is same age as her mother when her mother died from MI.  - cycle troponins x3 q6 hours -  repeat EKG in AM - heating pads - continue anti-anxiety meds - IV zofran 4mg  q8hrs prn - f/u lipid panel - f/u TSH - telemetry   Anxiety/depression: Denies any mood symptoms currently. Reports current anxiety is related to CP.  - continue duloxetine 60mg  Qday - continue clonazepam 1mg  TID - continue bupropion 300mg  Qday  AKI superimposed on CKD: Creatinine 1.78 on admission. Was wnl on 04/15/2014 (1.03), but high/normal previously (1.22 on 09/21/2010 during admission for PID). Was 0.8-0.9 in 2009.  - renal US - hold home lisinopril - avoid NSAIDs - IVF NS @ 100cc/hr x10 hours  T2DM: Denies having been told of possible diabetes dx in past. Family h/o DM (father). Hemoglobin A1C 6.6 on 10/18/2007 with CBG 186. Denies blurred vision, polyuria or polydipsia. CBG 126 on admission.    - f/u repeat A1c   HTN: Patient has been normotensive/hypotensive since admission. Hypotension seems to occur in setting of fentanyl administration. Received 1L NS bolus in ED. - d/c fentanyl - IVF NS @ 100cc/hr - hold home lisinopril-HCTZ in setting of AKI and hypotension  Prolonged QT: QTc .459 on EKG on 4/19 (0.450 in 10/2011) - f/u Mg - avoid QT-prolonging medications  Abdominal abscess: Patient presented to ED on 4/16 with complaint of ~3cm RLQ abdominal abscess. Reports having had a spider bite in the area. N/V and self-reported fever and muscle aches. Was afebrile. I&D in ED w/o purulent drainage - wound left open. Started on sulfamethoxazole-trimethoprim 800-160 BID for 7-day course. Mild leukocytosis (11.4) on admission, afebrile, not tachycardic, no other signs of infection. No WBC for comparison when patient presented initially for abscess on 4/16. Reports good adherence to abx - continue PO sulfamethoxazole-trimethoprim 800-160 BID (Day 3/7)  Nerve compression with associated backpain:  She saw Dr. Patrice Paradise ([facility]), received MRI significant for nerve compression at L4/L5.  She also has a history of right-sided LE radiculopathy for which she received a course of prednisone, completed >1 month ago. Has an appointment for surgery (fusion of discs) on 4/27.  -  continue fentanyl patch (38mcg q3days) - continue home gabapentin 300mg  TID - continue home hydrocodone-acetaminophen (NORCO) 5-325 Q4hrs prn for moderate pain  Diet: Regular DVT Ppx: Lovenox 40mg  Code: Full  Dispo: Disposition is deferred at this time, awaiting improvement of current medical problems. Anticipated discharge in approximately 1 day(s).   The patient does not have a current PCP (No Pcp Per Patient) and does need an [facility] hospital follow-up appointment after discharge.  The patient does not have transportation limitations that hinder transportation to clinic appointments.  Signed: Carolan Shiver, Med Student 10/18/2014, 3:40 PM

## 2014-10-18 NOTE — H&P (Signed)
Date: 10/18/2014               Patient Name:  Taylor Bowman MRN: [id number]  DOB: [birth year] Age / Sex: 44 y.o., female   PCP: No Pcp Per Patient         Medical Service: Internal Medicine Teaching Service         Attending Physician: Dr. Annia Belt, MD    First Contact: Jethro Poling, MS4  Pager: [phone]  Second Contact: Dr. Juluis Mire  Pager: [phone]       After Hours (After 5p/  First Contact Pager: [phone]  weekends / holidays): Second Contact Pager: [phone]   Chief Complaint: Chest Pain  History of Present Illness:   Taylor Bowman is 44 year old woman with past medical history of hypertension, uterine fibroids, and chronic low back pain with right sided radiculopathy who presents with chest pain of three day duration.   She was recently seen in the ED on 4/16 for a right sided abdominal abscess due to a spider bite that was drained. At that time she was also having chest pain and EKG revealed no ischemic changes. Cardiac enzymes were not checked. She reports that the chest pain as "sharp and crushing" left sided/centrally located that would last 5-7 minutes and would improve with rest and deep breathing. Pain would radiate to her left shoulder and forearm and was unrelieved with home percocet. She has not used nitroglycerin for the pain. One day later the chest pain was better but reoccurred this morning with associated dyspnea, diaphoresis, and dizziness. She denies similar chest pain in the past. She denies fever, chills, cough, heavy lifting, chest trauma, wheezing, palpitations, nausea, vomiting, abdominal pain, or syncope. She denies acid reflux symptoms. She denies history of thromboembolism. She reports being stressed out recently due to her 13 yr old daughter being sick with strep throat. She has history of anxiety and depression and has been compliant with taking her medications. Her mother died from MI at age 75. She was given fentanyl in the ED with improvement  of pain to 5/10 from 9/10.    Meds:   No current facility-administered medications on file prior to encounter.   Current Outpatient Prescriptions on File Prior to Encounter  Medication Sig Dispense Refill  . buPROPion (WELLBUTRIN XL) 300 MG 24 hr tablet Take 300 mg by mouth daily.    . clonazePAM (KLONOPIN) 1 MG tablet Take 1 mg by mouth 3 (three) times daily.    . diphenhydrAMINE (BENADRYL) 25 MG tablet Take 25 mg by mouth every 6 (six) hours as needed for itching.    . DULoxetine (CYMBALTA) 60 MG capsule Take 60 mg by mouth daily.    . fentaNYL (DURAGESIC - DOSED MCG/HR) 25 MCG/HR patch Place 25 mcg onto the skin every 3 (three) days.    Marland Kitchen gabapentin (NEURONTIN) 300 MG capsule Take 300 mg by mouth 3 (three) times daily.    Marland Kitchen lisinopril-hydrochlorothiazide (PRINZIDE,ZESTORETIC) 10-12.5 MG per tablet Take 1 tablet by mouth daily.    Marland Kitchen oxyCODONE-acetaminophen (PERCOCET) 10-325 MG per tablet Take 1 tablet by mouth every 4 (four) hours as needed for pain.    Marland Kitchen sulfamethoxazole-trimethoprim (BACTRIM DS,SEPTRA DS) 800-160 MG per tablet Take 1 tablet by mouth 2 (two) times daily. 20 tablet 0  . temazepam (RESTORIL) 30 MG capsule Take 30 mg by mouth at bedtime.    Marland Kitchen tiZANidine (ZANAFLEX) 2 MG tablet Take 2 mg by mouth every 6 (six) hours as needed for  muscle spasms.    . traZODone (DESYREL) 50 MG tablet Take 50 mg by mouth at bedtime.    Marland Kitchen doxycycline (VIBRAMYCIN) 100 MG capsule Take 1 capsule (100 mg total) by mouth 2 (two) times daily. One po bid x 7 days 14 capsule 0  . HYDROcodone-acetaminophen (NORCO/VICODIN) 5-325 MG per tablet Take 2 tablets by mouth every 4 (four) hours as needed. 16 tablet 0  . ondansetron (ZOFRAN ODT) 4 MG disintegrating tablet Take 1 tablet (4 mg total) by mouth every 8 (eight) hours as needed for nausea or vomiting. 20 tablet 0  . predniSONE (DELTASONE) 10 MG tablet Take 2 tablets (20 mg total) by mouth daily. 10 tablet 0   Allergies: Allergies as of 10/18/2014 -  Review Complete 10/18/2014  Allergen Reaction Noted  . Bee venom Anaphylaxis and Hives 11/18/2013  . Codeine Hives 09/03/2011  . Demerol Hives 09/03/2011  . Morphine and related Hives and Itching 04/15/2014   Past Medical History  Diagnosis Date  . Hypertension   . Back pain   . Uterine fibroid    Past Surgical History  Procedure Laterality Date  . Back surgery    . Ectopic pregnancy surgery    . Tubal ligation     Family History  Problem Relation Age of Onset  . Diabetes Father   . Heart failure Mother    History   Social History  . Marital Status: Single    Spouse Name: N/A  . Number of Children: N/A  . Years of Education: N/A   Occupational History  . Not on file.   Social History Main Topics  . Smoking status: Never Smoker   . Smokeless tobacco: Not on file  . Alcohol Use: No  . Drug Use: No  . Sexual Activity: Yes    Birth Control/ Protection: Surgical   Other Topics Concern  . Not on file   Social History Narrative   Patient is a Marine scientist, currently unemployed. Lives at home with 70 y.o. Daughter.    Review of Systems: Pertinent items are noted in HPI.  Physical Exam: Blood pressure 116/72, pulse 62, temperature 98.2 F (36.8 C), temperature source Oral, resp. rate 11, height 5\' 6"  (1.676 m), weight 196 lb 10.4 oz (89.2 kg), SpO2 97 %.  PHYSICAL EXAMINATION:  General: NAD, obese Heart: Normal rate and rhythm with no murmurs or rub. Tenderness to palpation of midsternum region.   Lungs: Clear to ausculation bilaterally with no rales, ronchi, or wheezing. Abdomen: Well-healing RUQ wound with no signs of infection. Non-tender, non-distended with normal BS.  Extremities: Trace b/l edema.  Neuro: A & O x 3   Lab results: Basic Metabolic Panel:  Recent Labs  10/18/14 0752  NA 133*  K 4.1  CL 97  CO2 26  GLUCOSE 126*  BUN 20  CREATININE 1.78*  CALCIUM 9.6   Liver Function Tests: No results for input(s): AST, ALT, ALKPHOS, BILITOT, PROT,  ALBUMIN in the last 72 hours. No results for input(s): LIPASE, AMYLASE in the last 72 hours. No results for input(s): AMMONIA in the last 72 hours. CBC:  Recent Labs  10/18/14 0752  WBC 11.4*  HGB 13.2  HCT 39.9  MCV 84.4  PLT 314   Cardiac Enzymes:  Recent Labs  10/18/14 0752  TROPONINI <0.03   BNP: No results for input(s): PROBNP in the last 72 hours. D-Dimer: No results for input(s): DDIMER in the last 72 hours. CBG: No results for input(s): GLUCAP in the last 72  hours. Hemoglobin A1C: No results for input(s): HGBA1C in the last 72 hours. Fasting Lipid Panel:  Recent Labs  10/18/14 1655  CHOL 218*  HDL 31*  LDLCALC 144*  TRIG 216*  CHOLHDL 7.0   Thyroid Function Tests:  Recent Labs  10/18/14 1655  TSH 2.080   Anemia Panel: No results for input(s): VITAMINB12, FOLATE, FERRITIN, TIBC, IRON, RETICCTPCT in the last 72 hours. Coagulation: No results for input(s): LABPROT, INR in the last 72 hours. Urine Drug Screen: Drugs of Abuse  No results found for: LABOPIA, COCAINSCRNUR, LABBENZ, AMPHETMU, THCU, LABBARB  Alcohol Level: No results for input(s): ETH in the last 72 hours. Urinalysis:  Recent Labs  10/18/14 1518  COLORURINE YELLOW  LABSPEC 1.010  PHURINE 5.5  GLUCOSEU NEGATIVE  HGBUR NEGATIVE  BILIRUBINUR NEGATIVE  KETONESUR NEGATIVE  PROTEINUR NEGATIVE  UROBILINOGEN 0.2  NITRITE NEGATIVE  LEUKOCYTESUR SMALL*   Imaging results:  Dg Chest 2 View  10/18/2014   CLINICAL DATA:  Left chest pain  EXAM: CHEST  2 VIEW  COMPARISON:  02/24/2014  FINDINGS: The heart size and mediastinal contours are within normal limits. Both lungs are clear. The visualized skeletal structures are unremarkable.  IMPRESSION: No active cardiopulmonary disease.   Electronically Signed   By: Franchot Gallo M.D.   On: 10/18/2014 09:31    Other results: EKG:  Ventricular Rate: 74 PR Interval: 148 QRS Duration: 82 QT Interval: 414 QTC Calculation: 459 R Axis:  38 Text Interpretation: Normal sinus rhythm Normal ECG Sinus rhythm T wave abnormality   Assessment & Plan by Problem: Active Problems:   Chest pain   Nerve compression   Generalized anxiety disorder   Type 2 diabetes mellitus   Hypertension   Abscess of skin of abdomen   AKI (acute kidney injury)   Prolonged Q-T interval on ECG  Chest Pain - Pt with 3-day history of central/left sided chest pain with tenderness to palpation at sternum most likely due to costochondritis given her age however denies heavy lifting or chest trauma. Her initial troponin was negative and 12-lead EKG with no ischemic changes. She has TIMI score of 0 but with significant FH of mother with MI at same age. Pt with history of anxiety compliant with medications but with recent stressors that may be contributory. GERD also possible given obesity however denies reflux symptoms. PE less likely with Well's score of zero. CXR on admission with no acute abnormality.   -Monitor on telemetry -Cycle troponins x 3 -Repeat 12-lead EKG in AM -Trial of protonix 40 mg daily   -Continue cymbalta 60 mg daily, klonopin 1 mg TID, and bupropion 300 mg daily for mood disorder -Risk stratification with A1c, lipid panel, and TSH -Heat pad to area and continue home norco/vicodin 5-325 mg Q 4 hr PRN (allergy to morphine) -Obtain urine pregnancy test  Abdominal abscess s/p I & D - Pt with recent I & D on 4/16 due to right abdominal abscess in setting of recent spider bite that is well-healing. Pt afebrile with mild leukocytosis on admission of 11.4K. -Continue bactrim 800-160 mg BID Day 3/7 -Repeat CBC w/diff  Hypertension - Currently normotensive. Pt at home on lisinopril-HCTZ 10-12.5 mg daily.  -Hold lisinopril-HCTZ in setting of AKI and volume depletion  Non-oliguric AKI on probable CKD Stage 2 - Cr 1.78 with last Cr 6 months ago 1.03. Pt denies FH of kidney disease. Pt with hypertension and probable Type 2 DM. She has not had renal  US in the past. -NS  125 mL/hr for 12 hrs -Obtain renal US -Obtain UA, urine sodium, and urine urea -Monitor daily weights and strict I/O's -Avoid nephrotoxins  Anxiety and depression - Pt with possible anxiety in setting of recent stressors, currently with stable mood.  -Continue duloxetine 60 mg daily -Continue clonazepam 1 mg TID -Continue bupropion 300 mg daily  Chronic low back pain with right sided L4/5 radiculopathy - Pt follows with Dr. Patrice Paradise ([facility]). She received MRI on 10/10/14 significant for nerve compression at L4/L5.   -Continue fentanyl patch (25 mcg q3days) -Continue home gabapentin 300 mg TID -Continue home norco 5-325 Q 4hr PRN -Pt to follow-up for surgical consultation on 4/27   Probable Type 2 Diabetes Mellitus - Last A1c was 6.6 on 10/18/07. She denies symptoms. Fasting glucose elevated at 126. -Recheck A1c   HIV screening - No past screening.  -Obtain HIV Ab  Diet: Regular DVT Ppx: Lovenox  Code: Full    Dispo: Disposition is deferred at this time, awaiting improvement of current medical problems. Anticipated discharge in approximately 1-2 day(s).   The patient does not have a current PCP (No Pcp Per Patient) and does need an [facility] hospital follow-up appointment after discharge.  The patient does not have transportation limitations that hinder transportation to clinic appointments.  Signed: Juluis Mire, MD 10/18/2014, 7:58 PM   Medicine attending admission note: I personally interviewed and examined this patient, reviewed pertinent clinical, laboratory, radiographic, and EKG data and I attest to the accuracy of the evaluation and management plan as recorded by resident physician Dr. Juluis Mire and acting intern Ms. Jethro Poling.  Clinical summary: Anxious 44 year old woman who lost her mother at age 63 with a myocardial infarction. She is obese, hypertensive, prediabetic, and has a moderate elevation of LDL cholesterol.  She is a nonsmoker. She presents with a 72 hour history of left-sided, variably described as crushing or sharp, chest pain, radiating to the left arm and shoulder partially relieved by deep breathing. She has no signs or symptoms of infection. No prior history of emboli. No symptoms to suggest reflux esophagitis.  Initial exam by Dr. Naaman Plummer: Obese African-American woman in no acute distress Blood pressure 116/72, pulse 62, temperature 98.2 F (36.8 C), temperature source Oral, resp. rate 11, height 5\' 6"   (1.676 m), weight 196 lb 10.4 oz (89.2 kg), SpO2 97 %. Lungs clear. Regular cardiac rhythm without murmur or rub. No edema or calf tenderness.  Current exam: Blood pressure 126/63, pulse 71, temperature 98.8 F (37.1 C), temperature source Oral, resp. rate 14, height 5\' 6"  (1.676 m), weight 207 lb 0.2 oz (93.9 kg), SpO2 97 %./ No change compared with the exam recorded above by Dr. Naaman Plummer. No jugular venous distention. Regular cardiac rhythm. No murmur gallop or rub. No chest wall tenderness on my exam,  no-costo- chondral tenderness. No epigastric abdominal tenderness. Small, 2 cm area lower right abdominal wall cover with a dressing where she had a recent incision and drainage of a small abscess.  She has a tattoo at the base of her spine and scar from previous surgery.  Pertinent lab: White count 11,400, repeat this morning 8100 with 45% neutrophils, 45% lymphocytes, 6 monocytes, hemoglobin 12 BUN 20, creatinine 1.8 with previous value from October 2015, 1.0  Troponin undetectable 3  EKG: Sinus rhythm no acute ischemic changes. T waves inverted in lead V1 no change from a 09/03/2011 tracing.  Chest x-ray: Normal  Impression: #1. Atypical chest pain. No evidence for acute  myocardial damage based on negative troponins and normal cardiogram. However, she has a number of risk factors and is most concerned with early coronary disease and death in her mother. I don't think it would be  unreasonable to arrange for a stress Myoview study to reassure her that her chest pain is not ischemic in nature. We are going to put her on a trial of a proton pump inhibitor to see if there is a element of reflux contributing to her symptoms.  #2. Elevated creatinine Not clear whether or not this is a drug reaction to Septra started for her abdominal skin infection. Already improving with some overnight hydration. We are going to stop the Septra.

## 2014-10-18 NOTE — ED Provider Notes (Signed)
CSN: [id number]     Arrival date & time 10/18/14  9629 History   First MD Initiated Contact with Patient 10/18/14 [phone]     Chief Complaint  Patient presents with  . Chest Pain     (Consider location/radiation/quality/duration/timing/severity/associated sxs/prior Treatment) HPI Patient presents with concern of chest pain. Pain is been present for 4 days, started subtly. Since onset pain has been present throughout, though with waxing/waning exacerbations. Pain is worse with activity, coughing, laughing. Pain is left-sided, sore, nonradiating. No relief with anything. No syncope, though the patient does feel lightheaded/fatigue. No fever, vomiting, diarrhea. Patient has history of hypertension, family history notable for mother dying of cardiac disease at age 61.  Patient was recently evaluated for possible abscess. She notes that this area, right lower abdomen is unchanged, with no new erythema, discharge.  Past Medical History  Diagnosis Date  . Hypertension   . Back pain   . Uterine fibroid    Past Surgical History  Procedure Laterality Date  . Back surgery    . Ectopic pregnancy surgery    . Tubal ligation     No family history on file. History  Substance Use Topics  . Smoking status: Never Smoker   . Smokeless tobacco: Not on file  . Alcohol Use: No   OB History    No data available     Review of Systems  Constitutional:       Per HPI, otherwise negative  HENT:       Per HPI, otherwise negative  Respiratory:       Per HPI, otherwise negative  Cardiovascular:       Per HPI, otherwise negative  Gastrointestinal: Negative for vomiting.  Endocrine:       Negative aside from HPI  Genitourinary:       Neg aside from HPI   Musculoskeletal:       Per HPI, otherwise negative  Skin: Positive for wound.  Neurological: Negative for syncope.      Allergies  Bee venom; Codeine; Demerol; and Morphine and related  Home Medications   Prior to Admission  medications   Medication Sig Start Date End Date Taking? Authorizing Provider  buPROPion (WELLBUTRIN XL) 300 MG 24 hr tablet Take 300 mg by mouth daily.    Historical Provider, MD  clonazePAM (KLONOPIN) 1 MG tablet Take 1 mg by mouth 3 (three) times daily.    Historical Provider, MD  diphenhydrAMINE (BENADRYL) 25 MG tablet Take 25 mg by mouth every 6 (six) hours as needed for itching.    Historical Provider, MD  doxycycline (VIBRAMYCIN) 100 MG capsule Take 1 capsule (100 mg total) by mouth 2 (two) times daily. One po bid x 7 days 04/15/14   Marissa Sciacca, PA-C  DULoxetine (CYMBALTA) 60 MG capsule Take 60 mg by mouth daily.    Historical Provider, MD  fentaNYL (DURAGESIC - DOSED MCG/HR) 25 MCG/HR patch Place 25 mcg onto the skin every 3 (three) days.    Historical Provider, MD  gabapentin (NEURONTIN) 300 MG capsule Take 300 mg by mouth 3 (three) times daily.    Historical Provider, MD  HYDROcodone-acetaminophen (NORCO/VICODIN) 5-325 MG per tablet Take 2 tablets by mouth every 4 (four) hours as needed. 10/15/14   Fransico Meadow, PA-C  lisinopril-hydrochlorothiazide (PRINZIDE,ZESTORETIC) 10-12.5 MG per tablet Take 1 tablet by mouth daily.    Historical Provider, MD  ondansetron (ZOFRAN ODT) 4 MG disintegrating tablet Take 1 tablet (4 mg total) by mouth every 8 (eight) hours  as needed for nausea or vomiting. 04/18/14   Jennifer Piepenbrink, PA-C  oxyCODONE-acetaminophen (PERCOCET) 10-325 MG per tablet Take 1 tablet by mouth every 4 (four) hours as needed for pain.    Historical Provider, MD  predniSONE (DELTASONE) 10 MG tablet Take 2 tablets (20 mg total) by mouth daily. 07/28/14   Marissa Sciacca, PA-C  sulfamethoxazole-trimethoprim (BACTRIM DS,SEPTRA DS) 800-160 MG per tablet Take 1 tablet by mouth 2 (two) times daily. 10/15/14 10/22/14  Fransico Meadow, PA-C  temazepam (RESTORIL) 30 MG capsule Take 30 mg by mouth at bedtime. 02/10/14   Historical Provider, MD  tiZANidine (ZANAFLEX) 2 MG tablet Take 2 mg  by mouth every 6 (six) hours as needed for muscle spasms.    Historical Provider, MD  traZODone (DESYREL) 50 MG tablet Take 50 mg by mouth at bedtime.    Historical Provider, MD  zaleplon (SONATA) 5 MG capsule Take 5 mg by mouth at bedtime.    Historical Provider, MD   BP 92/56 mmHg  Pulse 73  Temp(Src) 98 F (36.7 C) (Oral)  Resp 14  Ht 5\' 6"  (1.676 m)  Wt 168 lb (76.204 kg)  BMI 27.13 kg/m2  SpO2 100% Physical Exam  Constitutional: She is oriented to person, place, and time. She appears well-developed and well-nourished. No distress.  HENT:  Head: Normocephalic and atraumatic.  Eyes: Conjunctivae and EOM are normal.  Cardiovascular: Normal rate and regular rhythm.   Pulmonary/Chest: Effort normal and breath sounds normal. No stridor. No respiratory distress.  Abdominal: She exhibits no distension. There is no tenderness.    Musculoskeletal: She exhibits no edema.  Neurological: She is alert and oriented to person, place, and time. No cranial nerve deficit.  Skin: Skin is warm and dry.  Psychiatric: She has a normal mood and affect.  Nursing note and vitals reviewed.   ED Course  Procedures (including critical care time) Labs Review Labs Reviewed  BASIC METABOLIC PANEL - Abnormal; Notable for the following:    Sodium 133 (*)    Glucose, Bld 126 (*)    Creatinine, Ser 1.78 (*)    GFR calc non Af Amer 34 (*)    GFR calc Af Amer 39 (*)    All other components within normal limits  CBC - Abnormal; Notable for the following:    WBC 11.4 (*)    All other components within normal limits  TROPONIN I  I-STAT TROPOININ, ED    Imaging Review Dg Chest 2 View  10/18/2014   CLINICAL DATA:  Left chest pain  EXAM: CHEST  2 VIEW  COMPARISON:  02/24/2014  FINDINGS: The heart size and mediastinal contours are within normal limits. Both lungs are clear. The visualized skeletal structures are unremarkable.  IMPRESSION: No active cardiopulmonary disease.   Electronically Signed   By:  Franchot Gallo M.D.   On: 10/18/2014 09:31     EKG Interpretation   Date/Time:  Tuesday October 18 2014 07:39:48 EDT Ventricular Rate:  74 PR Interval:  148 QRS Duration: 82 QT Interval:  414 QTC Calculation: 459 R Axis:   38 Text Interpretation:  Normal sinus rhythm Normal ECG Sinus rhythm T wave  abnormality Borderline ECG Confirmed by Carmin Muskrat  MD ([id number]) on  10/18/2014 7:43:26 AM     Pulse ox 100% room air normal Cardiac 75 sinus rhythm normal  Review of chart chemistries recent evaluation for abscess  10:17 AM HR 70, BP 125/70 - there was a misplaced cuff for several readings.  11:54  AM Patient continues to have pain after pulses of fentanyl. Now blood pressure 105/60. No dyspnea. Patient has had fluid resuscitation, but with continued hypotension, continuing episodic pain, and new T wave changes in her inferior lead, she'll be admitted for further evaluation and management. MDM   Patient presents with ongoing chest pain. Here the patient is awake, alert. Vital signs are notable for hypotension, in spite of fluid resuscitation. Pain is controlled intermittently with fentanyl, in addition to the patient's fentanyl patch. Patient has minor EKG changes, and given her a notable family history of early cardiac death she was admitted for further evaluation and management.     Carmin Muskrat, MD 10/18/14 1155

## 2014-10-18 NOTE — ED Notes (Signed)
Pt placed on continuous pulse ox, BP and 5-lead cardiac monitor. Daughter at bedside.

## 2014-10-18 NOTE — ED Notes (Signed)
Pt

## 2014-10-18 NOTE — ED Notes (Signed)
Pt leaving department for xray at this time. Leads removed from chest before leaving.

## 2014-10-18 NOTE — ED Notes (Signed)
Pt. Complaint of CP starting Saturday. Pt. Describes pain as left sided crushing/pressure intermittently with radiation to L shoulder. Pt. States she feels diaphoretic with CP and SOB. Pt. States she was seen for spider bite last week to R lower abd.

## 2014-10-19 ENCOUNTER — Encounter (HOSPITAL_COMMUNITY): Payer: Self-pay | Admitting: Cardiology

## 2014-10-19 DIAGNOSIS — R0789 Other chest pain: Secondary | ICD-10-CM | POA: Diagnosis present

## 2014-10-19 DIAGNOSIS — K219 Gastro-esophageal reflux disease without esophagitis: Secondary | ICD-10-CM | POA: Diagnosis present

## 2014-10-19 DIAGNOSIS — D259 Leiomyoma of uterus, unspecified: Secondary | ICD-10-CM | POA: Diagnosis present

## 2014-10-19 DIAGNOSIS — Z8249 Family history of ischemic heart disease and other diseases of the circulatory system: Secondary | ICD-10-CM | POA: Diagnosis not present

## 2014-10-19 DIAGNOSIS — N182 Chronic kidney disease, stage 2 (mild): Secondary | ICD-10-CM | POA: Diagnosis present

## 2014-10-19 DIAGNOSIS — G8929 Other chronic pain: Secondary | ICD-10-CM | POA: Diagnosis present

## 2014-10-19 DIAGNOSIS — F329 Major depressive disorder, single episode, unspecified: Secondary | ICD-10-CM | POA: Diagnosis present

## 2014-10-19 DIAGNOSIS — N179 Acute kidney failure, unspecified: Secondary | ICD-10-CM

## 2014-10-19 DIAGNOSIS — M5416 Radiculopathy, lumbar region: Secondary | ICD-10-CM

## 2014-10-19 DIAGNOSIS — I10 Essential (primary) hypertension: Secondary | ICD-10-CM | POA: Diagnosis not present

## 2014-10-19 DIAGNOSIS — I129 Hypertensive chronic kidney disease with stage 1 through stage 4 chronic kidney disease, or unspecified chronic kidney disease: Secondary | ICD-10-CM | POA: Diagnosis present

## 2014-10-19 DIAGNOSIS — E785 Hyperlipidemia, unspecified: Secondary | ICD-10-CM | POA: Diagnosis present

## 2014-10-19 DIAGNOSIS — R072 Precordial pain: Secondary | ICD-10-CM | POA: Diagnosis not present

## 2014-10-19 DIAGNOSIS — L02211 Cutaneous abscess of abdominal wall: Secondary | ICD-10-CM

## 2014-10-19 DIAGNOSIS — M940 Chondrocostal junction syndrome [Tietze]: Secondary | ICD-10-CM | POA: Diagnosis present

## 2014-10-19 DIAGNOSIS — Z6831 Body mass index (BMI) 31.0-31.9, adult: Secondary | ICD-10-CM | POA: Diagnosis not present

## 2014-10-19 DIAGNOSIS — E119 Type 2 diabetes mellitus without complications: Secondary | ICD-10-CM | POA: Diagnosis present

## 2014-10-19 DIAGNOSIS — R7301 Impaired fasting glucose: Secondary | ICD-10-CM

## 2014-10-19 DIAGNOSIS — G9520 Unspecified cord compression: Secondary | ICD-10-CM | POA: Diagnosis present

## 2014-10-19 DIAGNOSIS — F411 Generalized anxiety disorder: Secondary | ICD-10-CM | POA: Diagnosis present

## 2014-10-19 DIAGNOSIS — F418 Other specified anxiety disorders: Secondary | ICD-10-CM

## 2014-10-19 DIAGNOSIS — B9689 Other specified bacterial agents as the cause of diseases classified elsewhere: Secondary | ICD-10-CM

## 2014-10-19 DIAGNOSIS — R079 Chest pain, unspecified: Secondary | ICD-10-CM | POA: Diagnosis not present

## 2014-10-19 DIAGNOSIS — Z9889 Other specified postprocedural states: Secondary | ICD-10-CM

## 2014-10-19 DIAGNOSIS — R748 Abnormal levels of other serum enzymes: Secondary | ICD-10-CM | POA: Diagnosis not present

## 2014-10-19 DIAGNOSIS — Z79899 Other long term (current) drug therapy: Secondary | ICD-10-CM | POA: Diagnosis not present

## 2014-10-19 DIAGNOSIS — M545 Low back pain: Secondary | ICD-10-CM | POA: Diagnosis present

## 2014-10-19 DIAGNOSIS — R7989 Other specified abnormal findings of blood chemistry: Secondary | ICD-10-CM | POA: Diagnosis present

## 2014-10-19 DIAGNOSIS — E669 Obesity, unspecified: Secondary | ICD-10-CM | POA: Diagnosis present

## 2014-10-19 LAB — TROPONIN I: Troponin I: <0.03 ng/mL (ref <0.031)

## 2014-10-19 LAB — BASIC METABOLIC PANEL
ANION GAP: 9 (ref 5–15)
BUN: 22 mg/dL (ref 6–23)
CHLORIDE: 103 mmol/L (ref 96–112)
CO2: 24 mmol/L (ref 19–32)
Calcium: 9 mg/dL (ref 8.4–10.5)
Creatinine, Ser: 1.6 mg/dL — ABNORMAL HIGH (ref 0.50–1.10)
GFR calc Af Amer: 44 mL/min — ABNORMAL LOW (ref >90)
GFR calc non Af Amer: 38 mL/min — ABNORMAL LOW (ref >90)
Glucose, Bld: 100 mg/dL — ABNORMAL HIGH (ref 70–99)
Potassium: 3.6 mmol/L (ref 3.5–5.1)
Sodium: 136 mmol/L (ref 135–145)

## 2014-10-19 LAB — CBC WITH DIFFERENTIAL/PLATELET
BASOS ABS: 0.1 10*3/uL (ref 0.0–0.1)
BASOS PCT: 1 % (ref 0–1)
EOS ABS: 0.3 10*3/uL (ref 0.0–0.7)
Eosinophils Relative: 3 % (ref 0–5)
HCT: 36 % (ref 36.0–46.0)
HEMOGLOBIN: 12 g/dL (ref 12.0–15.0)
Lymphocytes Relative: 45 % (ref 12–46)
Lymphs Abs: 3.7 10*3/uL (ref 0.7–4.0)
MCH: 27.9 pg (ref 26.0–34.0)
MCHC: 33.3 g/dL (ref 30.0–36.0)
MCV: 83.7 fL (ref 78.0–100.0)
Monocytes Absolute: 0.5 10*3/uL (ref 0.1–1.0)
Monocytes Relative: 6 % (ref 3–12)
NEUTROS PCT: 45 % (ref 43–77)
Neutro Abs: 3.6 10*3/uL (ref 1.7–7.7)
Platelets: 274 10*3/uL (ref 150–400)
RBC: 4.3 MIL/uL (ref 3.87–5.11)
RDW: 12.4 % (ref 11.5–15.5)
WBC: 8.1 10*3/uL (ref 4.0–10.5)

## 2014-10-19 LAB — UREA NITROGEN, URINE: UREA NITROGEN UR: 453 mg/dL

## 2014-10-19 LAB — CREATININE, URINE, RANDOM: Creatinine, Urine: 103.45 mg/dL

## 2014-10-19 LAB — HIV ANTIBODY (ROUTINE TESTING W REFLEX): HIV Screen 4th Generation wRfx: NONREACTIVE

## 2014-10-19 MED ORDER — ONDANSETRON HCL 4 MG/2ML IJ SOLN: 4.0000 mg | Freq: Four times a day (QID) | INTRAMUSCULAR | Status: DC | PRN

## 2014-10-19 NOTE — Progress Notes (Signed)
Subjective: Patient is resting in bed with family in room. Appears somewhat reassured by news of EKG and negative troponins but continues to express concern re: possible cardiac etiology with her mother's MI at the same age and with a negative work-up. Denies any chest pain since last night. Did not receive any heating pads. No SOB, no chills/fever. Would like to pursue further work-up with stress test.   Objective: Vital signs in last 24 hours: Filed Vitals:   10/19/14 0317 10/19/14 0325 10/19/14 0740 10/19/14 0747  BP: 103/54  127/76   Pulse: 76  73   Temp:    98 F (36.7 C)  TempSrc:    Oral  Resp: 15  10   Height:      Weight:  93.9 kg (207 lb 0.2 oz)    SpO2: 97%  98%    Weight change:   Intake/Output Summary (Last 24 hours) at 10/19/14 1133 Last data filed at 10/19/14 1022  Gross per 24 hour  Intake 1152.33 ml  Output   1400 ml  Net -247.67 ml   General: resting in bed HEENT: EOMI Cardiac: RRR, no rubs, murmurs or gallops Pulm: clear to auscultation bilaterally, moving normal volumes of air Abd: obese, soft, nontender, nondistended, BS present Ext: warm and well perfused, no pedal edema Neuro: alert and oriented X3, cranial nerves II-XII grossly intact  Lab Results: Basic Metabolic Panel:  Recent Labs  10/18/14 0752 10/18/14 2212 10/19/14 0221  NA 133*  --  136  K 4.1  --  3.6  CL 97  --  103  CO2 26  --  24  GLUCOSE 126*  --  100*  BUN 20  --  22  CREATININE 1.78*  --  1.60*  CALCIUM 9.6  --  9.0  MG  --  1.9  --    CBC:  Recent Labs  10/18/14 0752 10/19/14 0221  WBC 11.4* 8.1  NEUTROABS  --  3.6  HGB 13.2 12.0  HCT 39.9 36.0  MCV 84.4 83.7  PLT 314 274   Cardiac Enzymes:  Recent Labs  10/18/14 0752 10/18/14 2212 10/19/14 0221  TROPONINI <0.03 <0.03 <0.03   Fasting Lipid Panel:  Recent Labs  10/18/14 1655  CHOL 218*  HDL 31*  LDLCALC 144*  TRIG 216*  CHOLHDL 7.0   Thyroid Function Tests:  Recent Labs  10/18/14 1655    TSH 2.080   Urinalysis:  Recent Labs  10/18/14 1518  COLORURINE YELLOW  LABSPEC 1.010  PHURINE 5.5  GLUCOSEU NEGATIVE  HGBUR NEGATIVE  BILIRUBINUR NEGATIVE  KETONESUR NEGATIVE  PROTEINUR NEGATIVE  UROBILINOGEN 0.2  NITRITE NEGATIVE  LEUKOCYTESUR SMALL*    Micro Results: Recent Results (from the past 240 hour(s))  MRSA PCR Screening     Status: None   Collection Time: 10/18/14  2:30 PM  Result Value Ref Range Status   MRSA by PCR NEGATIVE NEGATIVE Final    Comment:        The GeneXpert MRSA Assay (FDA approved for NASAL specimens only), is one component of a comprehensive MRSA colonization surveillance program. It is not intended to diagnose MRSA infection nor to guide or monitor treatment for MRSA infections.    Studies/Results: Dg Chest 2 View  10/18/2014   CLINICAL DATA:  Left chest pain  EXAM: CHEST  2 VIEW  COMPARISON:  02/24/2014  FINDINGS: The heart size and mediastinal contours are within normal limits. Both lungs are clear. The visualized skeletal structures are unremarkable.  IMPRESSION:  No active cardiopulmonary disease.   Electronically Signed   By: Franchot Gallo M.D.   On: 10/18/2014 09:31   US Renal  10/18/2014   CLINICAL DATA:  Elevated serum creatinine.  EXAM: RENAL/URINARY TRACT ULTRASOUND COMPLETE  COMPARISON:  CT abdomen and pelvis 09/21/2010  FINDINGS: Right Kidney:  Length: 9.9 cm. Echogenicity within normal limits. No mass or hydronephrosis visualized.  Left Kidney:  Length: 9.6 cm. Echogenicity within normal limits. No mass or hydronephrosis visualized.  Bladder:  Appears normal for degree of bladder distention.  Heterogeneous uterus partially visualized consistent with previously documented fibroids.  IMPRESSION: No hydronephrosis.   Electronically Signed   By: Logan Bores   On: 10/18/2014 19:57   Medications: I have reviewed the patient's current medications. Scheduled Meds: . atorvastatin  40 mg Oral q1800  . buPROPion  300 mg Oral Daily   . clonazePAM  1 mg Oral TID  . DULoxetine  60 mg Oral Daily  . enoxaparin (LOVENOX) injection  40 mg Subcutaneous Q24H  . gabapentin  300 mg Oral TID  . pantoprazole  40 mg Oral Daily  . sulfamethoxazole-trimethoprim  1 tablet Oral BID   Continuous Infusions:  PRN Meds:.acetaminophen **OR** acetaminophen, diphenhydrAMINE, HYDROcodone-acetaminophen, traZODone Assessment/Plan: Principal Problem:   Chest pain Active Problems:   Radiculopathy   Anxiety and depression   Elevated fasting glucose   Hypertension   Abscess of skin of abdomen   AKI (acute kidney injury)   Hyperlipidemia  Chest pain resolved, no MSK tenderness, EKG wnl w/ troponin neg x3: TIMI score of 1 (>3 risk factors for ACS (HTN, DM, family h/o CAD, hypercholesterolemia). TSH wnl. Denies tobacco use. Alternative etiology includes anxiety - patient is same age as her mother when her mother died from MI. Patient did not receive heating pads overnight but reports improved pain. She remains concerned about cardiac etiology despite reassuring EKG and negative troponin. Consider inpatient vs outpatient myoview for further work-up. - continue anti-anxiety meds - IV zofran 4mg  q8hrs prn - cardiology c/s, appreciate recs  Hyperlipidemia: total cholesterol 218, TG elevated 216, HDL low (31) and LDL high 144. 10 year ASCVD risk 17.0%. In setting of DM and 10-year ASCVD>7.5% patient appropriate for high-intensity statin - atorvastatin 40mg  Qday - counsel on diet/excercise for weight loss to reduce risk of cardiovascular ds  Anxiety/depression: Denies any mood symptoms currently.  - continue duloxetine 60mg  Qday - continue clonazepam 1mg  TID - continue bupropion 300mg  Qday  AKI 2/2 drug rxn (bactrim) vs CKD in undiagnosed DM vs prerenal: Creatinine 1.78 on admission, now 1.6. Was wnl on 04/15/2014 (1.03), but high/normal previously (1.22 on 09/21/2010 during admission for PID). Was 0.8-0.9 in 2009. Renal US demonstrated no  hydronephrosis.  - d/c bactrim - f/u urine creatinine for FENa, FE urea assessment - hold home lisinopril - avoid NSAIDs  T2DM: Hemoglobin A1C 6.6 on 10/18/2007 with CBG 186. Denies blurred vision, polyuria or polydipsia.   - f/u repeat A1c  HTN: Patient has been normotensive. Received 1L fluid overnight.  - d/c IVF - hold home lisinopril-HCTZ in setting of AKI and hypotension  Prolonged QT: QTc .459 on EKG on 4/19 (0.450 in 10/2011). Mg wnl. - avoid QT-prolonging medications  Abdominal abscess: Afebrile, resolved leukocytosis, stable VS. Day 5 of 7-day course of sulfamethoxazole-trimethoprim 800-160 BID. Drainage of serosanguinous fluid, nonerythematous, nontender, nonfluctuant. Bactrim may be contributing to AKI.  - d/c PO sulfamethoxazole-trimethoprim 800-160 BID   Nerve compression with associated backpain: She saw Dr. Patrice Paradise ([facility]),  received MRI significant for nerve compression at L4/L5. She also has a history of right-sided LE radiculopathy for which she received a course of prednisone, completed >1 month ago. Has an appointment for surgery (fusion of discs) on 4/27.  - continue fentanyl patch (61mcg q3days) - continue home gabapentin 300mg  TID - continue home hydrocodone-acetaminophen (NORCO) 5-325 Q4hrs prn for moderate pain  Diet: Regular DVT Ppx: Lovenox 40mg  Code: Full  Dispo: Awaiting cardiology recommendations re: inpatient vs outpatient myoview. Anticipated discharge in approximately 1 day(s).   The patient does not have a current PCP (No Pcp Per Patient) and does need an [facility] hospital follow-up appointment after discharge.  The patient does not have transportation limitations that hinder transportation to clinic appointments.  .Services Needed at time of discharge: Y = Yes, Blank = No PT:   OT:   RN:   Equipment:   Other:       Carolan Shiver, Med Student 10/19/2014, 11:33 AM

## 2014-10-19 NOTE — Progress Notes (Signed)
UR completed 

## 2014-10-19 NOTE — Consult Note (Signed)
CARDIOLOGY CONSULT NOTE  Patient ID: Taylor Bowman MRN: [id number] DOB/AGE: 1971/03/31 44 y.o.  Admit date: 10/18/2014 Primary Physician No PCP Per Patient Primary Cardiologist None Chief Complaint  Chest pain  HPI:  The patient has no prior cardiac history.  She presented with chest pain.  He reports she had chest pain 7 years ago treadmill test was negative. Her symptoms actually started on Saturday. This happened at rest. She had a substernal discomfort. It came and went spontaneously. However, she had again on Tuesday. She presented to the emergency room. She describes this as a midsternal squeezing. She felt sweaty when it was at its peak. It was severe. It lasted about 5 minutes. It went away and she's had a dull ache remaining. She had some very mild nausea. She has had some shortness of breath climbing her stairs. However, she's not had any acute shortness of breath. She's not having any PND or orthopnea. She's not having any palpitations, presyncope or syncope. He is active taking care of her house. However, she is somewhat limited by back pain.   Past Medical History  Diagnosis Date  . Hypertension   . Back pain   . Uterine fibroid     Past Surgical History  Procedure Laterality Date  . Back surgery    . Ectopic pregnancy surgery    . Tubal ligation      Allergies  Allergen Reactions  . Bee Venom Anaphylaxis and Hives  . Codeine Hives  . Demerol Hives  . Morphine And Related Hives and Itching   Prescriptions prior to admission  Medication Sig Dispense Refill Last Dose  . buPROPion (WELLBUTRIN XL) 300 MG 24 hr tablet Take 300 mg by mouth daily.   10/17/2014 at Unknown time  . clonazePAM (KLONOPIN) 1 MG tablet Take 1 mg by mouth 3 (three) times daily.   10/17/2014 at Unknown time  . diphenhydrAMINE (BENADRYL) 25 MG tablet Take 25 mg by mouth every 6 (six) hours as needed for itching.   Past Month at Unknown time  . DULoxetine (CYMBALTA) 60 MG capsule Take 60 mg by mouth  daily.   10/17/2014 at Unknown time  . fentaNYL (DURAGESIC - DOSED MCG/HR) 25 MCG/HR patch Place 25 mcg onto the skin every 3 (three) days.   Past Week at Unknown time  . gabapentin (NEURONTIN) 300 MG capsule Take 300 mg by mouth 3 (three) times daily.   Past Week at Unknown time  . lisinopril-hydrochlorothiazide (PRINZIDE,ZESTORETIC) 10-12.5 MG per tablet Take 1 tablet by mouth daily.   10/17/2014 at Unknown time  . oxyCODONE-acetaminophen (PERCOCET) 10-325 MG per tablet Take 1 tablet by mouth every 4 (four) hours as needed for pain.   10/18/2014 at Unknown time  . sulfamethoxazole-trimethoprim (BACTRIM DS,SEPTRA DS) 800-160 MG per tablet Take 1 tablet by mouth 2 (two) times daily. 20 tablet 0 10/17/2014 at Unknown time  . temazepam (RESTORIL) 30 MG capsule Take 30 mg by mouth at bedtime.   10/17/2014 at Unknown time  . tiZANidine (ZANAFLEX) 2 MG tablet Take 2 mg by mouth every 6 (six) hours as needed for muscle spasms.   10/17/2014 at Unknown time  . traZODone (DESYREL) 50 MG tablet Take 50 mg by mouth at bedtime.   10/17/2014 at Unknown time  . doxycycline (VIBRAMYCIN) 100 MG capsule Take 1 capsule (100 mg total) by mouth 2 (two) times daily. One po bid x 7 days 14 capsule 0   . HYDROcodone-acetaminophen (NORCO/VICODIN) 5-325 MG per tablet Take 2 tablets  by mouth every 4 (four) hours as needed. 16 tablet 0 did not fill  . ondansetron (ZOFRAN ODT) 4 MG disintegrating tablet Take 1 tablet (4 mg total) by mouth every 8 (eight) hours as needed for nausea or vomiting. 20 tablet 0   . predniSONE (DELTASONE) 10 MG tablet Take 2 tablets (20 mg total) by mouth daily. 10 tablet 0 Past Week at Unknown time   Family History  Problem Relation Age of Onset  . Diabetes Father   . Heart failure Mother     History   Social History  . Marital Status: Single    Spouse Name: N/A  . Number of Children: N/A  . Years of Education: N/A   Occupational History  . Not on file.   Social History Main Topics  . Smoking  status: Never Smoker   . Smokeless tobacco: Not on file  . Alcohol Use: No  . Drug Use: No  . Sexual Activity: Yes    Birth Control/ Protection: Surgical   Other Topics Concern  . Not on file   Social History Narrative   Patient is a Marine scientist, currently unemployed. Lives at home with 69 y.o. Daughter.     ROS:    As stated in the HPI and negative for all other systems.  Physical Exam: Blood pressure 126/63, pulse 71, temperature 98.8 F (37.1 C), temperature source Oral, resp. rate 14, height 5\' 6"  (1.676 m), weight 207 lb 0.2 oz (93.9 kg), SpO2 97 %.  GENERAL:  Well appearing HEENT:  Pupils equal round and reactive, fundi not visualized, oral mucosa unremarkable NECK:  No jugular venous distention, waveform within normal limits, carotid upstroke brisk and symmetric, no bruits, no thyromegaly LYMPHATICS:  No cervical, inguinal adenopathy LUNGS:  Clear to auscultation bilaterally BACK:  No CVA tenderness CHEST:  Unremarkable HEART:  PMI not displaced or sustained,S1 and S2 within normal limits, no S3, no S4, no clicks, no rubs, no murmurs ABD:  Flat, positive bowel sounds normal in frequency in pitch, no bruits, no rebound, no guarding, no midline pulsatile mass, no hepatomegaly, no splenomegaly EXT:  2 plus pulses throughout, no edema, no cyanosis no clubbing SKIN:  No rashes no nodules NEURO:  Cranial nerves II through XII grossly intact, motor grossly intact throughout PSYCH:  Cognitively intact, oriented to person place and time  Labs: Lab Results  Component Value Date   BUN 22 10/19/2014   Lab Results  Component Value Date   CREATININE 1.60* 10/19/2014   Lab Results  Component Value Date   NA 136 10/19/2014   K 3.6 10/19/2014   CL 103 10/19/2014   CO2 24 10/19/2014   Lab Results  Component Value Date   TROPONINI <0.03 10/19/2014   Lab Results  Component Value Date   WBC 8.1 10/19/2014   HGB 12.0 10/19/2014   HCT 36.0 10/19/2014   MCV 83.7 10/19/2014   PLT  274 10/19/2014   Lab Results  Component Value Date   CHOL 218* 10/18/2014   HDL 31* 10/18/2014   LDLCALC 144* 10/18/2014   TRIG 216* 10/18/2014   CHOLHDL 7.0 10/18/2014   Lab Results  Component Value Date   ALT 11 04/15/2014   AST 38* 04/15/2014   ALKPHOS 42 04/15/2014   BILITOT 0.4 04/15/2014    Radiology:   CXR: The heart size and mediastinal contours are within normal limits. Both lungs are clear. The visualized skeletal structures are unremarkable.  EKG:   NSR, rate 76, nonspecific inferior T  wave inversion, borderline QT prolongation.   ASSESSMENT AND PLAN:   CHEST PAIN:  Her chest pain is atypical. However, she does have a family history of sudden death. She needs to have stress testing. She would not be a walker treadmill. Therefore, she will have a [employer].  She does not want to go home prior to having this and so we will schedule it as an inpatient.  DYSLIPIDEMIA:  I would suggest dietary management.  We did discuss this.  HTN:  Her blood pressure is currently well controlled. She will continue on the meds as listed.  SignedMinus Breeding 10/19/2014, 12:32 PM

## 2014-10-19 NOTE — Progress Notes (Signed)
Report called to [facility] on 3E.

## 2014-10-19 NOTE — Progress Notes (Signed)
Subjective: [name] was seen and examined at bedside this morning. Family present in room as well. She reports that her chest pain has resolved but she remains anxious and concerned of CAD and would like stress testing. She reports last use of doxycycline last week.   Objective: Vital signs in last 24 hours: Filed Vitals:   10/18/14 2340 10/19/14 0314 10/19/14 0317 10/19/14 0325  BP:   103/54   Pulse: 73  76   Temp:  98.4 F (36.9 C)    TempSrc:  Oral    Resp: 13  15   Height:      Weight:    207 lb 0.2 oz (93.9 kg)  SpO2: 97%  97%    Weight change:   Intake/Output Summary (Last 24 hours) at 10/19/14 0716 Last data filed at 10/19/14 0320  Gross per 24 hour  Intake 2152.33 ml  Output    700 ml  Net 1452.33 ml   Vitals reviewed. General: resting in bed, NAD HEENT: EOMI, soft spoken Cardiac: RRR, -tenderness to palpation of chest Pulm: clear to auscultation bilaterally Abd: soft, obese, BS present Ext: moving all extremities Neuro: alert and oriented, responding to questions appropriately and following commands  Lab Results: Basic Metabolic Panel:  Recent Labs Lab 10/18/14 0752 10/18/14 2212 10/19/14 0221  NA 133*  --  136  K 4.1  --  3.6  CL 97  --  103  CO2 26  --  24  GLUCOSE 126*  --  100*  BUN 20  --  22  CREATININE 1.78*  --  1.60*  CALCIUM 9.6  --  9.0  MG  --  1.9  --    CBC:  Recent Labs Lab 10/18/14 0752 10/19/14 0221  WBC 11.4* 8.1  NEUTROABS  --  3.6  HGB 13.2 12.0  HCT 39.9 36.0  MCV 84.4 83.7  PLT 314 274   Cardiac Enzymes:  Recent Labs Lab 10/18/14 0752 10/18/14 2212 10/19/14 0221  TROPONINI <0.03 <0.03 <0.03   Fasting Lipid Panel:  Recent Labs Lab 10/18/14 1655  CHOL 218*  HDL 31*  LDLCALC 144*  TRIG 216*  CHOLHDL 7.0   Thyroid Function Tests:  Recent Labs Lab 10/18/14 1655  TSH 2.080   Urinalysis:  Recent Labs Lab 10/18/14 1518  COLORURINE YELLOW  LABSPEC 1.010  PHURINE 5.5  GLUCOSEU NEGATIVE    HGBUR NEGATIVE  BILIRUBINUR NEGATIVE  KETONESUR NEGATIVE  PROTEINUR NEGATIVE  UROBILINOGEN 0.2  NITRITE NEGATIVE  LEUKOCYTESUR SMALL*   Micro Results: Recent Results (from the past 240 hour(s))  MRSA PCR Screening     Status: None   Collection Time: 10/18/14  2:30 PM  Result Value Ref Range Status   MRSA by PCR NEGATIVE NEGATIVE Final    Comment:        The GeneXpert MRSA Assay (FDA approved for NASAL specimens only), is one component of a comprehensive MRSA colonization surveillance program. It is not intended to diagnose MRSA infection nor to guide or monitor treatment for MRSA infections.    Studies/Results: Dg Chest 2 View  10/18/2014   CLINICAL DATA:  Left chest pain  EXAM: CHEST  2 VIEW  COMPARISON:  02/24/2014  FINDINGS: The heart size and mediastinal contours are within normal limits. Both lungs are clear. The visualized skeletal structures are unremarkable.  IMPRESSION: No active cardiopulmonary disease.   Electronically Signed   By: Franchot Gallo M.D.   On: 10/18/2014 09:31   US Renal  10/18/2014  CLINICAL DATA:  Elevated serum creatinine.  EXAM: RENAL/URINARY TRACT ULTRASOUND COMPLETE  COMPARISON:  CT abdomen and pelvis 09/21/2010  FINDINGS: Right Kidney:  Length: 9.9 cm. Echogenicity within normal limits. No mass or hydronephrosis visualized.  Left Kidney:  Length: 9.6 cm. Echogenicity within normal limits. No mass or hydronephrosis visualized.  Bladder:  Appears normal for degree of bladder distention.  Heterogeneous uterus partially visualized consistent with previously documented fibroids.  IMPRESSION: No hydronephrosis.   Electronically Signed   By: Logan Bores   On: 10/18/2014 19:57   MRI lumbar spine 10/10/14: CLINICAL DATA: 44 year old female with chronic low back pain since lumbar surgery 10 years ago worsening now with right hip and leg pain with numbness and weakness for 4 months. No known injury. Known uterine fibroids. Subsequent  encounter.  EXAM: MRI LUMBAR SPINE WITHOUT AND WITH CONTRAST  TECHNIQUE: Multiplanar and multiecho pulse sequences of the lumbar spine were obtained without and with intravenous contrast.  CONTRAST: 55mL MULTIHANCE GADOBENATE DIMEGLUMINE 529 MG/ML IV SOLN  COMPARISON: 07/28/2014 plain film exam. 04/15/2014 CT. 04/16/2012 lumbar spine MR.  FINDINGS: Last fully open disk space is labeled L5-S1. Present examination incorporates from T11-12 disc space through the lower sacrum/ upper coccyx.  Conus L1-2 level.  Uterine fibroids noted but incompletely assessed. Adenomyosis changes may also be present.  T11-12: Negative.  T12-L1: Mild facet joint degenerative changes.  L1-2: Mild facet joint degenerative changes.  L2-3: Mild facet joint degenerative changes.  L3-4: Moderate facet joint degenerative changes. Mild ligamentum flavum hypertrophy slightly greater on the left. Mild disc degeneration with bulge slightly greater left foraminal/ lateral position without compression of the exiting left L3 nerve root. Tiny left lateral annular fissure. Minimal narrowing lateral aspect of the thecal sac slightly greater on the left without compression of the contained L4 nerve root.  L4-5: Prominent disc degeneration with significant surrounding discogenic changes right aspect (representing change since the prior exam). Significant facet joint degenerative changes with bony overgrowth and ligamentum flavum hypertrophy. 9 mm anterior slippage of L4 has progressed from prior MR when this measured 7.9 mm. Prominent pseudo disc bulge/ protrusion with cephalad extension greater right paracentral position. Extension of disc into the narrowed neural foramen bilaterally greater on the right. Mass effect upon the exiting L4 nerve roots greater on the right. Multifactorial moderate to slightly marked spinal stenosis.  L5-S1: Prior left hemilaminectomy. Postoperative  granulation tissue surrounds the left aspect of the thecal sac and the left S1 nerve root without evidence recurrent disc herniation in this region. Small Schmorl's node deformity inferior endplate L5. Minimal bulge with greatest extension left lateral position with there is an associated bony spur touching but not compressing the exiting left L5 nerve root in a far lateral position. Moderate facet joint degenerative changes greater on the left.  IMPRESSION: Most notable change from the prior MR and most significant findings are at the L4-5 level. Summary of pertinent findings includes:  L4-5 prominent disc degeneration with significant surrounding discogenic changes right aspect. Significant facet joint degenerative changes with bony overgrowth and ligamentum flavum hypertrophy. 9 mm anterior slippage of L4. Prominent pseudo disc bulge/ protrusion with cephalad extension greater right paracentral position. Extension of disc into the narrowed neural foramen bilaterally greater on the right. Mass effect upon the exiting L4 nerve roots greater on the right. Multifactorial moderate to slightly marked spinal stenosis.  L3-4 moderate facet joint degenerative changes. Mild ligamentum flavum hypertrophy. Mild bulge slightly greater left foraminal/ lateral position without compression of the exiting  left L3 nerve root. Tiny left lateral annular fissure. Minimal narrowing lateral aspect of the thecal sac slightly greater on the left without compression of the contained L4 nerve root.  L5-S1 prior left hemilaminectomy. Postoperative granulation tissue surrounds the left aspect of the thecal sac and the left S1 nerve root without evidence recurrent disc herniation in this region. Minimal bulge with greatest extension left lateral position with there is an associated bony spur touching but not compressing the exiting left L5 nerve root in a far lateral position. Moderate facet joint  degenerative changes greater on the left.  Uterine fibroids noted but incompletely assessed. Adenomyosis changes may also be present.   Electronically Signed  By: Genia Del M.D.  On: 10/11/2014 07:03  Medications: I have reviewed the patient's current medications. Scheduled Meds: . atorvastatin  40 mg Oral q1800  . buPROPion  300 mg Oral Daily  . clonazePAM  1 mg Oral TID  . DULoxetine  60 mg Oral Daily  . enoxaparin (LOVENOX) injection  40 mg Subcutaneous Q24H  . gabapentin  300 mg Oral TID  . pantoprazole  40 mg Oral Daily  . sulfamethoxazole-trimethoprim  1 tablet Oral BID   Continuous Infusions:  PRN Meds:.acetaminophen **OR** acetaminophen, diphenhydrAMINE, HYDROcodone-acetaminophen, traZODone Assessment/Plan: Principal Problem:   Chest pain Active Problems:   Radiculopathy   Anxiety and depression   Elevated fasting glucose   Hypertension   Abscess of skin of abdomen   AKI (acute kidney injury)   Hyperlipidemia Chest Pain--resolved. CE have been negative x3. No TTP on exam today.  She has TIMI score of 0 but with significant FH of mother with MI at same age. She is obese, has HTN, and hyperlipidemia.  She is anxious and concerned about possible CAD and requesting stress testing. CP could be secondary to anxiety vs. GERD (recent use of doxycycline that could have caused pill esophagitis). -will transfer to tele if remaining in patient for stress test -continue protonix 40 mg daily  -Continue cymbalta 60 mg daily, klonopin 1 mg TID, and bupropion 300 mg daily for mood disorder -Risk stratification with A1c, lipid panel LDL 144, Cholesterol 218, HDL 31, TG 216, and TSH 2.080 -Heat pad to area prn and continue home norco/vicodin 5-325 mg Q 4 hr PRN (allergy to morphine) -Awaiting word from cardiology if patient can get inpatient stress test -Continue statin  Abdominal abscess s/p I & D - Pt with recent I & D on 4/16 due to right abdominal abscess in setting of  recent spider bite that is well-healing. Pt afebrile with resolved leukocytosis.  -d/c antibiotics -continue to monitor  Hypertension - Currently normotensive. Pt at home on lisinopril-HCTZ 10-12.5 mg daily.  -Hold lisinopril-HCTZ in setting of AKI and volume depletion  Non-oliguric AKI on probable CKD Stage 2 - Cr 1.78 on admission with last Cr 6 months ago 1.03. Improved to 1.6 today. Renal ultrasound with no hydronephrosis, right and left kidneys 9.9 and 9.6cm respectively with normal limites of echogenicity. ?pre-renal vs. Medication related (on bactrim). Holding home prinzide.  -d/c bactrim -urine urea pending, urine Na 25 -Monitor daily weights and strict I/O's -Avoid nephrotoxins -monitor renal function  Anxiety and depression--home medications continued -Continue duloxetine 60 mg daily -Continue clonazepam 1 mg TID -Continue bupropion 300 mg daily  Chronic low back pain with right sided L4/5 radiculopathy - Pt follows with Dr. Patrice Paradise ([facility]). She received MRI on 10/10/14 with most significant findings at L4-L5 level. Please see above report.  -Continue  fentanyl patch (25 mcg q3days) -Continue home gabapentin 300 mg TID -Continue home norco 5-325 Q 4hr PRN -Pt to follow-up for surgical consultation on 4/27 and with PCP.   Probable Type 2 Diabetes Mellitus - Last A1c was 6.6 on 10/18/07. She denies symptoms. Fasting glucose elevated at 126. -Recheck A1c pending  Diet: Regular DVT Ppx: Lovenox  Code: Full  Dispo: Possible d/c today or tomorrow  The patient does not have a current PCP (No Pcp Per Patient) and does need an [facility] hospital follow-up appointment after discharge.  The patient does not have transportation limitations that hinder transportation to clinic appointments.  Services Needed at time of discharge: Y = Yes, Blank = No PT:   OT:   RN:   Equipment:   Other:       Wilber Oliphant, MD 10/19/2014, 7:16 AM

## 2014-10-19 NOTE — Progress Notes (Signed)
Paged [facility] regarding pt observation status needing to be changed to inpt. Awaiting call back.

## 2014-10-20 ENCOUNTER — Observation Stay (HOSPITAL_COMMUNITY): Payer: Medicare Other

## 2014-10-20 DIAGNOSIS — R0789 Other chest pain: Principal | ICD-10-CM

## 2014-10-20 DIAGNOSIS — E785 Hyperlipidemia, unspecified: Secondary | ICD-10-CM

## 2014-10-20 DIAGNOSIS — E669 Obesity, unspecified: Secondary | ICD-10-CM | POA: Diagnosis present

## 2014-10-20 DIAGNOSIS — I10 Essential (primary) hypertension: Secondary | ICD-10-CM

## 2014-10-20 DIAGNOSIS — R079 Chest pain, unspecified: Secondary | ICD-10-CM

## 2014-10-20 LAB — BASIC METABOLIC PANEL
Anion gap: 10 (ref 5–15)
BUN: 15 mg/dL (ref 6–23)
CO2: 23 mmol/L (ref 19–32)
CREATININE: 1.31 mg/dL — AB (ref 0.50–1.10)
Calcium: 9.3 mg/dL (ref 8.4–10.5)
Chloride: 104 mmol/L (ref 96–112)
GFR calc Af Amer: 56 mL/min — ABNORMAL LOW (ref >90)
GFR calc non Af Amer: 49 mL/min — ABNORMAL LOW (ref >90)
GLUCOSE: 86 mg/dL (ref 70–99)
Potassium: 4 mmol/L (ref 3.5–5.1)
SODIUM: 137 mmol/L (ref 135–145)

## 2014-10-20 LAB — HEMOGLOBIN A1C
Hgb A1c MFr Bld: 6 % — ABNORMAL HIGH (ref 4.8–5.6)
MEAN PLASMA GLUCOSE: 126 mg/dL

## 2014-10-20 LAB — CK: CK TOTAL: 98 U/L (ref 7–177)

## 2014-10-20 MED ORDER — REGADENOSON 0.4 MG/5ML IV SOLN
0.4000 mg | Freq: Once | INTRAVENOUS | Status: AC
  Administered 2014-10-20: 0.4 mg via INTRAVENOUS
  Filled 2014-10-20: qty 5

## 2014-10-20 MED ORDER — TECHNETIUM TC 99M SESTAMIBI - CARDIOLITE
30.0000 | Freq: Once | INTRAVENOUS | Status: AC | PRN
  Administered 2014-10-20: 30 via INTRAVENOUS

## 2014-10-20 MED ORDER — HYDROCHLOROTHIAZIDE 12.5 MG PO CAPS
12.5000 mg | ORAL_CAPSULE | Freq: Every day | ORAL | Status: DC
  Administered 2014-10-20: 12.5 mg via ORAL
  Filled 2014-10-20: qty 1

## 2014-10-20 MED ORDER — ATORVASTATIN CALCIUM 40 MG PO TABS
40.0000 mg | ORAL_TABLET | Freq: Every day | ORAL | Status: AC
Start: 2014-10-20 — End: ?

## 2014-10-20 MED ORDER — PANTOPRAZOLE SODIUM 40 MG PO TBEC: 40.0000 mg | DELAYED_RELEASE_TABLET | Freq: Every day | ORAL | Status: DC

## 2014-10-20 MED ORDER — REGADENOSON 0.4 MG/5ML IV SOLN
INTRAVENOUS | Status: AC
  Administered 2014-10-20: 0.4 mg via INTRAVENOUS
  Filled 2014-10-20: qty 5

## 2014-10-20 MED ORDER — TECHNETIUM TC 99M SESTAMIBI GENERIC - CARDIOLITE
10.0000 | Freq: Once | INTRAVENOUS | Status: AC | PRN
  Administered 2014-10-20: 10 via INTRAVENOUS

## 2014-10-20 MED ORDER — LISINOPRIL 10 MG PO TABS
10.0000 mg | ORAL_TABLET | Freq: Every day | ORAL | Status: DC
  Administered 2014-10-20: 10 mg via ORAL
  Filled 2014-10-20: qty 1

## 2014-10-20 NOTE — Progress Notes (Signed)
  RD consulted for nutrition education regarding prediabetes.   Lab Results  Component Value Date   HGBA1C 6.0* 10/18/2014    RD provided "Carbohydrate Counting for People with Diabetes" handout from the Academy of [employer]etics. Discussed different food groups and their effects on blood sugar, emphasizing carbohydrate-containing foods. Provided list of carbohydrates and recommended serving sizes of common foods.  Discussed importance of controlled and consistent carbohydrate intake throughout the day. Provided examples of ways to balance meals/snacks and encouraged intake of high-fiber, whole grain complex carbohydrates. Discussed tips for heart healthy eating. Teach back method used.  Expect good compliance.  Body mass index is 31.6 kg/(m^2). Pt meets criteria for Obesity based on current BMI.  Current diet order is NPO; patient reports eating well and good appetite PTA. Labs and medications reviewed. No further nutrition interventions warranted at this time. RD contact information provided. If additional nutrition issues arise, please re-consult RD.  Pryor Ochoa RD, LDN Inpatient Clinical Dietitian Pager: [phone] After Hours Pager: [phone]

## 2014-10-20 NOTE — Discharge Summary (Signed)
Name: Taylor Bowman MRN: [id number] DOB: 10/26/1970 44 y.o. PCP: No Pcp Per Patient  Date of Admission: 10/18/2014  7:40 AM Date of Discharge: 10/20/2014 Attending Physician: Dr Annia Belt  Discharge Diagnosis:  Atypical Chest Pain  Non-oliguric AKI on probable CKD Stage 2 Hypertension  Hyperlipidemia  Anxiety and depression Abdominal abscess s/p I & D  Pre-diabetes Obesity Chronic low back pain with right sided L4/5 radiculopathy  HIV screening DVT Prophylaxis    Discharge Medications:   Medication List    STOP taking these medications        doxycycline 100 MG capsule  Commonly known as:  VIBRAMYCIN     oxyCODONE-acetaminophen 10-325 MG per tablet  Commonly known as:  PERCOCET     predniSONE 10 MG tablet  Commonly known as:  DELTASONE     sulfamethoxazole-trimethoprim 800-160 MG per tablet  Commonly known as:  BACTRIM DS,SEPTRA DS      TAKE these medications        atorvastatin 40 MG tablet  Commonly known as:  LIPITOR  Take 1 tablet (40 mg total) by mouth daily at 6 PM.     buPROPion 300 MG 24 hr tablet  Commonly known as:  WELLBUTRIN XL  Take 300 mg by mouth daily.     clonazePAM 1 MG tablet  Commonly known as:  KLONOPIN  Take 1 mg by mouth 3 (three) times daily.     diphenhydrAMINE 25 MG tablet  Commonly known as:  BENADRYL  Take 25 mg by mouth every 6 (six) hours as needed for itching.     DULoxetine 60 MG capsule  Commonly known as:  CYMBALTA  Take 60 mg by mouth daily.     fentaNYL 25 MCG/HR patch  Commonly known as:  DURAGESIC - dosed mcg/hr  Place 25 mcg onto the skin every 3 (three) days.     gabapentin 300 MG capsule  Commonly known as:  NEURONTIN  Take 300 mg by mouth 3 (three) times daily.     HYDROcodone-acetaminophen 5-325 MG per tablet  Commonly known as:  NORCO/VICODIN  Take 2 tablets by mouth every 4 (four) hours as needed.     lisinopril-hydrochlorothiazide 10-12.5 MG per tablet  Commonly known as:   PRINZIDE,ZESTORETIC  Take 1 tablet by mouth daily.     ondansetron 4 MG disintegrating tablet  Commonly known as:  ZOFRAN ODT  Take 1 tablet (4 mg total) by mouth every 8 (eight) hours as needed for nausea or vomiting.     pantoprazole 40 MG tablet  Commonly known as:  PROTONIX  Take 1 tablet (40 mg total) by mouth daily.     temazepam 30 MG capsule  Commonly known as:  RESTORIL  Take 30 mg by mouth at bedtime.     tiZANidine 2 MG tablet  Commonly known as:  ZANAFLEX  Take 2 mg by mouth every 6 (six) hours as needed for muscle spasms.     traZODone 50 MG tablet  Commonly known as:  DESYREL  Take 50 mg by mouth at bedtime.        Disposition and follow-up:   Ms.Taylor Bowman was discharged from [facility] in Good condition.  At the hospital follow up visit please address:  1.  Atypical chest pain - thought to be due to GERD vs anxiety vs costochondritis - please assess whether or not to continue trial of protonix (20-month supply given)        Elevated Cr  of 1.31 on discharge - continue to monitor renal function (possible baseline CKD Stage 2). Avoid NSAID's.          Hyperlipidemia - LDL 144 not at goal <100, pt newly started on atorvastatin, ensure compliance, monitor for myalgias, and ensure liver function remains stable         Pre-diabetes and obesity - encourage lifestyle changes   2.  Labs / imaging needed at time of follow-up: CMP (Cr, LFT's)   3.  Pending labs/ test needing follow-up: None  Follow-up Appointments:     Follow-up Information    Call to follow up.   Why:  [phone] to find a doctor that accepts your insurance.      Discharge Instructions: -Start taking lipitor 40 mg daily for high cholesterol  -Start taking protonix 40 mg daily for acid reflux -Stop taking bactrim, your abdominal wound has healed -You have pre-diabetes (your A1c is 6. In diabetes A1c is more than 6.5 ) try to follow a healthy diet and exercise  -Take norco as  needed for pain -Avoid taking NSAID's (ibuprofen, advil, motrin,alieve, naproxen, BC/goody powder) because they are not good for your kidney.  -Please follow-up with a primary care doctor of your choosing in 1-2 weeks  -Your stress was normal and you do not need to follow-up with cardiology -Mrs Taylor Bowman is stable to return to work on Monday 10/24/14.      Consultations: Treatment Team:  Rounding [facility], MD  Procedures Performed:  Dg Chest 2 View  10/18/2014   CLINICAL DATA:  Left chest pain  EXAM: CHEST  2 VIEW  COMPARISON:  02/24/2014  FINDINGS: The heart size and mediastinal contours are within normal limits. Both lungs are clear. The visualized skeletal structures are unremarkable.  IMPRESSION: No active cardiopulmonary disease.   Electronically Signed   By: Franchot Gallo M.D.   On: 10/18/2014 09:31   Mr Lumbar Spine W Wo Contrast  10/11/2014   CLINICAL DATA:  44 year old female with chronic low back pain since lumbar surgery 10 years ago worsening now with right hip and leg pain with numbness and weakness for 4 months. No known injury. Known uterine fibroids. Subsequent encounter.  EXAM: MRI LUMBAR SPINE WITHOUT AND WITH CONTRAST  TECHNIQUE: Multiplanar and multiecho pulse sequences of the lumbar spine were obtained without and with intravenous contrast.  CONTRAST:  50mL MULTIHANCE GADOBENATE DIMEGLUMINE 529 MG/ML IV SOLN  COMPARISON:  07/28/2014 plain film exam. 04/15/2014 CT. 04/16/2012 lumbar spine MR.  FINDINGS: Last fully open disk space is labeled L5-S1. Present examination incorporates from T11-12 disc space through the lower sacrum/ upper coccyx.  Conus L1-2 level.  Uterine fibroids noted but incompletely assessed. Adenomyosis changes may also be present.  T11-12: Negative.  T12-L1:  Mild facet joint degenerative changes.  L1-2: Mild facet joint degenerative changes.  L2-3:  Mild facet joint degenerative changes.  L3-4: Moderate facet joint degenerative changes. Mild ligamentum flavum  hypertrophy slightly greater on the left. Mild disc degeneration with bulge slightly greater left foraminal/ lateral position without compression of the exiting left L3 nerve root. Tiny left lateral annular fissure. Minimal narrowing lateral aspect of the thecal sac slightly greater on the left without compression of the contained L4 nerve root.  L4-5: Prominent disc degeneration with significant surrounding discogenic changes right aspect (representing change since the prior exam). Significant facet joint degenerative changes with bony overgrowth and ligamentum flavum hypertrophy. 9 mm anterior slippage of L4 has progressed from prior MR when this measured 7.9  mm. Prominent pseudo disc bulge/ protrusion with cephalad extension greater right paracentral position. Extension of disc into the narrowed neural foramen bilaterally greater on the right. Mass effect upon the exiting L4 nerve roots greater on the right. Multifactorial moderate to slightly marked spinal stenosis.  L5-S1: Prior left hemilaminectomy. Postoperative granulation tissue surrounds the left aspect of the thecal sac and the left S1 nerve root without evidence recurrent disc herniation in this region. Small Schmorl's node deformity inferior endplate L5. Minimal bulge with greatest extension left lateral position with there is an associated bony spur touching but not compressing the exiting left L5 nerve root in a far lateral position. Moderate facet joint degenerative changes greater on the left.  IMPRESSION: Most notable change from the prior MR and most significant findings are at the L4-5 level. Summary of pertinent findings includes:  L4-5 prominent disc degeneration with significant surrounding discogenic changes right aspect. Significant facet joint degenerative changes with bony overgrowth and ligamentum flavum hypertrophy. 9 mm anterior slippage of L4. Prominent pseudo disc bulge/ protrusion with cephalad extension greater right paracentral  position. Extension of disc into the narrowed neural foramen bilaterally greater on the right. Mass effect upon the exiting L4 nerve roots greater on the right. Multifactorial moderate to slightly marked spinal stenosis.  L3-4 moderate facet joint degenerative changes. Mild ligamentum flavum hypertrophy. Mild bulge slightly greater left foraminal/ lateral position without compression of the exiting left L3 nerve root. Tiny left lateral annular fissure. Minimal narrowing lateral aspect of the thecal sac slightly greater on the left without compression of the contained L4 nerve root.  L5-S1 prior left hemilaminectomy. Postoperative granulation tissue surrounds the left aspect of the thecal sac and the left S1 nerve root without evidence recurrent disc herniation in this region. Minimal bulge with greatest extension left lateral position with there is an associated bony spur touching but not compressing the exiting left L5 nerve root in a far lateral position. Moderate facet joint degenerative changes greater on the left.  Uterine fibroids noted but incompletely assessed. Adenomyosis changes may also be present.   Electronically Signed   By: Genia Del M.D.   On: 10/11/2014 07:03   US Renal  10/18/2014   CLINICAL DATA:  Elevated serum creatinine.  EXAM: RENAL/URINARY TRACT ULTRASOUND COMPLETE  COMPARISON:  CT abdomen and pelvis 09/21/2010  FINDINGS: Right Kidney:  Length: 9.9 cm. Echogenicity within normal limits. No mass or hydronephrosis visualized.  Left Kidney:  Length: 9.6 cm. Echogenicity within normal limits. No mass or hydronephrosis visualized.  Bladder:  Appears normal for degree of bladder distention.  Heterogeneous uterus partially visualized consistent with previously documented fibroids.  IMPRESSION: No hydronephrosis.   Electronically Signed   By: Logan Bores   On: 10/18/2014 19:57   Nm Myocar Multi W/spect W/wall Motion / Ef  10/20/2014   CLINICAL DATA:  Chest pain  EXAM: Lexiscan Myovue   TECHNIQUE: The patient received IV Lexiscan .4mg  over 15 seconds. 33.0 mCi of Technetium 44m Sestamibi injected at 30 seconds. Quantitative SPECT images were obtained in the vertical, horizontal and short axis planes after a 45 minute delay. Rest images were obtained with similar planes and delay using 10.2 mCi of Technetium 81m Sestamibi.  FINDINGS: ECG:  NSR.  No change with Lexiscan infusion.  Quantitiative Gated SPECT EF: >60%. No regional wall motion abnormalities.  Perfusion Images: No significant perfusion defects at rest or with Lexiscan stress.  IMPRESSION: 1.  Normal study with no evidence for ischemia or infarction.  2.  Normal LV systolic function and wall motion.  Dalton Mclean   Electronically Signed   By: Loralie Champagne M.D.   On: 10/20/2014 16:42     Admission HPI: Original Author Juluis Mire, MD  Florita Nitsch is 44 year old woman with past medical history of hypertension, uterine fibroids, and chronic low back pain with right sided radiculopathy who presents with chest pain of three day duration.   She was recently seen in the ED on 4/16 for a right sided abdominal abscess due to a spider bite that was drained. At that time she was also having chest pain and EKG revealed no ischemic changes. Cardiac enzymes were not checked. She reports that the chest pain as "sharp and crushing" left sided/centrally located that would last 5-7 minutes and would improve with rest and deep breathing. Pain would radiate to her left shoulder and forearm and was unrelieved with home percocet. She has not used nitroglycerin for the pain. One day later the chest pain was better but reoccurred this morning with associated dyspnea, diaphoresis, and dizziness. She denies similar chest pain in the past. She denies fever, chills, cough, heavy lifting, chest trauma, wheezing, palpitations, nausea, vomiting, abdominal pain, or syncope. She denies acid reflux symptoms. She denies history of thromboembolism. She reports  being stressed out recently due to her 91 yr old daughter being sick with strep throat. She has history of anxiety and depression and has been compliant with taking her medications. Her mother died from MI at age 68. She was given fentanyl in the ED with improvement of pain to 5/10 from 9/10.   Hospital Course by problem list:   Atypical Chest Pain - Pt presented with 1st time chest pain of three-day duration that was central to left sided in location and was not anginal in nature. Her cycled cardiac enzymes were negative and 12-lead EKG did not reveal ischemic changes. She was monitored on telemetry with no cardiac events during hospitalization. She had TIMI score of 1 (CAD risk factors of HTN, HL, and FH) and significant family history of mother with MI and death at same age. Her chest pain was thought to be atypical per cardiology however she insisted on undergoing stress testing in setting of her mother's past cardiac history. She had nuclear medicine cardiac stress testing (Myoview) on 10/20/14 that revealed no evidence of ischemia or infarction. Her LV function was normal and there were no wall motion abnormalities. Her TSH was normal. Her pregnancy test was negative. She initially had tenderness to palpation at the sternum thought to be due to costochondritis but denied heavy lifting or chest trauma. She was continued on home narcotics for pain control. She has history of anxiety compliant with medications but with recent stressors that may have been contributory. She was continued on her home mood medications. GERD was also thought to be possible and was started on protonix daily which she was instructed to continue on discharge. She will need to be reassessed by her PCP to determine if she needs to be continued on therapy. She had Well's score of zero with no concern for thromboembolism. Her chest xray on admission was normal. Her chest pain resolved during hospitalization and per cardiology (Dr. Marlou Porch)  she did not need further cardiology follow-up.    Non-oliguric AKI on probable CKD Stage 2 - Pt with Cr of 1.78 on admission that improved to 1.31 with IVF's. Pt with last Cr of 1.03 with GFR 75 on 04/16/15. UA did not reveal evidence of  proteinuria or hematuria. Calculated FeNa was 0.3% suggesting pre-renal azotemia. Pt denied family history of kidney disease. Renal US on 10/18/14 was normal. CK level was normal on 10/20/14. Pt had normal urine output during hospitalization. She has probable CKD Stage 2 in setting of hypertension. Pt was instructed to avoid NSAID use. She will have close PCP follow-up to monitor her renal function.   Hypertension - Pt with blood pressure range of 95/54 - 157/98 during hospitalization.  Pt at home on lisinopril-HCTZ 10-12.5 mg daily which was initially held in setting of AKI and volume depletion and then restarted.   Hyperlipidemia - Lipid panel on 10/18/14 with LDL 144 not at goal <100 with total cholesterol 218, TG 216, and HDL 31. Her calculated 10-yr ASCVD risk was 9.6% and she was started on high intensity statin, atorvastatin 40 mg daily. Her baseline CK level was normal on 10/20/14.  Anxiety and depression - Pt with anxiety in setting of recent life stressors that may have been in part contributory to her atypical chest pain on admission. She was continued on home duloxetine 60 mg daily, clonazepam 1 mg TID, and bupropion 300 mg daily.   Abdominal abscess s/p I & D - Pt with recent I & D on 4/16 due to right abdominal abscess in setting of recent spider bite that was well-healing. She received 3 additional doses of home bactrim which she completed total of 5 days of antibiotic therapy.   Pre-diabetes - Pt with glucose range of 86-126 during hospitalization. Her A1c was 6 on 10/18/14 consistent with pre-diabetes. She was seen by registered dietician who counseled her on lifestyle changes.   Obesity - Pt with BMI of 31.8 meeting criteria for obesity. She was counseled  on lifestyle changes.   Chronic low back pain with right sided L4/5 radiculopathy - Pt is s/p L5-S1 left hemilaminectomy.  Pt with chronic low back pain with right sided sciatica that was unchanged from baseline during hospitalization. Recent MRI lumbar spine w/wo cont revealed L4/5 prominent disc degeneration, anterior slippage of L4, prominent pseudo disc bulge/protrusion with mass effect on L4 nerve root greater on the right and multifactorial moderate spinal stenosis. Pt follows with Dr. Patrice Paradise with [facility]. She was continued on home fentanyl patch (25 mcg q 3 days), home gabapentin 300 mg TID, and home norco 5-325 Q 4hr as needed for pain. Pt to follow-up for surgical consultation on 10/26/14.   HIV screening - Pt tested negative for HIV during hospitalization.    DVT Prophylaxis -  Pt received lovenox during hospitalization with no evidence of DVT.     Discharge Vitals:   BP 95/54 mmHg  Pulse 62  Temp(Src) 97.9 F (36.6 C) (Oral)  Resp 18  Ht 5\' 6"  (1.676 m)  Wt 195 lb 11.2 oz (88.769 kg)  BMI 31.60 kg/m2  SpO2 96%  Discharge Labs:  Results for orders placed or performed during the hospital encounter of 10/18/14 (from the past 24 hour(s))  Basic metabolic panel     Status: Abnormal   Collection Time: 10/20/14  5:59 AM  Result Value Ref Range   Sodium 137 135 - 145 mmol/L   Potassium 4.0 3.5 - 5.1 mmol/L   Chloride 104 96 - 112 mmol/L   CO2 23 19 - 32 mmol/L   Glucose, Bld 86 70 - 99 mg/dL   BUN 15 6 - 23 mg/dL   Creatinine, Ser 1.31 (H) 0.50 - 1.10 mg/dL   Calcium 9.3 8.4 -  10.5 mg/dL   GFR calc non Af Amer 49 (L) >90 mL/min   GFR calc Af Amer 56 (L) >90 mL/min   Anion gap 10 5 - 15  CK     Status: None   Collection Time: 10/20/14  5:59 AM  Result Value Ref Range   Total CK 98 7 - 177 U/L    Signed: Juluis Mire, MD 10/20/2014, 9:06 PM    Services Ordered on Discharge: None Equipment Ordered on Discharge: None

## 2014-10-20 NOTE — Progress Notes (Signed)
Subjective: Patient reports repeated episode of chest pain this morning (around the time of elevated BP) after she moved from the bed to the chair prior to her stress test. Again describes the pain as a "heaviness" in her chest. Also reports HA last night for which she received tylenol - reports that she has a HA currently and has had one off and on for the past week. Denies any current CP, belly pain. Endorses some anxiety regarding the outcome of the stress test but denies feeling generally anxious and does not feel that the CP she experienced was 2/2 anxiety or panic.   Objective: Vital signs in last 24 hours: Filed Vitals:   10/19/14 1746 10/19/14 2115 10/20/14 0137 10/20/14 0639  BP:  132/66 123/62 131/67  Pulse:  71 65 68  Temp: 98.4 F (36.9 C) 98.3 F (36.8 C) 97.8 F (36.6 C) 97.7 F (36.5 C)  TempSrc: Oral Oral Oral Oral  Resp:  20 18 18   Height:  5\' 6"  (1.676 m)    Weight:  89.177 kg (196 lb 9.6 oz)  88.769 kg (195 lb 11.2 oz)  SpO2:  95% 97% 97%   Weight change: 12.973 kg (28 lb 9.6 oz)  Intake/Output Summary (Last 24 hours) at 10/20/14 0711 Last data filed at 10/20/14 0137  Gross per 24 hour  Intake    980 ml  Output   1000 ml  Net    -20 ml   General: resting in bed HEENT: EOMI Cardiac: RRR, no rubs, murmurs or gallops Pulm: clear to auscultation bilaterally, moving normal volumes of air Abd: soft, nontender, nondistended, BS present, dry bandage on RUQ from previous I&D Ext: warm and well perfused, no pedal edema Neuro: alert and oriented X3, cranial nerves II-XII grossly intact  Lab Results: Basic Metabolic Panel:  Recent Labs  10/18/14 0752 10/18/14 2212 10/19/14 0221  NA 133*  --  136  K 4.1  --  3.6  CL 97  --  103  CO2 26  --  24  GLUCOSE 126*  --  100*  BUN 20  --  22  CREATININE 1.78*  --  1.60*  CALCIUM 9.6  --  9.0  MG  --  1.9  --    CBC:  Recent Labs  10/18/14 0752 10/19/14 0221  WBC 11.4* 8.1  NEUTROABS  --  3.6  HGB 13.2  12.0  HCT 39.9 36.0  MCV 84.4 83.7  PLT 314 274   Cardiac Enzymes:  Recent Labs  10/18/14 0752 10/18/14 2212 10/19/14 0221  TROPONINI <0.03 <0.03 <0.03   Fasting Lipid Panel:  Recent Labs  10/18/14 1655  CHOL 218*  HDL 31*  LDLCALC 144*  TRIG 216*  CHOLHDL 7.0   Thyroid Function Tests:  Recent Labs  10/18/14 1655  TSH 2.080   Urinalysis:  Recent Labs  10/18/14 1518  COLORURINE YELLOW  LABSPEC 1.010  PHURINE 5.5  GLUCOSEU NEGATIVE  HGBUR NEGATIVE  BILIRUBINUR NEGATIVE  KETONESUR NEGATIVE  PROTEINUR NEGATIVE  UROBILINOGEN 0.2  NITRITE NEGATIVE  LEUKOCYTESUR SMALL*   Micro Results: Recent Results (from the past 240 hour(s))  MRSA PCR Screening     Status: None   Collection Time: 10/18/14  2:30 PM  Result Value Ref Range Status   MRSA by PCR NEGATIVE NEGATIVE Final    Comment:        The GeneXpert MRSA Assay (FDA approved for NASAL specimens only), is one component of a comprehensive MRSA colonization surveillance program. It  is not intended to diagnose MRSA infection nor to guide or monitor treatment for MRSA infections.    Studies/Results: Dg Chest 2 View  10/18/2014   CLINICAL DATA:  Left chest pain  EXAM: CHEST  2 VIEW  COMPARISON:  02/24/2014  FINDINGS: The heart size and mediastinal contours are within normal limits. Both lungs are clear. The visualized skeletal structures are unremarkable.  IMPRESSION: No active cardiopulmonary disease.   Electronically Signed   By: Franchot Gallo M.D.   On: 10/18/2014 09:31   US Renal  10/18/2014   CLINICAL DATA:  Elevated serum creatinine.  EXAM: RENAL/URINARY TRACT ULTRASOUND COMPLETE  COMPARISON:  CT abdomen and pelvis 09/21/2010  FINDINGS: Right Kidney:  Length: 9.9 cm. Echogenicity within normal limits. No mass or hydronephrosis visualized.  Left Kidney:  Length: 9.6 cm. Echogenicity within normal limits. No mass or hydronephrosis visualized.  Bladder:  Appears normal for degree of bladder distention.   Heterogeneous uterus partially visualized consistent with previously documented fibroids.  IMPRESSION: No hydronephrosis.   Electronically Signed   By: Logan Bores   On: 10/18/2014 19:57   Medications: I have reviewed the patient's current medications. Scheduled Meds: . atorvastatin  40 mg Oral q1800  . buPROPion  300 mg Oral Daily  . clonazePAM  1 mg Oral TID  . DULoxetine  60 mg Oral Daily  . enoxaparin (LOVENOX) injection  40 mg Subcutaneous Q24H  . gabapentin  300 mg Oral TID  . pantoprazole  40 mg Oral Daily   Continuous Infusions:  PRN Meds:.acetaminophen **OR** acetaminophen, diphenhydrAMINE, HYDROcodone-acetaminophen, ondansetron (ZOFRAN) IV, traZODone Assessment/Plan: Principal Problem:   Chest pain Active Problems:   Radiculopathy   Anxiety and depression   Elevated fasting glucose   Hypertension   Abscess of skin of abdomen   AKI (acute kidney injury)   Hyperlipidemia   Atypical chest pain   Elevated serum creatinine  Atypical chest pain resolved, family h/o sudden cardiac death, EKG wnl w/ troponin neg x3: Has not complained of recurrent chest pain; TIMI score of 1 (>3 risk factors for ACS (HTN, DM, family h/o CAD, hypercholesterolemia). Myoview stress test to r/o cardiac etiology. Other explanations include anxiety vs costochondritis vs GERD. - cardiology consulted, appreciate recs - Myoview today - continue anti-anxiety meds - IV zofran 4mg  q8hrs prn - protonix 40mg  - cardiology c/s, appreciate recs  Hyperlipidemia: total cholesterol 218, TG elevated 216, HDL low (31) and LDL high 144. 10 year ASCVD risk 17.0%. In setting of DM and 10-year ASCVD>7.5% patient appropriate for high-intensity statin - cont atorvastatin 40mg  Qday - counsel on diet/excercise for weight loss to reduce risk of cardiovascular ds  Anxiety/depression: Denies any mood symptoms currently.  - continue duloxetine 60mg  Qday - continue clonazepam 1mg  TID - continue bupropion 300mg   Qday  AKI, likely prerenal - resolving: Cr 1.3 from 1.6. FENa 0.3%, likely prerenal. No recent history of contrast; no history of diuretics. Likely 2/2 hypovolemia. Renal US demonstrated no hydronephrosis. Bactrim discontinued on 4/20. - avoid NSAIDs  Prediabetes: Hemoglobin A1C 6.6 on 10/18/2007, recheck 4/19 6.0. Denies blurred vision, polyuria or polydipsia. Nutrition saw her today.  - diet/lifestyle counseling  HTN: Hypertensive 150s/80s in AM. Occurred in setting of CP.  - restart home lisinopril-HCTZ   Prolonged QT: QTc .459 on EKG on 4/19 (0.450 in 10/2011). Mg wnl. - avoid QT-prolonging medications  Abdominal abscess: Afebrile, resolved leukocytosis, stable VS. Completed 5 of 7 days of bactrim. Bactrim was d/c 2/2 AKI.   Nerve compression with associated  backpain: She saw Dr. Patrice Paradise ([facility]), received MRI significant for nerve compression at L4/L5. She also has a history of right-sided LE radiculopathy for which she received a course of prednisone, completed >1 month ago. Has an appointment for surgery (fusion of discs) on 4/27.  - continue fentanyl patch (57mcg q3days) - continue home gabapentin 300mg  TID - continue home hydrocodone-acetaminophen (NORCO) 5-325 Q4hrs prn for moderate pain  Diet: Regular DVT Ppx: Lovenox 40mg  Code: Full  Dispo: Awaiting cardiology recommendations re: inpatient vs outpatient myoview. Anticipated discharge today following Myoview stress test.  The patient does not have a current PCP (No Pcp Per Patient) and does need an [facility] hospital follow-up appointment after discharge.  The patient does not have transportation limitations that hinder transportation to clinic appointments.  .Services Needed at time of discharge: Y = Yes, Blank = No PT:   OT:   RN:   Equipment:   Other:     LOS: 1 day   Carolan Shiver, Med Student 10/20/2014, 7:11 AM

## 2014-10-20 NOTE — Progress Notes (Signed)
    Await result NUC. Hopeful home today if low risk   Candee Furbish, MD

## 2014-10-20 NOTE — Progress Notes (Signed)
In stress lab. VS stable. Chest tightness 2/10 currently, no SOB, color pink and skin warm and dry. Stress test explained (Lexiscan).

## 2014-10-20 NOTE — Progress Notes (Signed)
Subjective:  She had headache overnight which she received tylenol for. She continues to have intermittent chest pain that she describes as "heaviness."  Pt to have Myoview scan today and is somewhat anxious regarding the results.  Objective: Vital signs in last 24 hours: Filed Vitals:   10/19/14 1746 10/19/14 2115 10/20/14 0137 10/20/14 0639  BP:  132/66 123/62 131/67  Pulse:  71 65 68  Temp: 98.4 F (36.9 C) 98.3 F (36.8 C) 97.8 F (36.6 C) 97.7 F (36.5 C)  TempSrc: Oral Oral Oral Oral  Resp:  20 18 18   Height:  5\' 6"  (1.676 m)    Weight:  196 lb 9.6 oz (89.177 kg)  195 lb 11.2 oz (88.769 kg)  SpO2:  95% 97% 97%   Weight change: 28 lb 9.6 oz (12.973 kg)  Intake/Output Summary (Last 24 hours) at 10/20/14 0759 Last data filed at 10/20/14 0137  Gross per 24 hour  Intake    980 ml  Output   1000 ml  Net    -20 ml   PHYSICAL EXAMINATION:  General: NAD, obese Heart: Normal rate and rhythm with no murmurs or rub.  Lungs: Clear to ausculation bilaterally with no rales, ronchi, or wheezing. Abdomen: Bandage on RUQ wound. Soft, non-tender, non-distended with normal BS.  Extremities:  No edema.  Neuro: A & O x 3  Lab Results: Basic Metabolic Panel:  Recent Labs Lab 10/18/14 0752 10/18/14 2212 10/19/14 0221  NA 133*  --  136  K 4.1  --  3.6  CL 97  --  103  CO2 26  --  24  GLUCOSE 126*  --  100*  BUN 20  --  22  CREATININE 1.78*  --  1.60*  CALCIUM 9.6  --  9.0  MG  --  1.9  --    CBC:  Recent Labs Lab 10/18/14 0752 10/19/14 0221  WBC 11.4* 8.1  NEUTROABS  --  3.6  HGB 13.2 12.0  HCT 39.9 36.0  MCV 84.4 83.7  PLT 314 274   Cardiac Enzymes:  Recent Labs Lab 10/18/14 0752 10/18/14 2212 10/19/14 0221  TROPONINI <0.03 <0.03 <0.03   Hemoglobin A1C:  Recent Labs Lab 10/18/14 2212  HGBA1C 6.0*   Fasting Lipid Panel:  Recent Labs Lab 10/18/14 1655  CHOL 218*  HDL 31*  LDLCALC 144*  TRIG 216*  CHOLHDL 7.0   Thyroid Function  Tests:  Recent Labs Lab 10/18/14 1655  TSH 2.080   Urinalysis:  Recent Labs Lab 10/18/14 1518  COLORURINE YELLOW  LABSPEC 1.010  PHURINE 5.5  GLUCOSEU NEGATIVE  HGBUR NEGATIVE  BILIRUBINUR NEGATIVE  KETONESUR NEGATIVE  PROTEINUR NEGATIVE  UROBILINOGEN 0.2  NITRITE NEGATIVE  LEUKOCYTESUR SMALL*     Micro Results: Recent Results (from the past 240 hour(s))  MRSA PCR Screening     Status: None   Collection Time: 10/18/14  2:30 PM  Result Value Ref Range Status   MRSA by PCR NEGATIVE NEGATIVE Final    Comment:        The GeneXpert MRSA Assay (FDA approved for NASAL specimens only), is one component of a comprehensive MRSA colonization surveillance program. It is not intended to diagnose MRSA infection nor to guide or monitor treatment for MRSA infections.    Studies/Results: Dg Chest 2 View  10/18/2014   CLINICAL DATA:  Left chest pain  EXAM: CHEST  2 VIEW  COMPARISON:  02/24/2014  FINDINGS: The heart size and mediastinal contours are within normal  limits. Both lungs are clear. The visualized skeletal structures are unremarkable.  IMPRESSION: No active cardiopulmonary disease.   Electronically Signed   By: Franchot Gallo M.D.   On: 10/18/2014 09:31   US Renal  10/18/2014   CLINICAL DATA:  Elevated serum creatinine.  EXAM: RENAL/URINARY TRACT ULTRASOUND COMPLETE  COMPARISON:  CT abdomen and pelvis 09/21/2010  FINDINGS: Right Kidney:  Length: 9.9 cm. Echogenicity within normal limits. No mass or hydronephrosis visualized.  Left Kidney:  Length: 9.6 cm. Echogenicity within normal limits. No mass or hydronephrosis visualized.  Bladder:  Appears normal for degree of bladder distention.  Heterogeneous uterus partially visualized consistent with previously documented fibroids.  IMPRESSION: No hydronephrosis.   Electronically Signed   By: Logan Bores   On: 10/18/2014 19:57   Medications: I have reviewed the patient's current medications. Scheduled Meds: . atorvastatin  40 mg  Oral q1800  . buPROPion  300 mg Oral Daily  . clonazePAM  1 mg Oral TID  . DULoxetine  60 mg Oral Daily  . enoxaparin (LOVENOX) injection  40 mg Subcutaneous Q24H  . gabapentin  300 mg Oral TID  . pantoprazole  40 mg Oral Daily   Continuous Infusions:  PRN Meds:.acetaminophen **OR** acetaminophen, diphenhydrAMINE, HYDROcodone-acetaminophen, ondansetron (ZOFRAN) IV, traZODone Assessment/Plan:  Atypical Chest Pain - No current chest pain. Pt with no events on telemetry. Pt with TIMI score of 1 (CAD risk factors of HTN, HL, FH of MI in mother at age 24) with 5% 78 day all-cause mortality, new or recurrent MI, or severe recurrent ischemia requiring urgent revascularization. -Appreciate cardiology recommendations  -Pt to have Myoview cardiac stress testing today, awaiting results, if low risk will discharge home -Continue trial of protonix 40 mg dailyfor GERD  -Continue cymbalta 60 mg daily, klonopin 1 mg TID, and bupropion 300 mg daily for anxiety -Continue atorvastatin 40 mg daily for hyperlipidemia  -Restart home lisinopril-HCTZ 10-12.5 mg daily for hypertension -Continue home norco/vicodin 5-325 mg Q 4 hr PRN pain (allergy to morphine)  Non-oliguric AKI on probable CKD Stage 2 - Cr improved to 1.31 from 1.6 yesterday and 1.78 on admission with IVF's. Calculated FeNa 0.3% consistent with pre-renal azotemia. Pt with last Cr on six months ago 1.03 with GFR 75. Pt denies FH of kidney disease, but with history of hypertension and prediabetes. Renal US on 10/18/14 was normal. CK level normal on 10/20/14.  -Restart home lisinopril-HCTZ 10-12.5 mg daily -Monitor daily weights and strict I/O's -Avoid nephrotoxins  Hypertension - Currently normotensive, was hypertensive overnight. Pt at home on lisinopril-HCTZ 10-12.5 mg daily.  -Restart home lisinopril-HCTZ 10-12.5 mg daily.   Hyperlipidemia - Lipid panel on 10/18/14 with LDL 144 not at goal <100.  -Continue atorvastatin 40 mg daily  Anxiety  and depression - Pt with anxiety in setting of stress testing.   -Continue duloxetine 60 mg daily, clonazepam 1 mg TID, and bupropion 300 mg daily  Pre-diabetes - Glucose 86 this AM. A1c 6 on 10/18/14.  -Obtain dietician consult -Counsel on lifestyle changes  Obesity - Pt with BMI 31.8. -Encourage weight loss  Abdominal abscess s/p I & D - Pt with recent I & D on 4/16 due to right abdominal abscess in setting of recent spider bite that is well-healing and is s/p 5 days of bactrim.  Chronic low back pain with right sided L4/5 radiculopathy - Pt follows with Dr. Patrice Paradise ([facility]). She received MRI on 10/10/14 that was significant for nerve compression at L4/L5.  -Continue  fentanyl patch (25 mcg q3days), home gabapentin 300 mg TID, and  home norco 5-325 Q 4hr PRN -Pt to follow-up for surgical consultation on 4/27   HIV screening - Pt negative for HIV.   Diet: Regular DVT Ppx: Lovenox  Code: Full     Dispo: Disposition is deferred at this time, awaiting improvement of current medical problems.  Anticipated discharge is today pending stress test results.    The patient does not have a current PCP (No Pcp Per Patient) and does need an [facility] hospital follow-up appointment after discharge.  The patient does not have transportation limitations that hinder transportation to clinic appointments.  .Services Needed at time of discharge: Y = Yes, Blank = No PT:   OT:   RN:   Equipment:   Other:     LOS: 1 day   Taylor Mire, MD 10/20/2014, 7:59 AM

## 2014-10-20 NOTE — Progress Notes (Signed)
Patient has not complained of any chest pain since being transferred. Currently patient is resting at this time. Will continue to monitor patient to end of shift.

## 2014-10-20 NOTE — Progress Notes (Signed)
Reports are not crossing over to Epic but Dr. Aundra Dubin reports that this patient's stress test is normal. No ischemia, normal EF. I asked the nurse to please make patient and primary team aware. Dayna Dunn PA-C

## 2014-10-20 NOTE — Discharge Instructions (Signed)
-  Start taking lipitor 40 mg daily for high cholesterol  -Start taking protonix 40 mg daily for acid reflux -Stop taking bactrim, your abdominal wound has healed -You have pre-diabetes (your A1c is 6. In diabetes A1c is more than 6.5 ) try to follow a healthy diet and exercise  -Take norco as needed for pain -Avoid taking NSAID's (ibuprofen, advil, motrin,alieve, naproxen, BC/goody powder) because they are not good for your kidney.  -Please follow-up with a primary care doctor of your choosing in 1-2 weeks  -Your stress was normal and you do not need to follow-up with cardiology -Taylor Bowman is stable to return to work on Monday 10/24/14.

## 2014-10-20 NOTE — Progress Notes (Signed)
    Subjective:  Chest pain in Nuc med but EKG normal and Troponin negative. Will proceed with Myoview this am.  Objective:  Vital Signs in the last 24 hours: Temp:  [97.7 F (36.5 C)-98.8 F (37.1 C)] 97.7 F (36.5 C) (04/21 0639) Pulse Rate:  [65-83] 68 (04/21 0639) Resp:  [14-22] 18 (04/21 0830) BP: (123-151)/(62-98) 140/76 mmHg (04/21 0935) SpO2:  [95 %-98 %] 98 % (04/21 0830) Weight:  [195 lb 11.2 oz (88.769 kg)-196 lb 9.6 oz (89.177 kg)] 195 lb 11.2 oz (88.769 kg) (04/21 0639)  Intake/Output from previous day:  Intake/Output Summary (Last 24 hours) at 10/20/14 1016 Last data filed at 10/20/14 0935  Gross per 24 hour  Intake    980 ml  Output   1000 ml  Net    -20 ml    Physical Exam: General appearance: alert, cooperative, no distress and mildly obese Neck: no JVD Lungs: clear to auscultation bilaterally Heart: regular rate and rhythm Abdomen: soft, non-tender; bowel sounds normal; no masses,  no organomegaly Extremities: no edema   Rate: 80  Rhythm: normal sinus rhythm  Lab Results:  Recent Labs  10/18/14 0752 10/19/14 0221  WBC 11.4* 8.1  HGB 13.2 12.0  PLT 314 274    Recent Labs  10/19/14 0221 10/20/14 0559  NA 136 137  K 3.6 4.0  CL 103 104  CO2 24 23  GLUCOSE 100* 86  BUN 22 15  CREATININE 1.60* 1.31*    Recent Labs  10/18/14 2212 10/19/14 0221  TROPONINI <0.03 <0.03   No results for input(s): INR in the last 72 hours.   Imaging: Imaging results have been reviewed  Cardiac Studies:  Assessment/Plan:   Principal Problem:   Chest pain Active Problems:   Radiculopathy   Anxiety and depression   Prediabetes   Hypertension   Abscess of skin of abdomen   AKI (acute kidney injury)   Hyperlipidemia   Atypical chest pain   Elevated serum creatinine   PLAN: Myoview this am.  Kerin Ransom PA-C Beeper [phone] 10/20/2014, 10:16 AM   Personally seen and examined, agree with above. Regular rate and rhythm, lungs are clear.  Discussed with her that we are awaiting results of stress test. If low risk, discharge.  Candee Furbish, MD

## 2014-10-20 NOTE — Progress Notes (Signed)
At 0830 complaining of 7/10 chest tightness radiating to left shoulder. Color pink, skin warm and dry. VS stable and telemetry called and states sinus rhythm at 70. Notified her nurse Tiffany of chest pain which is resolving 4/10. Spoke to PA L Kilroy at [phone] re chest pain 3/10 now and VS. He would like to move forward with stress test as troponins negative and EKG's have been normal.

## 2014-10-21 NOTE — Progress Notes (Addendum)
Patient ID: Taylor Bowman, female   DOB: 22-Apr-1971, 44 y.o.   MRN: [id number] Medicine attending discharge note: I personally examined this patient on the day of discharge and I tested the accuracy of evaluation and discharge plan as recorded by resident physician Dr. Juluis Mire.  Clinical summary: Obese hypertensive,  44 year old woman with additional coronary risk factors of prediabetes and borderline hyperlipidemia and a strong family history of early coronary disease with her mother dying of a heart attack at age 45. The patient presented on the day of the current admission 10/18/2014 with left sided chest pressure radiating to her shoulder and arm occurring at rest. No pleuritic component.  On initial exam by Dr. Naaman Plummer: Anxious African-American woman Blood pressure 116/72, pulse 62, temperature 98.2 F (36.8 C), temperature source Oral, resp. rate 11, height 5\' 6"  (1.676 m), weight 196 lb 10.4 oz (89.2 kg), SpO2 97 %. Regular cardiac rhythm no murmur gallop or rub. Initial tenderness to palpation midsternal area. No JVD. Clear lungs. No peripheral edema.  Hospital course: Serial troponins and electrocardiograms showed no evidence for acute ischemic damage. The patient continued to have some intermittent atypical chest pressure. In view of her multiple risk factors and her concern over what happened to her mother, we felt it was reasonable to pursue a stress Myoview evaluation to exclude ischemia. This was done on April 21 and supervised by Dr. Loralie Champagne, cardiology. The study was normal with no evidence for ischemia or infarction. Normal left ventricular systolic function and wall motion. We considered the possibility that some of her symptoms were related to reflux esophagitis and started her on a proton pump inhibitor which she will continue at time of discharge.  Additional issues addressed during this admission included renal dysfunction. Creatinine 1.8 on admission compared with  1.0 recorded in October 2015. She was given gentle hydration. Creatinine fell to 1.3 by discharge. BUN 15. Kidneys appeared normal on renal ultrasound done April 19. No obstruction. Normal echogenicity and size.  She is discharged in stable condition. There were no complications.  Main diagnosis for this admission: Atypical, nonischemic, chest pain.

## 2015-07-05 ENCOUNTER — Other Ambulatory Visit: Payer: Self-pay | Admitting: Physical Medicine and Rehabilitation

## 2015-07-05 DIAGNOSIS — M47817 Spondylosis without myelopathy or radiculopathy, lumbosacral region: Secondary | ICD-10-CM

## 2015-07-12 ENCOUNTER — Other Ambulatory Visit: Payer: Medicare Other

## 2015-07-14 ENCOUNTER — Ambulatory Visit
Admission: RE | Admit: 2015-07-14 | Discharge: 2015-07-14 | Disposition: A | Discharge status: Home / Self Care | Payer: Medicare Other | Source: Ambulatory Visit | Attending: Physical Medicine and Rehabilitation | Admitting: Physical Medicine and Rehabilitation

## 2015-07-14 DIAGNOSIS — M47817 Spondylosis without myelopathy or radiculopathy, lumbosacral region: Secondary | ICD-10-CM

## 2015-07-14 MED ORDER — GADOBENATE DIMEGLUMINE 529 MG/ML IV SOLN
15.0000 mL | Freq: Once | INTRAVENOUS | Status: AC | PRN
  Administered 2015-07-14: 15 mL via INTRAVENOUS

## 2015-08-02 ENCOUNTER — Other Ambulatory Visit: Payer: Self-pay | Admitting: Obstetrics & Gynecology

## 2015-08-02 DIAGNOSIS — R928 Other abnormal and inconclusive findings on diagnostic imaging of breast: Secondary | ICD-10-CM

## 2015-08-08 ENCOUNTER — Ambulatory Visit
Admission: RE | Admit: 2015-08-08 | Discharge: 2015-08-08 | Disposition: A | Discharge status: Home / Self Care | Payer: Medicare Other | Source: Ambulatory Visit | Attending: Obstetrics & Gynecology | Admitting: Obstetrics & Gynecology

## 2015-08-08 DIAGNOSIS — R928 Other abnormal and inconclusive findings on diagnostic imaging of breast: Secondary | ICD-10-CM

## 2016-06-29 ENCOUNTER — Emergency Department (HOSPITAL_COMMUNITY): Payer: Medicare Other

## 2016-06-29 ENCOUNTER — Emergency Department (HOSPITAL_COMMUNITY)
Admission: EM | Admit: 2016-06-29 | Discharge: 2016-06-29 | Disposition: A | Discharge status: Home / Self Care | Payer: Medicare Other | Attending: Physician Assistant | Admitting: Physician Assistant

## 2016-06-29 ENCOUNTER — Encounter (HOSPITAL_COMMUNITY): Payer: Self-pay | Admitting: Emergency Medicine

## 2016-06-29 DIAGNOSIS — W208XXA Other cause of strike by thrown, projected or falling object, initial encounter: Secondary | ICD-10-CM | POA: Insufficient documentation

## 2016-06-29 DIAGNOSIS — Z79899 Other long term (current) drug therapy: Secondary | ICD-10-CM | POA: Insufficient documentation

## 2016-06-29 DIAGNOSIS — S9032XA Contusion of left foot, initial encounter: Secondary | ICD-10-CM

## 2016-06-29 DIAGNOSIS — Y999 Unspecified external cause status: Secondary | ICD-10-CM | POA: Insufficient documentation

## 2016-06-29 DIAGNOSIS — I10 Essential (primary) hypertension: Secondary | ICD-10-CM | POA: Insufficient documentation

## 2016-06-29 DIAGNOSIS — Y92009 Unspecified place in unspecified non-institutional (private) residence as the place of occurrence of the external cause: Secondary | ICD-10-CM | POA: Insufficient documentation

## 2016-06-29 DIAGNOSIS — Y939 Activity, unspecified: Secondary | ICD-10-CM | POA: Insufficient documentation

## 2016-06-29 MED ORDER — DICLOFENAC SODIUM 50 MG PO TBEC: 50.0000 mg | DELAYED_RELEASE_TABLET | Freq: Two times a day (BID) | ORAL | 0 refills | Status: DC

## 2016-06-29 MED ORDER — OXYCODONE-ACETAMINOPHEN 5-325 MG PO TABS
ORAL_TABLET | ORAL | Status: AC
  Filled 2016-06-29: qty 1

## 2016-06-29 MED ORDER — OXYCODONE-ACETAMINOPHEN 5-325 MG PO TABS
1.0000 | ORAL_TABLET | ORAL | Status: DC | PRN
  Administered 2016-06-29: 1 via ORAL

## 2016-06-29 NOTE — ED Provider Notes (Signed)
[facility] DEPT Provider Note   CSN: [id number] Arrival date & time: 06/29/16  1536   By signing my name below, I, Delton Prairie, attest that this documentation has been prepared under the direction and in the presence of  Debroah Baller, NP. Electronically Signed: Delton Prairie, ED Scribe. 06/29/16. 5:38 PM.   History   Chief Complaint Chief Complaint  Patient presents with  . Foot Injury   The history is provided by the patient. No language interpreter was used.  Foot Injury   The incident occurred 1 to 2 hours ago. The incident occurred at home. The injury mechanism was a direct blow (crockpot fell on foot). The pain is present in the left foot. The pain is moderate. The pain has been constant since onset. Pertinent negatives include no numbness and no loss of sensation. She reports no foreign bodies present. The symptoms are aggravated by palpation. Treatments tried: percocet. The treatment provided no relief.   HPI Comments:  Taylor Bowman is a 45 y.o. female who presents to the Emergency Department complaining of sudden onset, moderate left foot pain s/p an injury which occurred PTA. Pt states she dropped a crock pot on her foot. Her pain is worse to the touch. She has had percocet in the ED with no relief. Pt denies any other associated symptoms and any other modifying factors at this time.   Past Medical History:  Diagnosis Date  . Anxiety   . Back pain   . Hypertension   . Uterine fibroid     Patient Active Problem List   Diagnosis Date Noted  . Obesity 10/20/2014  . Atypical chest pain   . Elevated serum creatinine   . Radiculopathy 10/18/2014  . Anxiety and depression 10/18/2014  . Prediabetes 10/18/2014  . Hypertension 10/18/2014  . Abscess of skin of abdomen 10/18/2014  . AKI (acute kidney injury) ([place]) 10/18/2014  . Hyperlipidemia 10/18/2014    Past Surgical History:  Procedure Laterality Date  . BACK SURGERY    . ECTOPIC PREGNANCY SURGERY    . TUBAL  LIGATION      OB History    No data available       Home Medications    Prior to Admission medications   Medication Sig Start Date End Date Taking? Authorizing Provider  atorvastatin (LIPITOR) 40 MG tablet Take 1 tablet (40 mg total) by mouth daily at 6 PM. 10/20/14   Juluis Mire, MD  buPROPion (WELLBUTRIN XL) 300 MG 24 hr tablet Take 300 mg by mouth daily.    Historical Provider, MD  clonazePAM (KLONOPIN) 1 MG tablet Take 1 mg by mouth 3 (three) times daily.    Historical Provider, MD  diclofenac (VOLTAREN) 50 MG EC tablet Take 1 tablet (50 mg total) by mouth 2 (two) times daily. 06/29/16   Hope Bunnie Pion, NP  diphenhydrAMINE (BENADRYL) 25 MG tablet Take 25 mg by mouth every 6 (six) hours as needed for itching.    Historical Provider, MD  DULoxetine (CYMBALTA) 60 MG capsule Take 60 mg by mouth daily.    Historical Provider, MD  fentaNYL (DURAGESIC - DOSED MCG/HR) 25 MCG/HR patch Place 25 mcg onto the skin every 3 (three) days.    Historical Provider, MD  gabapentin (NEURONTIN) 300 MG capsule Take 300 mg by mouth 3 (three) times daily.    Historical Provider, MD  HYDROcodone-acetaminophen (NORCO/VICODIN) 5-325 MG per tablet Take 2 tablets by mouth every 4 (four) hours as needed. 10/15/14   Fransico Meadow, PA-C  lisinopril-hydrochlorothiazide (PRINZIDE,ZESTORETIC) 10-12.5 MG per tablet Take 1 tablet by mouth daily.    Historical Provider, MD  ondansetron (ZOFRAN ODT) 4 MG disintegrating tablet Take 1 tablet (4 mg total) by mouth every 8 (eight) hours as needed for nausea or vomiting. 04/18/14   Jennifer Piepenbrink, PA-C  pantoprazole (PROTONIX) 40 MG tablet Take 1 tablet (40 mg total) by mouth daily. 10/20/14   Juluis Mire, MD  temazepam (RESTORIL) 30 MG capsule Take 30 mg by mouth at bedtime. 02/10/14   Historical Provider, MD  tiZANidine (ZANAFLEX) 2 MG tablet Take 2 mg by mouth every 6 (six) hours as needed for muscle spasms.    Historical Provider, MD  traZODone (DESYREL) 50 MG tablet  Take 50 mg by mouth at bedtime.    Historical Provider, MD    Family History Family History  Problem Relation Age of Onset  . Sudden death Mother 96    Died of MI of sudden death  . Diabetes Father     Social History Social History  Substance Use Topics  . Smoking status: Never Smoker  . Smokeless tobacco: Never Used  . Alcohol use No     Allergies   Bee venom; Codeine; Demerol; and Morphine and related   Review of Systems Review of Systems  Musculoskeletal: Positive for arthralgias, joint swelling and myalgias.  Neurological: Negative for numbness.   Physical Exam Updated Vital Signs BP 107/78 (BP Location: Right Arm)   Pulse 64   Temp 98.2 F (36.8 C) (Oral)   Resp 21   Ht 5\' 5"  (1.651 m)   Wt 76.2 kg   SpO2 99%   BMI 27.96 kg/m   Physical Exam  Constitutional: She is oriented to person, place, and time. She appears well-developed and well-nourished. No distress.  HENT:  Head: Normocephalic and atraumatic.  Eyes: Conjunctivae are normal.  Neck: Neck supple.  Cardiovascular: Normal rate.   Pulmonary/Chest: Effort normal.  Abdominal: She exhibits no distension.  Musculoskeletal: Normal range of motion. She exhibits tenderness.  Tenderness and swelling to the dorsum of the left mid foot. Pulses are 2+. Adequate circulation. No tenderness to the ankle. FROM of ankle.   Neurological: She is alert and oriented to person, place, and time.  Skin: Skin is warm and dry.  Psychiatric: She has a normal mood and affect. Her behavior is normal.  Nursing note and vitals reviewed.    ED Treatments / Results  DIAGNOSTIC STUDIES:  Oxygen Saturation is 100% on RA, normal by my interpretation.    COORDINATION OF CARE:  5:36 PM Discussed treatment plan with pt at bedside and pt agreed to plan.  Labs (all labs ordered are listed, but only abnormal results are displayed) Labs Reviewed - No data to display  Radiology Dg Foot Complete Left  Result Date:  06/29/2016 CLINICAL DATA:  Left foot pain across the metatarsals after dropping a crop pop on her foot. EXAM: LEFT FOOT - COMPLETE 3+ VIEW COMPARISON:  None FINDINGS: There is no evidence of fracture or dislocation. Soft tissue swelling over the dorsum of the midfoot. There is no evidence of arthropathy or other focal bone abnormality. Soft tissues are unremarkable. IMPRESSION: Soft tissue swelling of the midfoot without acute osseous abnormality or malalignment. Electronically Signed   By: Ashley Royalty M.D.   On: 06/29/2016 17:28    Procedures Procedures (including critical care time)  Medications Ordered in ED Medications - No data to display   Initial Impression / Assessment and Plan / ED Course  I have reviewed the triage vital signs and the nursing notes.  Pertinent imaging results that were available during my care of the patient were reviewed by me and considered in my medical decision making (see chart for details).  Clinical Course     Patient X-Ray negative for obvious fracture or dislocation.  Pt advised to follow up with orthopedics. Patient given compression wrap while in ED, conservative therapy recommended and discussed. Patient will be discharged home & is agreeable with above plan. Returns precautions discussed. Pt appears safe for discharge.   Final Clinical Impressions(s) / ED Diagnoses   Final diagnoses:  Contusion of left foot, initial encounter    New Prescriptions Discharge Medication List as of 06/29/2016  5:38 PM    START taking these medications   Details  diclofenac (VOLTAREN) 50 MG EC tablet Take 1 tablet (50 mg total) by mouth 2 (two) times daily., Starting Sat 06/29/2016, Print      I personally performed the services described in this documentation, which was scribed in my presence. The recorded information has been reviewed and is accurate.     [facility] Bunnie Pion, NP 06/30/16 2012    Courteney Julio Alm, MD 06/30/16 [phone]

## 2016-06-29 NOTE — ED Triage Notes (Signed)
Dropped crock pot on left foot-- top of foot swollen--

## 2016-06-29 NOTE — ED Notes (Signed)
Wheeled pt in w/c from sub-waiting to TR06, waiting for provider.

## 2016-06-29 NOTE — ED Notes (Addendum)
Wrapped ace wrap on pts ankle and foot per Neese(NP) and gave pt ice pack

## 2016-10-04 ENCOUNTER — Other Ambulatory Visit: Payer: Self-pay | Admitting: Orthopaedic Surgery

## 2016-10-04 DIAGNOSIS — M4716 Other spondylosis with myelopathy, lumbar region: Secondary | ICD-10-CM

## 2016-10-13 ENCOUNTER — Ambulatory Visit
Admission: RE | Admit: 2016-10-13 | Discharge: 2016-10-13 | Disposition: A | Discharge status: Home / Self Care | Payer: Medicare Other | Source: Ambulatory Visit | Attending: Orthopaedic Surgery | Admitting: Orthopaedic Surgery

## 2016-10-13 DIAGNOSIS — M4716 Other spondylosis with myelopathy, lumbar region: Secondary | ICD-10-CM

## 2017-06-10 ENCOUNTER — Other Ambulatory Visit: Payer: Self-pay | Admitting: Orthopaedic Surgery

## 2017-06-10 DIAGNOSIS — M7551 Bursitis of right shoulder: Secondary | ICD-10-CM

## 2017-06-26 ENCOUNTER — Ambulatory Visit
Admission: RE | Admit: 2017-06-26 | Discharge: 2017-06-26 | Disposition: A | Discharge status: Home / Self Care | Payer: Medicare Other | Source: Ambulatory Visit | Attending: Orthopaedic Surgery | Admitting: Orthopaedic Surgery

## 2017-06-26 DIAGNOSIS — M7551 Bursitis of right shoulder: Secondary | ICD-10-CM

## 2018-02-07 ENCOUNTER — Other Ambulatory Visit: Payer: Self-pay | Admitting: Orthopaedic Surgery

## 2018-02-07 DIAGNOSIS — M5106 Intervertebral disc disorders with myelopathy, lumbar region: Secondary | ICD-10-CM

## 2018-02-15 ENCOUNTER — Emergency Department (HOSPITAL_COMMUNITY): Payer: Medicare Other

## 2018-02-15 ENCOUNTER — Emergency Department (HOSPITAL_COMMUNITY)
Admission: EM | Admit: 2018-02-15 | Discharge: 2018-02-15 | Disposition: A | Discharge status: Home / Self Care | Payer: Medicare Other | Attending: Emergency Medicine | Admitting: Emergency Medicine

## 2018-02-15 DIAGNOSIS — H00025 Hordeolum internum left lower eyelid: Secondary | ICD-10-CM | POA: Diagnosis not present

## 2018-02-15 DIAGNOSIS — R51 Headache: Secondary | ICD-10-CM | POA: Diagnosis present

## 2018-02-15 DIAGNOSIS — R519 Headache, unspecified: Secondary | ICD-10-CM

## 2018-02-15 DIAGNOSIS — I10 Essential (primary) hypertension: Secondary | ICD-10-CM | POA: Diagnosis not present

## 2018-02-15 DIAGNOSIS — Z79899 Other long term (current) drug therapy: Secondary | ICD-10-CM | POA: Diagnosis not present

## 2018-02-15 DIAGNOSIS — N189 Chronic kidney disease, unspecified: Secondary | ICD-10-CM

## 2018-02-15 LAB — I-STAT BETA HCG BLOOD, ED (MC, WL, AP ONLY)

## 2018-02-15 LAB — CBC WITH DIFFERENTIAL/PLATELET
ABS IMMATURE GRANULOCYTES: 0 10*3/uL (ref 0.0–0.1)
BASOS PCT: 1 %
Basophils Absolute: 0.1 10*3/uL (ref 0.0–0.1)
Eosinophils Absolute: 0.3 10*3/uL (ref 0.0–0.7)
Eosinophils Relative: 3 %
HCT: 43.1 % (ref 36.0–46.0)
Hemoglobin: 13.4 g/dL (ref 12.0–15.0)
IMMATURE GRANULOCYTES: 0 %
LYMPHS ABS: 3.1 10*3/uL (ref 0.7–4.0)
Lymphocytes Relative: 33 %
MCH: 27.2 pg (ref 26.0–34.0)
MCHC: 31.1 g/dL (ref 30.0–36.0)
MCV: 87.4 fL (ref 78.0–100.0)
MONOS PCT: 6 %
Monocytes Absolute: 0.6 10*3/uL (ref 0.1–1.0)
NEUTROS ABS: 5.2 10*3/uL (ref 1.7–7.7)
NEUTROS PCT: 57 %
PLATELETS: 260 10*3/uL (ref 150–400)
RBC: 4.93 MIL/uL (ref 3.87–5.11)
RDW: 13 % (ref 11.5–15.5)
WBC: 9.2 10*3/uL (ref 4.0–10.5)

## 2018-02-15 LAB — C-REACTIVE PROTEIN

## 2018-02-15 LAB — BASIC METABOLIC PANEL
ANION GAP: 9 (ref 5–15)
BUN: <5 mg/dL — ABNORMAL LOW (ref 6–20)
CALCIUM: 9.8 mg/dL (ref 8.9–10.3)
CHLORIDE: 103 mmol/L (ref 98–111)
CO2: 30 mmol/L (ref 22–32)
CREATININE: 1.23 mg/dL — AB (ref 0.44–1.00)
GFR calc non Af Amer: 51 mL/min — ABNORMAL LOW (ref >60)
GFR, EST AFRICAN AMERICAN: 59 mL/min — AB (ref >60)
Glucose, Bld: 101 mg/dL — ABNORMAL HIGH (ref 70–99)
Potassium: 3.8 mmol/L (ref 3.5–5.1)
SODIUM: 142 mmol/L (ref 135–145)

## 2018-02-15 LAB — SEDIMENTATION RATE: SED RATE: 35 mm/h — AB (ref 0–22)

## 2018-02-15 MED ORDER — TETRACAINE HCL 0.5 % OP SOLN
1.0000 [drp] | Freq: Once | OPHTHALMIC | Status: AC
  Administered 2018-02-15: 1 [drp] via OPHTHALMIC
  Filled 2018-02-15: qty 4

## 2018-02-15 MED ORDER — AMOXICILLIN 875 MG PO TABS: 875.0000 mg | ORAL_TABLET | Freq: Two times a day (BID) | ORAL | 0 refills | Status: DC

## 2018-02-15 MED ORDER — ACETAMINOPHEN 500 MG PO TABS
1000.0000 mg | ORAL_TABLET | Freq: Once | ORAL | Status: AC
  Administered 2018-02-15: 1000 mg via ORAL
  Filled 2018-02-15: qty 2

## 2018-02-15 MED ORDER — SULFAMETHOXAZOLE-TRIMETHOPRIM 800-160 MG PO TABS: 1.0000 | ORAL_TABLET | Freq: Two times a day (BID) | ORAL | 0 refills | Status: DC

## 2018-02-15 MED ORDER — METOCLOPRAMIDE HCL 5 MG/ML IJ SOLN
10.0000 mg | Freq: Once | INTRAMUSCULAR | Status: AC
Start: 2018-02-15 — End: 2018-02-15
  Administered 2018-02-15: 10 mg via INTRAVENOUS
  Filled 2018-02-15: qty 2

## 2018-02-15 MED ORDER — SULFAMETHOXAZOLE-TRIMETHOPRIM 800-160 MG PO TABS: 1.0000 | ORAL_TABLET | Freq: Two times a day (BID) | ORAL | 0 refills | Status: AC

## 2018-02-15 MED ORDER — DIAZEPAM 5 MG PO TABS
5.0000 mg | ORAL_TABLET | Freq: Once | ORAL | Status: AC
  Administered 2018-02-15: 5 mg via ORAL
  Filled 2018-02-15: qty 1

## 2018-02-15 MED ORDER — GADOBENATE DIMEGLUMINE 529 MG/ML IV SOLN
15.0000 mL | Freq: Once | INTRAVENOUS | Status: AC
  Administered 2018-02-15: 15 mL via INTRAVENOUS

## 2018-02-15 MED ORDER — DIPHENHYDRAMINE HCL 50 MG/ML IJ SOLN
25.0000 mg | Freq: Once | INTRAMUSCULAR | Status: AC
  Administered 2018-02-15: 25 mg via INTRAVENOUS
  Filled 2018-02-15: qty 1

## 2018-02-15 MED ORDER — SODIUM CHLORIDE 0.9 % IV BOLUS
1000.0000 mL | Freq: Once | INTRAVENOUS | Status: AC
  Administered 2018-02-15: 1000 mL via INTRAVENOUS

## 2018-02-15 MED ORDER — AMOXICILLIN 875 MG PO TABS: 875.0000 mg | ORAL_TABLET | Freq: Two times a day (BID) | ORAL | 0 refills | Status: AC

## 2018-02-15 MED ORDER — ERYTHROMYCIN 5 MG/GM OP OINT: TOPICAL_OINTMENT | OPHTHALMIC | 0 refills | Status: DC

## 2018-02-15 MED ORDER — FLUORESCEIN SODIUM 1 MG OP STRP
1.0000 | ORAL_STRIP | Freq: Once | OPHTHALMIC | Status: AC
  Administered 2018-02-15: 1 via OPHTHALMIC
  Filled 2018-02-15: qty 1

## 2018-02-15 NOTE — ED Provider Notes (Signed)
[facility] Provider Note   CSN: [id number] Arrival date & time: 02/15/18  3875     History   Chief Complaint Chief Complaint  Patient presents with  . Headache    HPI Taylor Bowman is a 47 y.o. female.  HPI   Patient is a 47 year old female with a history of low back pain, hypertension, and anxiety presenting for left-sided headache and periorbital swelling and irritation.  Patient reports her symptoms began yesterday.  Patient reports that the ophthalmic symptoms as well as the headache began at the same time.  Patient denies getting headaches for many years, reports that this is very typical for her.  Patient reports that the headache is throbbing, retro-orbital, and along the left side of her head, extending down her posterior neck.  Patient reports she has been nauseous without vomiting since its onset.  Patient denies any fevers, chills, neck rigidity.  Patient denies any rashes.  Patient denies any history of hypercoagulability or autoimmune disease.  Patient does report that she feels that her left side both arm and leg could be "feel a little weaker".  Patient also feels that she has a decreased sensation of her left pinky finger as well as the medial instep on her left lower extremity.  Patient reports that it feels different than her chronic symptoms from her low back pain.  Past Medical History:  Diagnosis Date  . Anxiety   . Back pain   . Hypertension   . Uterine fibroid     Patient Active Problem List   Diagnosis Date Noted  . Obesity 10/20/2014  . Atypical chest pain   . Elevated serum creatinine   . Radiculopathy 10/18/2014  . Anxiety and depression 10/18/2014  . Prediabetes 10/18/2014  . Hypertension 10/18/2014  . Abscess of skin of abdomen 10/18/2014  . AKI (acute kidney injury) ([place]) 10/18/2014  . Hyperlipidemia 10/18/2014    Past Surgical History:  Procedure Laterality Date  . BACK SURGERY    . ECTOPIC PREGNANCY  SURGERY    . TUBAL LIGATION       OB History   None      Home Medications    Prior to Admission medications   Medication Sig Start Date End Date Taking? Authorizing Provider  atorvastatin (LIPITOR) 40 MG tablet Take 1 tablet (40 mg total) by mouth daily at 6 PM. 10/20/14  Yes Rabbani, Marjan, MD  diphenhydrAMINE (BENADRYL) 25 MG tablet Take 25 mg by mouth every 6 (six) hours as needed for itching.   Yes [provider]  fentaNYL (DURAGESIC - DOSED MCG/HR) 25 MCG/HR patch Place 25 mcg onto the skin every 3 (three) days.   Yes [provider]  gabapentin (NEURONTIN) 300 MG capsule Take 300 mg by mouth 3 (three) times daily.   Yes [provider]  lidocaine-prilocaine (EMLA) cream Apply 2-3 g topically 4 (four) times daily. To affected area. 02/02/18  Yes [provider]  lisinopril-hydrochlorothiazide (PRINZIDE,ZESTORETIC) 10-12.5 MG per tablet Take 1 tablet by mouth daily.   Yes [provider]  Multiple Vitamin (MULTIVITAMIN) tablet Take 1 tablet by mouth daily.   Yes [provider]  oxyCODONE-acetaminophen (PERCOCET) 10-325 MG tablet Take 1 tablet by mouth every 6 (six) hours as needed for pain.  02/07/18  Yes [provider]  tiZANidine (ZANAFLEX) 2 MG tablet Take 2 mg by mouth every 6 (six) hours as needed for muscle spasms.   Yes [provider]  zolpidem (AMBIEN) 10 MG  tablet Take 10 mg by mouth at bedtime.   Yes [provider]  diclofenac (VOLTAREN) 50 MG EC tablet Take 1 tablet (50 mg total) by mouth 2 (two) times daily. Patient not taking: Reported on 02/15/2018 06/29/16   Ashley Murrain, NP  HYDROcodone-acetaminophen (NORCO/VICODIN) 5-325 MG per tablet Take 2 tablets by mouth every 4 (four) hours as needed. Patient not taking: Reported on 02/15/2018 10/15/14   Fransico Meadow, PA-C  ondansetron (ZOFRAN ODT) 4 MG disintegrating tablet Take 1 tablet (4 mg total) by mouth every 8 (eight) hours as needed for  nausea or vomiting. Patient not taking: Reported on 02/15/2018 04/18/14   Piepenbrink, Anderson Malta, PA-C  pantoprazole (PROTONIX) 40 MG tablet Take 1 tablet (40 mg total) by mouth daily. Patient not taking: Reported on 02/15/2018 10/20/14   Juluis Mire, MD    Family History Family History  Problem Relation Age of Onset  . Sudden death Mother 38       Died of MI of sudden death  . Diabetes Father     Social History Social History   Tobacco Use  . Smoking status: Never Smoker  . Smokeless tobacco: Never Used  Substance Use Topics  . Alcohol use: No  . Drug use: No     Allergies   Bee venom; Codeine; Demerol; and Morphine and related   Review of Systems Review of Systems  Constitutional: Negative for chills and fever.  HENT: Negative for congestion, rhinorrhea, sinus pain and sore throat.   Eyes: Positive for itching and visual disturbance.  Respiratory: Negative for cough, chest tightness and shortness of breath.   Cardiovascular: Negative for chest pain and leg swelling.  Gastrointestinal: Positive for nausea. Negative for abdominal pain and vomiting.  Genitourinary: Negative for dysuria and flank pain.  Musculoskeletal: Positive for back pain and myalgias.  Skin: Negative for rash.  Neurological: Positive for weakness, numbness and headaches. Negative for dizziness, syncope and light-headedness.     Physical Exam Updated Vital Signs BP 123/74 (BP Location: Right Arm)   Pulse 61   Temp 98.7 F (37.1 C) (Oral)   Resp 16   Ht 5\' 10"  (1.778 m)   Wt 78 kg   LMP 02/15/2018 (Approximate)   SpO2 100%   BMI 24.68 kg/m   Physical Exam  Constitutional: She appears well-developed and well-nourished. No distress.  HENT:  Head: Normocephalic and atraumatic.  Mouth/Throat: Oropharynx is clear and moist.  Eyes: Pupils are equal, round, and reactive to light. Conjunctivae and EOM are normal.  Left Eye Exam: Patient has left periorbital edema without erythema.  Is mostly  in the supraorbital region. No scleral injection but patient does have conjunctival chemosis. Diffuse scleral injection. Conjunctival erythema w/o chemosis. No foreign bodies identified on lid eversion, but patient does have pustule of lower lid, not actively draining. PERRL. EOMI and no pain with extraocular movements.  Tono-Pen pressures are 16 in right eye and 15 in the left eye.  Fluorescein stain demonstrates no evidence of corneal abrasion or dendritic lesions.   Neck: Normal range of motion. Neck supple. No Brudzinski's sign noted.  Cardiovascular: Normal rate, regular rhythm, S1 normal, S2 normal and intact distal pulses.  No murmur heard. Pulmonary/Chest: Effort normal and breath sounds normal. She has no wheezes. She has no rales.  Abdominal: Soft. She exhibits no distension. There is no tenderness. There is no guarding.  Musculoskeletal: Normal range of motion. She exhibits no edema or deformity.  Lymphadenopathy:    She has no  cervical adenopathy.  Neurological: She is alert. GCS eye subscore is 4. GCS verbal subscore is 5. GCS motor subscore is 6.  Mental Status:  Alert, oriented, thought content appropriate, able to give a coherent history. Speech fluent without evidence of aphasia. Able to follow 2 step commands without difficulty.  Cranial Nerves:  II:  Peripheral visual fields grossly normal, pupils equal, round, reactive to light III,IV, VI: ptosis not present, extra-ocular motions intact bilaterally  V,VII: smile symmetric, facial light touch sensation equal VIII: hearing grossly normal to voice  X: uvula elevates symmetrically  XI: bilateral shoulder shrug symmetric and strong XII: midline tongue extension without fassiculations Motor:  Normal tone. 5/5 in upper extremities bilaterally including strong and equal grip strength Strength 4/5 in left lower extremity including dorsiflexion/plantar flexion. 5/5 in RLE. Sensory: Patient reporting decreased sensation in medial  instep of left foot.  Deep Tendon Reflexes: 2+ and symmetric in the biceps and patella. Cerebellar: normal finger-to-nose with bilateral upper extremities Gait: normal gait and balance Stance: No pronator drift and good coordination, strength, and position sense with tapping of bilateral arms (performed in sitting position). CV: distal pulses palpable throughout   Skin: Skin is warm and dry. No rash noted. No erythema.  Psychiatric: She has a normal mood and affect. Her behavior is normal. Judgment and thought content normal.  Nursing note and vitals reviewed.    ED Treatments / Results  Labs (all labs ordered are listed, but only abnormal results are displayed) Labs Reviewed  BASIC METABOLIC PANEL - Abnormal; Notable for the following components:      Result Value   Glucose, Bld 101 (*)    BUN <5 (*)    Creatinine, Ser 1.23 (*)    GFR calc non Af Amer 51 (*)    GFR calc Af Amer 59 (*)    All other components within normal limits  SEDIMENTATION RATE - Abnormal; Notable for the following components:   Sed Rate 35 (*)    All other components within normal limits  CBC WITH DIFFERENTIAL/PLATELET  C-REACTIVE PROTEIN  I-STAT BETA HCG BLOOD, ED ([facility], [facility], [facility] ONLY)    EKG None  Radiology No results found.  Procedures Procedures (including critical care time)  Medications Ordered in ED Medications  fluorescein ophthalmic strip 1 strip (1 strip Left Eye Given 02/15/18 1309)  tetracaine (PONTOCAINE) 0.5 % ophthalmic solution 1 drop (1 drop Left Eye Given 02/15/18 1427)  metoCLOPramide (REGLAN) injection 10 mg (10 mg Intravenous Given 02/15/18 1308)  sodium chloride 0.9 % bolus 1,000 mL (1,000 mLs Intravenous New Bag/Given 02/15/18 1356)  diphenhydrAMINE (BENADRYL) injection 25 mg (25 mg Intravenous Given 02/15/18 1308)  acetaminophen (TYLENOL) tablet 1,000 mg (1,000 mg Oral Given 02/15/18 1308)  diazepam (VALIUM) tablet 5 mg (5 mg Oral Given 02/15/18 1426)     Initial Impression /  Assessment and Plan / ED Course  I have reviewed the triage vital signs and the nursing notes.  Pertinent labs & imaging results that were available during my care of the patient were reviewed by me and considered in my medical decision making (see chart for details).  Clinical Course as of Feb 16 1600  Sun Feb 15, 4545  615 47 year old female complaining of some left thigh burning pain and some swelling around that area.  She also has a headache.  On eye exam her anterior chambers clear and her pupils round and react to light.  When you pull down her lower lid she has a pustule  and a lot of conjunctival injection.  She is going to get a [employer] and better eye exam.   [MB]  [place] with Dr. Rory Percy of neurology who recommends MRI with and without of brain.I appreciate his involvement in the care of this patient.   [AM]  1523 Not impressive for temporal arteritis.  Sed Rate(!): 35 [AM]  1523 At baseline, to slightly improved from baseline.  Creatinine(!): 1.23 [AM]  6789 Reassessed patient.  Patient reports headache is now 3 out of 10 and she is having here abnormal sensations in her left side.  Discussed plan with patient and that she will be able to go home with neurology follow-up if she has a normal MRI.  I will also prescribe erythromycin ointment.   [AM]    Clinical Course User Index [AM] Albesa Seen, PA-C [MB] Hayden Rasmussen, MD    Patient nontoxic-appearing, and appears to have a stye within the lower lid likely causing her eye symptoms, but not explanatory for the severe headache and weakness.  Will consult with neurology.  Differential diagnosis includes, acute migraine, demyelinating lesion such multiple sclerosis with optic neuritis, tension type headache.  Doubt subarachnoid hemorrhage, as patient had sudden onset thunderclap nature to the headache, and it is very focal.  Patient has no history of hypercoagulability to make her high risk for venous sinus thrombosis.   No recent trauma.  There is no nuchal rigidity.  Doubt meningitis.  Work-up significant for marginally increased sedimentation rate at 35.  Patient's creatinine is stable and improved from her baseline.  No other abnormal labs.  MRI with and without contrast is pending.  Ambulatory referral to neurology placed.  Patient confirmed that she is able to get a ride home given that she is given Valium for anxiolysis during MRI.   Patient also noted to have developing hordeolum of the left eye.  Does not appear to be related to the extent of patient's headache.  We will treat with erythromycin ointment and close follow-up with PCP.   Care signed out to Wyn Quaker, PA-C at 4:03 PM.   Final Clinical Impressions(s) / ED Diagnoses   Final diagnoses:  Left-sided headache  Hordeolum internum of left lower eyelid  Chronic kidney disease, unspecified CKD stage    ED Discharge Orders         Ordered    Ambulatory referral to Neurology    Comments:  An appointment is requested in approximately: 2 weeks. New onset headache, ED workup pending. Neurologic features.   02/15/18 [place], [place], PA-C 02/15/18 1604    Hayden Rasmussen, MD 02/15/18 1745

## 2018-02-15 NOTE — ED Notes (Signed)
Pt in MRI.

## 2018-02-15 NOTE — ED Notes (Signed)
IV attempted x2

## 2018-02-15 NOTE — Discharge Instructions (Addendum)
Please see the information and instructions below regarding your visit.  Your diagnoses today include:  1. Left-sided headache   2. Hordeolum internum of left lower eyelid   3. Chronic kidney disease, unspecified CKD stage     You were seen and treated in the emergency department today for headache. Fortunately, your vitals, exam, and work-up is reassuring with no apparent emergent cause for your headache at this time.  Tests performed today include: See side panel of your discharge paperwork for testing performed today. Vital signs are listed at the bottom of these instructions.   Test on your eye showed that you have normal pressures in your eye, as well as no scratches on the surface of the eye.  Medications prescribed:    Try to avoid daily or regular use of tylenol, aspirin, ibuprofen, and other overt-the-counter pain medications as this can contribute to rebound headaches.   Take any prescribed medications only as prescribed, and any over the counter medications only as directed on the packaging.  Please apply the erythromycin ointment and a 1 inch ribbon twice daily on the lower lid for 7 days.  Please take all of your antibiotics until finished.   He will take Bactrim every 12 hours for 7 days.  You will take amoxicillin every 12 hours for 7 days.  You may develop abdominal discomfort or nausea from the antibiotic. If this occurs, you may take it with food. Some patients also get diarrhea with antibiotics. You may help offset this with probiotics which you can buy or get in yogurt. Do not eat or take the probiotics until 2 hours after your antibiotic. Some women develop vaginal yeast infections after antibiotics. If you develop unusual vaginal discharge after being on this medication, please see your primary care provider.   Some people develop allergies to antibiotics. Symptoms of antibiotic allergy can be mild and include a flat rash and itching. They can also be more serious and  include:  ?Hives - Hives are raised, red patches of skin that are usually very itchy.  ?Lip or tongue swelling  ?Trouble swallowing or breathing  ?Blistering of the skin or mouth.  If you have any of these serious symptoms, please seek emergency medical care immediately.   Home care instructions:   Drink plenty of fluids at home. This will help with your headache. Be cautious with caffeine use, as this can cause your headache to rebound when the effects wear off. If you drink more than 2 cups of coffee/caffeinated tea, or caffeinated soda per day, I suggest you wean down that amount.  Please follow any educational materials contained in this packet.   Follow-up instructions: Please follow-up with your primary care provider in 5-7 days for further evaluation of your symptoms if they are not completely improved.   Please follow up with [facility] regarding new onset headaches.  They should be reaching out to you to make an appointment.  Return instructions:  Please return to the Emergency Department if you experience worsening symptoms. It is VERY important that you monitor your symptoms at home. If you develop worsening headache, new fever, new neck stiffness, rash, focal weakness or numbness, or any other new or concerning symptoms, please return to the ED immediately, as these may be signs that your headache has become a potentially serious and life-threatening condition.  Please return if you have any other emergent concerns.  Additional Information:   Your vital signs today were: BP 118/69    Pulse Marland Kitchen)  57    Temp 98.7 F (37.1 C) (Oral)    Resp 16    Ht 5\' 10"  (1.778 m)    Wt 78 kg    LMP 02/15/2018 (Approximate)    SpO2 99%    BMI 24.68 kg/m  If your blood pressure (BP) was elevated on multiple readings during this visit above 130 for the top number or above 80 for the bottom number, please have this repeated by your primary care provider within one  month. --------------  Thank you for allowing [place] to participate in your care today.

## 2018-02-15 NOTE — ED Triage Notes (Addendum)
Patient to ED c/o L eye swelling (under eye) since yesterday along with headache on that side extending down her neck with N/V this morning and photophobia. Took Tylenol with some relief.

## 2018-02-24 ENCOUNTER — Ambulatory Visit
Admission: RE | Admit: 2018-02-24 | Discharge: 2018-02-24 | Disposition: A | Discharge status: Home / Self Care | Payer: Medicare Other | Source: Ambulatory Visit | Attending: Orthopaedic Surgery | Admitting: Orthopaedic Surgery

## 2018-02-24 DIAGNOSIS — M5106 Intervertebral disc disorders with myelopathy, lumbar region: Secondary | ICD-10-CM

## 2018-02-24 MED ORDER — GADOBENATE DIMEGLUMINE 529 MG/ML IV SOLN
15.0000 mL | Freq: Once | INTRAVENOUS | Status: AC | PRN
  Administered 2018-02-24: 15 mL via INTRAVENOUS

## 2018-03-31 ENCOUNTER — Inpatient Hospital Stay (HOSPITAL_COMMUNITY): Payer: Medicare Other

## 2018-03-31 ENCOUNTER — Inpatient Hospital Stay (HOSPITAL_COMMUNITY)
Admission: EM | Admit: 2018-03-31 | Discharge: 2018-04-02 | DRG: 948 | Disposition: A | Discharge status: Home / Self Care | Payer: Medicare Other | Attending: General Surgery | Admitting: General Surgery

## 2018-03-31 ENCOUNTER — Other Ambulatory Visit: Payer: Self-pay

## 2018-03-31 ENCOUNTER — Encounter (HOSPITAL_COMMUNITY): Payer: Self-pay | Admitting: Emergency Medicine

## 2018-03-31 ENCOUNTER — Emergency Department (HOSPITAL_COMMUNITY): Payer: Medicare Other

## 2018-03-31 DIAGNOSIS — M48061 Spinal stenosis, lumbar region without neurogenic claudication: Secondary | ICD-10-CM | POA: Diagnosis present

## 2018-03-31 DIAGNOSIS — Y92410 Unspecified street and highway as the place of occurrence of the external cause: Secondary | ICD-10-CM

## 2018-03-31 DIAGNOSIS — G8911 Acute pain due to trauma: Principal | ICD-10-CM | POA: Diagnosis present

## 2018-03-31 DIAGNOSIS — R079 Chest pain, unspecified: Secondary | ICD-10-CM | POA: Diagnosis not present

## 2018-03-31 DIAGNOSIS — Z8249 Family history of ischemic heart disease and other diseases of the circulatory system: Secondary | ICD-10-CM | POA: Diagnosis not present

## 2018-03-31 DIAGNOSIS — S32599A Other specified fracture of unspecified pubis, initial encounter for closed fracture: Secondary | ICD-10-CM

## 2018-03-31 DIAGNOSIS — Z885 Allergy status to narcotic agent status: Secondary | ICD-10-CM | POA: Diagnosis not present

## 2018-03-31 DIAGNOSIS — R0789 Other chest pain: Secondary | ICD-10-CM | POA: Diagnosis present

## 2018-03-31 DIAGNOSIS — M545 Low back pain: Secondary | ICD-10-CM | POA: Diagnosis present

## 2018-03-31 DIAGNOSIS — M5136 Other intervertebral disc degeneration, lumbar region: Secondary | ICD-10-CM | POA: Diagnosis present

## 2018-03-31 DIAGNOSIS — G8929 Other chronic pain: Secondary | ICD-10-CM | POA: Diagnosis present

## 2018-03-31 DIAGNOSIS — S329XXA Fracture of unspecified parts of lumbosacral spine and pelvis, initial encounter for closed fracture: Secondary | ICD-10-CM | POA: Insufficient documentation

## 2018-03-31 DIAGNOSIS — I10 Essential (primary) hypertension: Secondary | ICD-10-CM | POA: Diagnosis present

## 2018-03-31 DIAGNOSIS — Z833 Family history of diabetes mellitus: Secondary | ICD-10-CM | POA: Diagnosis not present

## 2018-03-31 DIAGNOSIS — M25552 Pain in left hip: Secondary | ICD-10-CM | POA: Diagnosis present

## 2018-03-31 HISTORY — DX: Unspecified osteoarthritis, unspecified site: M19.90

## 2018-03-31 HISTORY — DX: Other chronic pain: G89.29

## 2018-03-31 HISTORY — DX: Low back pain, unspecified: M54.50

## 2018-03-31 HISTORY — DX: Pure hypercholesterolemia, unspecified: E78.00

## 2018-03-31 HISTORY — DX: Adverse effect of unspecified anesthetic, initial encounter: T41.45XA

## 2018-03-31 HISTORY — DX: Other complications of anesthesia, initial encounter: T88.59XA

## 2018-03-31 HISTORY — DX: Low back pain: M54.5

## 2018-03-31 LAB — I-STAT BETA HCG BLOOD, ED (MC, WL, AP ONLY): I-stat hCG, quantitative: <5 m[IU]/mL (ref <5)

## 2018-03-31 MED ORDER — TIZANIDINE HCL 4 MG PO TABS: 2.0000 mg | ORAL_TABLET | Freq: Four times a day (QID) | ORAL | Status: DC | PRN

## 2018-03-31 MED ORDER — FENTANYL CITRATE (PF) 100 MCG/2ML IJ SOLN
12.5000 ug | INTRAMUSCULAR | Status: DC | PRN
  Administered 2018-03-31 – 2018-04-01 (×3): 12.5 ug via INTRAVENOUS
  Filled 2018-03-31 (×4): qty 2

## 2018-03-31 MED ORDER — POTASSIUM CHLORIDE IN NACL 20-0.9 MEQ/L-% IV SOLN
INTRAVENOUS | Status: DC
  Administered 2018-03-31 – 2018-04-01 (×2): via INTRAVENOUS
  Filled 2018-03-31 (×2): qty 1000

## 2018-03-31 MED ORDER — ZOLPIDEM TARTRATE 5 MG PO TABS
5.0000 mg | ORAL_TABLET | Freq: Every day | ORAL | Status: DC
  Administered 2018-03-31 – 2018-04-01 (×2): 5 mg via ORAL
  Filled 2018-03-31 (×2): qty 1

## 2018-03-31 MED ORDER — FENTANYL 25 MCG/HR TD PT72
25.0000 ug | MEDICATED_PATCH | TRANSDERMAL | Status: DC
  Administered 2018-03-31: 25 ug via TRANSDERMAL
  Filled 2018-03-31: qty 1

## 2018-03-31 MED ORDER — HYDROCHLOROTHIAZIDE 12.5 MG PO CAPS
12.5000 mg | ORAL_CAPSULE | Freq: Every day | ORAL | Status: DC
  Administered 2018-03-31 – 2018-04-02 (×3): 12.5 mg via ORAL
  Filled 2018-03-31 (×3): qty 1

## 2018-03-31 MED ORDER — GABAPENTIN 300 MG PO CAPS
300.0000 mg | ORAL_CAPSULE | Freq: Three times a day (TID) | ORAL | Status: DC
  Administered 2018-03-31 – 2018-04-02 (×6): 300 mg via ORAL
  Filled 2018-03-31 (×6): qty 1

## 2018-03-31 MED ORDER — DIPHENHYDRAMINE HCL 25 MG PO TABS
25.0000 mg | ORAL_TABLET | Freq: Four times a day (QID) | ORAL | Status: DC | PRN
  Filled 2018-03-31: qty 1

## 2018-03-31 MED ORDER — ADULT MULTIVITAMIN W/MINERALS CH
1.0000 | ORAL_TABLET | Freq: Every day | ORAL | Status: DC
  Administered 2018-03-31 – 2018-04-02 (×3): 1 via ORAL
  Filled 2018-03-31 (×3): qty 1

## 2018-03-31 MED ORDER — ACETAMINOPHEN 500 MG PO TABS
1000.0000 mg | ORAL_TABLET | Freq: Four times a day (QID) | ORAL | Status: DC
  Administered 2018-03-31 – 2018-04-01 (×2): 1000 mg via ORAL
  Filled 2018-03-31 (×3): qty 2

## 2018-03-31 MED ORDER — LISINOPRIL 10 MG PO TABS
10.0000 mg | ORAL_TABLET | Freq: Every day | ORAL | Status: DC
  Administered 2018-03-31 – 2018-04-02 (×3): 10 mg via ORAL
  Filled 2018-03-31 (×3): qty 1

## 2018-03-31 MED ORDER — HYDROMORPHONE HCL 1 MG/ML IJ SOLN
0.5000 mg | Freq: Once | INTRAMUSCULAR | Status: AC
  Administered 2018-03-31: 0.5 mg via INTRAVENOUS
  Filled 2018-03-31: qty 1

## 2018-03-31 MED ORDER — LISINOPRIL-HYDROCHLOROTHIAZIDE 10-12.5 MG PO TABS: 1.0000 | ORAL_TABLET | Freq: Every day | ORAL | Status: DC

## 2018-03-31 MED ORDER — DIPHENHYDRAMINE HCL 25 MG PO CAPS: 25.0000 mg | ORAL_CAPSULE | Freq: Four times a day (QID) | ORAL | Status: DC | PRN

## 2018-03-31 MED ORDER — OXYCODONE HCL 5 MG PO TABS
10.0000 mg | ORAL_TABLET | Freq: Once | ORAL | Status: AC
  Administered 2018-03-31: 10 mg via ORAL
  Filled 2018-03-31: qty 2

## 2018-03-31 MED ORDER — HYDROCODONE-ACETAMINOPHEN 5-325 MG PO TABS: 1.0000 | ORAL_TABLET | ORAL | Status: DC | PRN

## 2018-03-31 MED ORDER — HYDRALAZINE HCL 20 MG/ML IJ SOLN: 10.0000 mg | INTRAMUSCULAR | Status: DC | PRN

## 2018-03-31 MED ORDER — ATORVASTATIN CALCIUM 40 MG PO TABS
40.0000 mg | ORAL_TABLET | Freq: Every day | ORAL | Status: DC
  Administered 2018-03-31 – 2018-04-01 (×2): 40 mg via ORAL
  Filled 2018-03-31 (×2): qty 1

## 2018-03-31 MED ORDER — HYDROCODONE-ACETAMINOPHEN 5-325 MG PO TABS
2.0000 | ORAL_TABLET | ORAL | Status: DC | PRN
  Administered 2018-03-31 – 2018-04-02 (×6): 2 via ORAL
  Filled 2018-03-31 (×6): qty 2
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
## 2018-03-31 NOTE — ED Notes (Signed)
Patient transported to MRI 

## 2018-03-31 NOTE — ED Notes (Signed)
Patient back in room from X-ray

## 2018-03-31 NOTE — Plan of Care (Signed)
  Problem: Pain Managment: Goal: General experience of comfort will improve Outcome: Progressing   Problem: Safety: Goal: Ability to remain free from injury will improve Outcome: Progressing   

## 2018-03-31 NOTE — ED Notes (Signed)
Attempted to ambulate pt. Pt felt like she was unable to bare weight on her left leg. When asked to take a step towards this tech, pt started to cry and sat back down. EDP, Bero, notified.

## 2018-03-31 NOTE — ED Notes (Signed)
Patient transported to X-ray 

## 2018-03-31 NOTE — ED Notes (Signed)
Pt back in room from MRI.

## 2018-03-31 NOTE — ED Notes (Signed)
Pt back from imaging
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
## 2018-03-31 NOTE — Plan of Care (Signed)
  Problem: Education: Goal: Knowledge of General Education information will improve Description: Including pain rating scale, medication(s)/side effects and non-pharmacologic comfort measures Outcome: Progressing   Problem: Pain Managment: Goal: General experience of comfort will improve Outcome: Progressing   Problem: Safety: Goal: Ability to remain free from injury will improve Outcome: Progressing   
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
## 2018-04-01 ENCOUNTER — Inpatient Hospital Stay (HOSPITAL_COMMUNITY): Payer: Medicare Other

## 2018-04-01 LAB — BASIC METABOLIC PANEL
ANION GAP: 12 (ref 5–15)
BUN: 19 mg/dL (ref 6–20)
CHLORIDE: 102 mmol/L (ref 98–111)
CO2: 25 mmol/L (ref 22–32)
Calcium: 9.8 mg/dL (ref 8.9–10.3)
Creatinine, Ser: 1.07 mg/dL — ABNORMAL HIGH (ref 0.44–1.00)
GFR calc Af Amer: >60 mL/min (ref >60)
GFR calc non Af Amer: >60 mL/min (ref >60)
GLUCOSE: 107 mg/dL — AB (ref 70–99)
POTASSIUM: 3.7 mmol/L (ref 3.5–5.1)
Sodium: 139 mmol/L (ref 135–145)

## 2018-04-01 LAB — CBC
HCT: 42 % (ref 36.0–46.0)
HEMOGLOBIN: 13.6 g/dL (ref 12.0–15.0)
MCH: 27.9 pg (ref 26.0–34.0)
MCHC: 32.4 g/dL (ref 30.0–36.0)
MCV: 86.2 fL (ref 78.0–100.0)
Platelets: 238 10*3/uL (ref 150–400)
RBC: 4.87 MIL/uL (ref 3.87–5.11)
RDW: 13 % (ref 11.5–15.5)
WBC: 9 10*3/uL (ref 4.0–10.5)

## 2018-04-01 LAB — HIV ANTIBODY (ROUTINE TESTING W REFLEX): HIV Screen 4th Generation wRfx: NONREACTIVE

## 2018-04-01 MED ORDER — IOHEXOL 300 MG/ML  SOLN
100.0000 mL | Freq: Once | INTRAMUSCULAR | Status: AC | PRN
  Administered 2018-04-01: 100 mL via INTRAVENOUS

## 2018-04-01 MED ORDER — ONDANSETRON HCL 4 MG/2ML IJ SOLN
4.0000 mg | Freq: Four times a day (QID) | INTRAMUSCULAR | Status: DC | PRN
  Administered 2018-04-01 – 2018-04-02 (×3): 4 mg via INTRAVENOUS
  Filled 2018-04-01 (×3): qty 2

## 2018-04-01 MED ORDER — IOHEXOL 300 MG/ML  SOLN: 100.0000 mL | Freq: Once | INTRAMUSCULAR | Status: DC

## 2018-04-01 NOTE — Plan of Care (Signed)
  Problem: Education: Goal: Knowledge of General Education information will improve Description Including pain rating scale, medication(s)/side effects and non-pharmacologic comfort measures Outcome: Progressing   Problem: Clinical Measurements: Goal: Ability to maintain clinical measurements within normal limits will improve Outcome: Progressing Goal: Will remain free from infection Outcome: Progressing Goal: Respiratory complications will improve Outcome: Not Applicable

## 2018-04-01 NOTE — Plan of Care (Signed)
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
## 2018-04-02 MED ORDER — HYDROCODONE-ACETAMINOPHEN 5-325 MG PO TABS: 1.0000 | ORAL_TABLET | ORAL | 0 refills | Status: DC | PRN

## 2018-04-02 MED ORDER — ACETAMINOPHEN 500 MG PO TABS: 1000.0000 mg | ORAL_TABLET | Freq: Three times a day (TID) | ORAL | 0 refills | Status: DC | PRN
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
## 2018-04-02 NOTE — Discharge Instructions (Signed)
° °  Acute Pain, Adult Acute pain is a type of pain that may last for just a few days or as long as six months. It is often related to an illness, injury, or medical procedure. Acute pain may be mild, moderate, or severe. It usually goes away once your injury has healed or you are no longer ill. Pain can make it hard for you to do daily activities. It can cause anxiety and lead to other problems if left untreated. Treatment depends on the cause and severity of your acute pain. Follow these instructions at home:  Check your pain level as told by your health care provider.  Take over-the-counter and prescription medicines only as told by your health care provider.  If you are taking prescription pain medicine: ? Ask your health care provider about taking a stool softener or laxative to prevent constipation. ? Do not stop taking the medicine suddenly. Talk to your health care provider about how and when to discontinue prescription pain medicine. ? If your pain is severe, do not take more pills than instructed by your health care provider. ? Do not take other over-the-counter pain medicines in addition to this medicine unless told by your health care provider. ? Do not drive or operate heavy machinery while taking prescription pain medicine.  Apply ice or heat as told by your health care provider. These may reduce swelling and pain.  Ask your health care provider if other strategies such as distraction, relaxation, or physical therapies can help your pain.  Keep all follow-up visits as told by your health care provider. This is important. Contact a health care provider if:  You have pain that is not controlled by medicine.  Your pain does not improve or gets worse.  You have side effects from pain medicines, such as vomitingor confusion. Get help right away if:  You have severe pain.  You have trouble breathing.  You lose consciousness.  You have chest pain or pressure that lasts for  more than a few minutes. Along with the chest pain you may: ? Have pain or discomfort in one or both arms, your back, neck, jaw, or stomach. ? Have shortness of breath. ? Break out in a cold sweat. ? Feel nauseous. ? Become light-headed. These symptoms may represent a serious problem that is an emergency. Do not wait to see if the symptoms will go away. Get medical help right away. Call your local emergency services (911 in the [place] Do not drive yourself to the hospital. This information is not intended to replace advice given to you by your health care provider. Make sure you discuss any questions you have with your health care provider. Document Released: 07/02/2015 Document Revised: 11/24/2015 Document Reviewed: 07/02/2015 Elsevier Interactive Patient Education  2018 [employer]. We have placed a referral for you to participate in outpatient physician therapy. You should receive a phone call to schedule this.

## 2018-04-02 NOTE — Plan of Care (Signed)
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
## 2018-04-15 ENCOUNTER — Ambulatory Visit: Payer: Medicare Other | Attending: Physician Assistant | Admitting: Physical Therapy

## 2018-04-15 ENCOUNTER — Other Ambulatory Visit: Payer: Self-pay

## 2018-04-15 ENCOUNTER — Encounter: Payer: Self-pay | Admitting: Physical Therapy

## 2018-04-15 DIAGNOSIS — R262 Difficulty in walking, not elsewhere classified: Secondary | ICD-10-CM | POA: Insufficient documentation

## 2018-04-15 DIAGNOSIS — M5442 Lumbago with sciatica, left side: Secondary | ICD-10-CM | POA: Insufficient documentation

## 2018-04-15 DIAGNOSIS — M6281 Muscle weakness (generalized): Secondary | ICD-10-CM | POA: Insufficient documentation
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
## 2018-04-15 NOTE — Patient Instructions (Signed)
Supine Transversus Abdominis Bracing - Hands on Thighs reps: 10 sets: 1 daily: 1 weekly: 7   Exercise image step 1   Exercise image step 2 Setup  Begin lying on your back with your knees bent and feet resting on the floor. Movement  Draw in your abdominals as if you are pulling your belly button toward the floor. Hold, then relax, and repeat. Tip  Make sure to keep your back flat against the floor and avoid performing a sit-up motion. Supine Lower Trunk Rotation reps: 10 sets: 1 daily: 1 weekly: 7   Exercise image step 1   Exercise image step 2 Setup  Begin lying on your back with your feet flat on the floor and your arms straight out to your sides. Movement  Lower your knees to one side, return to center, and repeat on the other side. Tip  Make sure to activate your core muscles and keep both of your shoulders in contact with the ground throughout the exercise. Supine Mid-Thoracic Spine Mobilization with Taped Tennis Balls I's, Y's and T reps: 10 sets: 3 daily: 1 weekly: 7   Exercise image step 1  Educated to perform tennis ball roll out in chair

## 2018-04-20 ENCOUNTER — Ambulatory Visit: Payer: Medicare Other | Admitting: Physical Therapy

## 2018-04-20 ENCOUNTER — Encounter: Payer: Self-pay | Admitting: Physical Therapy

## 2018-04-20 DIAGNOSIS — M5442 Lumbago with sciatica, left side: Secondary | ICD-10-CM | POA: Diagnosis not present

## 2018-04-20 DIAGNOSIS — R262 Difficulty in walking, not elsewhere classified: Secondary | ICD-10-CM

## 2018-04-20 DIAGNOSIS — M6281 Muscle weakness (generalized): Secondary | ICD-10-CM
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
## 2018-04-24 ENCOUNTER — Ambulatory Visit: Payer: Medicare Other | Admitting: Physical Therapy

## 2018-04-28 ENCOUNTER — Ambulatory Visit (HOSPITAL_COMMUNITY)
Admission: EM | Admit: 2018-04-28 | Discharge: 2018-04-28 | Disposition: A | Discharge status: Home / Self Care | Payer: Medicare Other | Attending: Family Medicine | Admitting: Family Medicine

## 2018-04-28 ENCOUNTER — Encounter (HOSPITAL_COMMUNITY): Payer: Self-pay | Admitting: Emergency Medicine

## 2018-04-28 ENCOUNTER — Ambulatory Visit (INDEPENDENT_AMBULATORY_CARE_PROVIDER_SITE_OTHER): Payer: Medicare Other

## 2018-04-28 ENCOUNTER — Encounter: Payer: Medicare Other | Admitting: Physical Therapy

## 2018-04-28 DIAGNOSIS — M79671 Pain in right foot: Secondary | ICD-10-CM

## 2018-04-28 DIAGNOSIS — R2241 Localized swelling, mass and lump, right lower limb: Secondary | ICD-10-CM | POA: Diagnosis not present

## 2018-04-28 MED ORDER — SULFAMETHOXAZOLE-TRIMETHOPRIM 800-160 MG PO TABS: 1.0000 | ORAL_TABLET | Freq: Two times a day (BID) | ORAL | 0 refills | Status: AC
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
## 2018-04-28 NOTE — Discharge Instructions (Addendum)
We will go ahead and cover you for possible infection of the foot. Make sure you finish the full course of antibiotics Keep taking the other medications you are to prescribed for pain If symptoms continue despite treatment please follow-up with the podiatrist
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
## 2018-04-29 ENCOUNTER — Ambulatory Visit: Payer: Medicare Other | Admitting: Physical Therapy

## 2018-05-04 ENCOUNTER — Encounter: Payer: Medicare Other | Admitting: Physical Therapy

## 2018-05-06 ENCOUNTER — Encounter: Payer: Medicare Other | Admitting: Physical Therapy

## 2018-05-11 ENCOUNTER — Encounter: Payer: Medicare Other | Admitting: Physical Therapy

## 2018-05-13 ENCOUNTER — Encounter: Payer: Medicare Other | Admitting: Physical Therapy

## 2018-05-18 ENCOUNTER — Encounter: Payer: Medicare Other | Admitting: Physical Therapy

## 2018-05-20 ENCOUNTER — Encounter: Payer: Medicare Other | Admitting: Physical Therapy

## 2018-05-21 ENCOUNTER — Encounter: Payer: Self-pay | Admitting: Physical Therapy

## 2018-05-21 ENCOUNTER — Ambulatory Visit: Payer: Medicare Other | Attending: Physician Assistant | Admitting: Physical Therapy

## 2018-05-21 DIAGNOSIS — R262 Difficulty in walking, not elsewhere classified: Secondary | ICD-10-CM | POA: Insufficient documentation

## 2018-05-21 DIAGNOSIS — M6281 Muscle weakness (generalized): Secondary | ICD-10-CM | POA: Insufficient documentation

## 2018-05-21 DIAGNOSIS — M5442 Lumbago with sciatica, left side: Secondary | ICD-10-CM
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
## 2018-05-22 ENCOUNTER — Encounter: Payer: Self-pay | Admitting: Physical Therapy

## 2018-05-25 ENCOUNTER — Encounter: Payer: Medicare Other | Admitting: Physical Therapy

## 2018-05-27 ENCOUNTER — Encounter: Payer: Medicare Other | Admitting: Physical Therapy

## 2018-06-01 ENCOUNTER — Encounter: Payer: Self-pay | Admitting: Physical Therapy

## 2018-06-01 ENCOUNTER — Ambulatory Visit: Payer: Medicare Other | Attending: Physician Assistant | Admitting: Physical Therapy

## 2018-06-01 DIAGNOSIS — M5442 Lumbago with sciatica, left side: Secondary | ICD-10-CM | POA: Diagnosis not present

## 2018-06-01 DIAGNOSIS — R262 Difficulty in walking, not elsewhere classified: Secondary | ICD-10-CM | POA: Insufficient documentation

## 2018-06-01 DIAGNOSIS — M6281 Muscle weakness (generalized): Secondary | ICD-10-CM | POA: Diagnosis present
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
## 2018-06-05 ENCOUNTER — Encounter: Payer: Self-pay | Admitting: Physical Therapy

## 2018-06-05 ENCOUNTER — Ambulatory Visit: Payer: Medicare Other | Admitting: Physical Therapy

## 2018-06-05 DIAGNOSIS — M5442 Lumbago with sciatica, left side: Secondary | ICD-10-CM

## 2018-06-05 DIAGNOSIS — M6281 Muscle weakness (generalized): Secondary | ICD-10-CM

## 2018-06-05 DIAGNOSIS — R262 Difficulty in walking, not elsewhere classified: Secondary | ICD-10-CM
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
## 2018-06-08 ENCOUNTER — Ambulatory Visit: Payer: Medicare Other | Admitting: Physical Therapy

## 2018-06-10 ENCOUNTER — Ambulatory Visit: Payer: Medicare Other | Admitting: Physical Therapy

## 2018-06-10 ENCOUNTER — Encounter: Payer: Self-pay | Admitting: Physical Therapy

## 2018-06-10 DIAGNOSIS — M5442 Lumbago with sciatica, left side: Secondary | ICD-10-CM

## 2018-06-10 DIAGNOSIS — M6281 Muscle weakness (generalized): Secondary | ICD-10-CM

## 2018-06-10 DIAGNOSIS — R262 Difficulty in walking, not elsewhere classified: Secondary | ICD-10-CM
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
## 2018-06-15 ENCOUNTER — Encounter: Payer: Self-pay | Admitting: Physical Therapy

## 2018-06-15 ENCOUNTER — Ambulatory Visit: Payer: Medicare Other | Admitting: Physical Therapy

## 2018-06-15 DIAGNOSIS — R262 Difficulty in walking, not elsewhere classified: Secondary | ICD-10-CM

## 2018-06-15 DIAGNOSIS — M5442 Lumbago with sciatica, left side: Secondary | ICD-10-CM | POA: Diagnosis not present

## 2018-06-15 DIAGNOSIS — M6281 Muscle weakness (generalized): Secondary | ICD-10-CM
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
## 2018-06-18 ENCOUNTER — Ambulatory Visit: Payer: Medicare Other | Admitting: Physical Therapy

## 2018-06-18 ENCOUNTER — Encounter: Payer: Self-pay | Admitting: Physical Therapy

## 2018-06-18 DIAGNOSIS — M5442 Lumbago with sciatica, left side: Secondary | ICD-10-CM | POA: Diagnosis not present

## 2018-06-18 DIAGNOSIS — M6281 Muscle weakness (generalized): Secondary | ICD-10-CM

## 2018-06-18 DIAGNOSIS — R262 Difficulty in walking, not elsewhere classified: Secondary | ICD-10-CM
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
## 2018-06-19 ENCOUNTER — Ambulatory Visit: Payer: Medicare Other | Admitting: Physical Therapy

## 2018-06-22 ENCOUNTER — Ambulatory Visit: Payer: Medicare Other | Admitting: Physical Therapy

## 2018-06-22 ENCOUNTER — Encounter: Payer: Self-pay | Admitting: Physical Therapy

## 2018-06-22 DIAGNOSIS — M5442 Lumbago with sciatica, left side: Secondary | ICD-10-CM | POA: Diagnosis not present

## 2018-06-22 DIAGNOSIS — M6281 Muscle weakness (generalized): Secondary | ICD-10-CM

## 2018-06-22 DIAGNOSIS — R262 Difficulty in walking, not elsewhere classified: Secondary | ICD-10-CM
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
## 2018-07-08 ENCOUNTER — Ambulatory Visit: Payer: Medicare Other | Attending: Physician Assistant | Admitting: Physical Therapy

## 2018-07-08 ENCOUNTER — Encounter: Payer: Self-pay | Admitting: Physical Therapy

## 2018-07-08 DIAGNOSIS — M5442 Lumbago with sciatica, left side: Secondary | ICD-10-CM | POA: Insufficient documentation

## 2018-07-08 DIAGNOSIS — M6281 Muscle weakness (generalized): Secondary | ICD-10-CM | POA: Diagnosis present

## 2018-07-08 DIAGNOSIS — R262 Difficulty in walking, not elsewhere classified: Secondary | ICD-10-CM | POA: Diagnosis present
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
## 2018-07-10 ENCOUNTER — Ambulatory Visit: Payer: Medicare Other | Admitting: Physical Therapy

## 2018-07-13 ENCOUNTER — Ambulatory Visit: Payer: Medicare Other | Admitting: Physical Therapy

## 2018-07-13 ENCOUNTER — Encounter: Payer: Self-pay | Admitting: Physical Therapy

## 2018-07-13 DIAGNOSIS — R262 Difficulty in walking, not elsewhere classified: Secondary | ICD-10-CM

## 2018-07-13 DIAGNOSIS — M5442 Lumbago with sciatica, left side: Secondary | ICD-10-CM | POA: Diagnosis not present

## 2018-07-13 DIAGNOSIS — M6281 Muscle weakness (generalized): Secondary | ICD-10-CM
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
## 2018-07-16 ENCOUNTER — Encounter: Payer: Self-pay | Admitting: Physical Therapy

## 2018-07-16 ENCOUNTER — Ambulatory Visit: Payer: Medicare Other | Admitting: Physical Therapy

## 2018-07-16 DIAGNOSIS — M5442 Lumbago with sciatica, left side: Secondary | ICD-10-CM | POA: Diagnosis not present

## 2018-07-16 DIAGNOSIS — M6281 Muscle weakness (generalized): Secondary | ICD-10-CM

## 2018-07-16 DIAGNOSIS — R262 Difficulty in walking, not elsewhere classified: Secondary | ICD-10-CM
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
## 2018-07-22 ENCOUNTER — Encounter: Payer: Self-pay | Admitting: Physical Therapy

## 2018-07-22 ENCOUNTER — Ambulatory Visit: Payer: Medicare Other | Admitting: Physical Therapy

## 2018-07-22 DIAGNOSIS — M5442 Lumbago with sciatica, left side: Secondary | ICD-10-CM | POA: Diagnosis not present

## 2018-07-22 DIAGNOSIS — M6281 Muscle weakness (generalized): Secondary | ICD-10-CM

## 2018-07-22 DIAGNOSIS — R262 Difficulty in walking, not elsewhere classified: Secondary | ICD-10-CM

## 2018-07-23 ENCOUNTER — Encounter: Payer: Self-pay | Admitting: Physical Therapy

## 2018-07-23 ENCOUNTER — Ambulatory Visit: Payer: Medicare Other | Admitting: Physical Therapy

## 2018-07-23 DIAGNOSIS — M6281 Muscle weakness (generalized): Secondary | ICD-10-CM

## 2018-07-23 DIAGNOSIS — M5442 Lumbago with sciatica, left side: Secondary | ICD-10-CM

## 2018-07-23 DIAGNOSIS — R262 Difficulty in walking, not elsewhere classified: Secondary | ICD-10-CM
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
## 2018-07-30 ENCOUNTER — Ambulatory Visit: Payer: Medicare Other | Admitting: Physical Therapy

## 2018-07-30 DIAGNOSIS — M6281 Muscle weakness (generalized): Secondary | ICD-10-CM

## 2018-07-30 DIAGNOSIS — M5442 Lumbago with sciatica, left side: Secondary | ICD-10-CM

## 2018-07-30 DIAGNOSIS — R262 Difficulty in walking, not elsewhere classified: Secondary | ICD-10-CM
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
## 2018-07-31 ENCOUNTER — Encounter: Payer: Medicare Other | Admitting: Physical Therapy

## 2018-08-31 ENCOUNTER — Encounter: Payer: Self-pay | Admitting: *Deleted

## 2019-04-26 ENCOUNTER — Other Ambulatory Visit: Payer: Self-pay | Admitting: Orthopaedic Surgery

## 2019-04-26 DIAGNOSIS — M5416 Radiculopathy, lumbar region: Secondary | ICD-10-CM

## 2019-05-17 ENCOUNTER — Other Ambulatory Visit: Payer: Self-pay

## 2019-05-17 ENCOUNTER — Ambulatory Visit
Admission: RE | Admit: 2019-05-17 | Discharge: 2019-05-17 | Disposition: A | Discharge status: Home / Self Care | Payer: Medicare Other | Source: Ambulatory Visit | Attending: Orthopaedic Surgery | Admitting: Orthopaedic Surgery

## 2019-05-17 DIAGNOSIS — M5416 Radiculopathy, lumbar region: Secondary | ICD-10-CM

## 2019-05-17 MED ORDER — GADOBENATE DIMEGLUMINE 529 MG/ML IV SOLN
15.0000 mL | Freq: Once | INTRAVENOUS | Status: AC | PRN
  Administered 2019-05-17: 15 mL via INTRAVENOUS

## 2019-07-26 DIAGNOSIS — M545 Low back pain: Secondary | ICD-10-CM | POA: Diagnosis not present

## 2019-07-26 DIAGNOSIS — Z6829 Body mass index (BMI) 29.0-29.9, adult: Secondary | ICD-10-CM | POA: Diagnosis not present

## 2019-07-26 DIAGNOSIS — M4716 Other spondylosis with myelopathy, lumbar region: Secondary | ICD-10-CM | POA: Diagnosis not present

## 2019-07-26 DIAGNOSIS — G894 Chronic pain syndrome: Secondary | ICD-10-CM | POA: Diagnosis not present

## 2019-08-13 IMAGING — CT CT T SPINE W/O CM
3 of 6 series · 10 of 33 positions shown, 11 images · non-contrast
Comparison: Cervical MRI 02/10/2013. lumbar MRI 07/14/2015 and
10/13/2016

CLINICAL DATA: Neck pain and back pain

EXAM:
CT CERVICAL, THORACIC, AND LUMBAR SPINE WITHOUT CONTRAST
TECHNIQUE: Multidetector CT imaging of the cervical, thoracic and lumbar spine
was performed without intravenous contrast. Multiplanar CT image
reconstructions were also generated.

[Series 5: t-spine 2.0 st · axial · 0.28mm/px · z∈[-382,-210]mm · 4 of 130 slices shown, 5 images]
[im 22/130  soft-tissue]
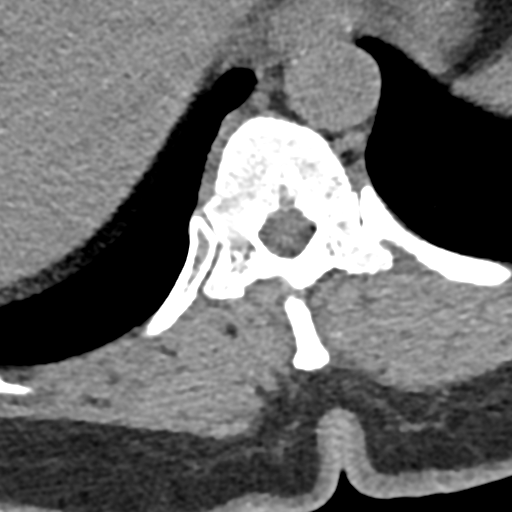
[im 22/130  bone]
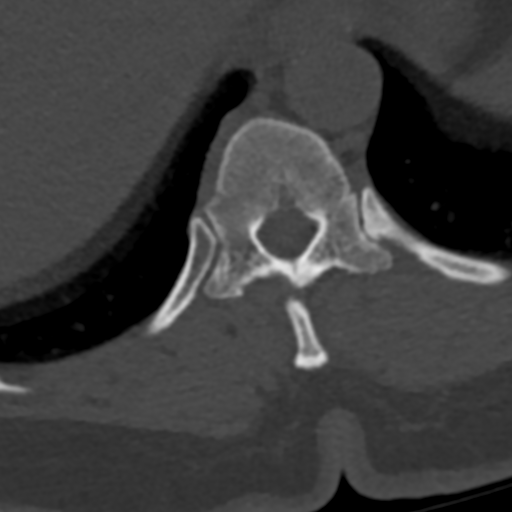
[im 44/130  bone]
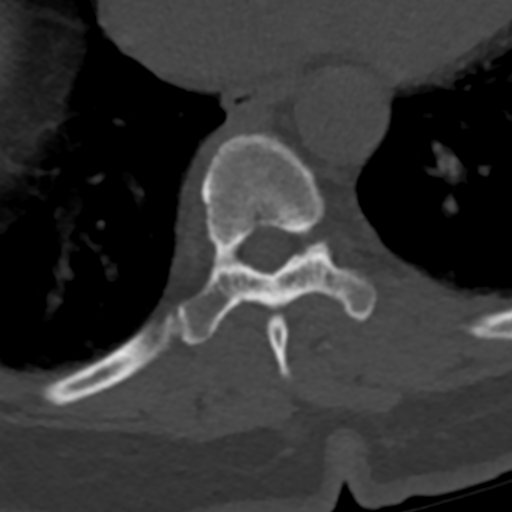
[im 87/130  bone]
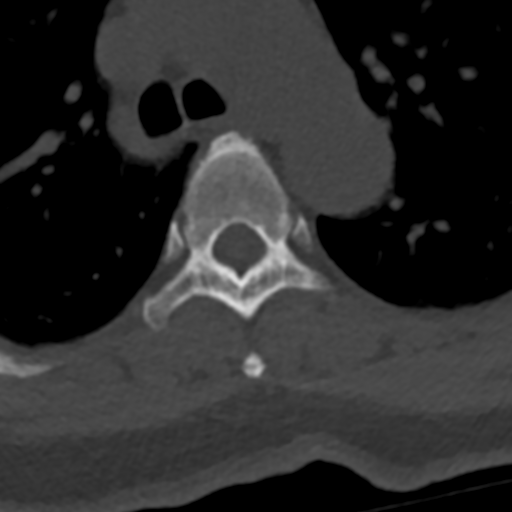
[im 108/130  bone]
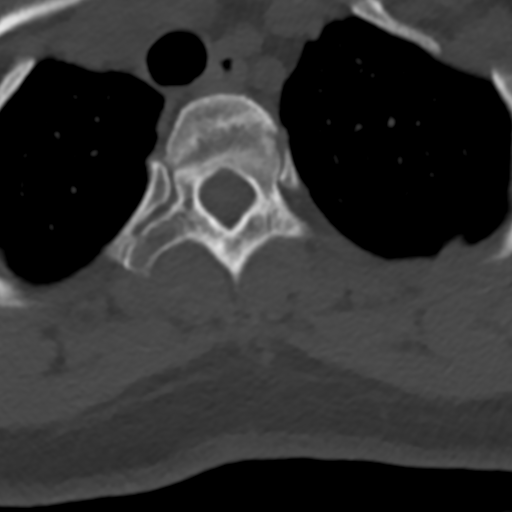

[Series 8: coronal st · coronal · 0.26mm/px · 1 of 61 slices shown]
[im 31/61  bone]
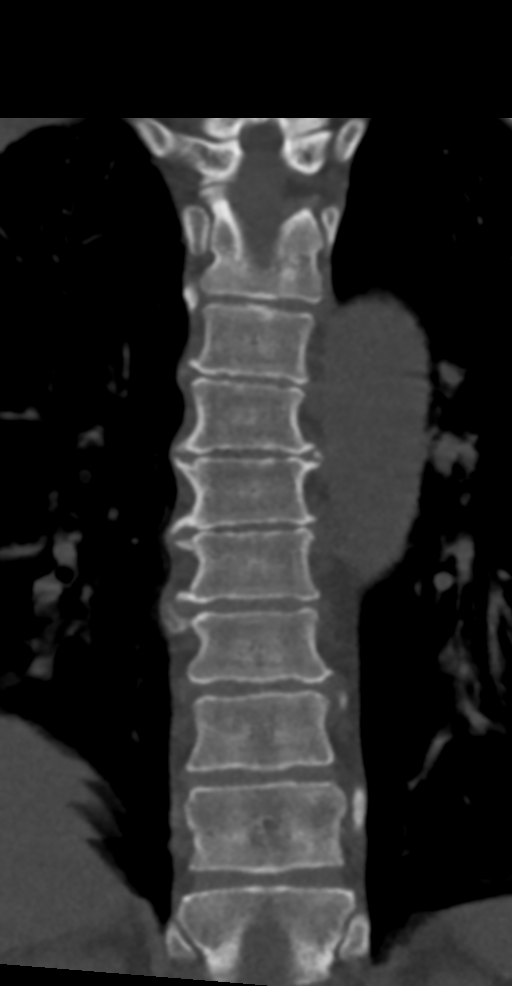

[Series 17: sagittal st · sagittal · 0.27mm/px · 5 of 61 slices shown]
[im 11/61  bone]
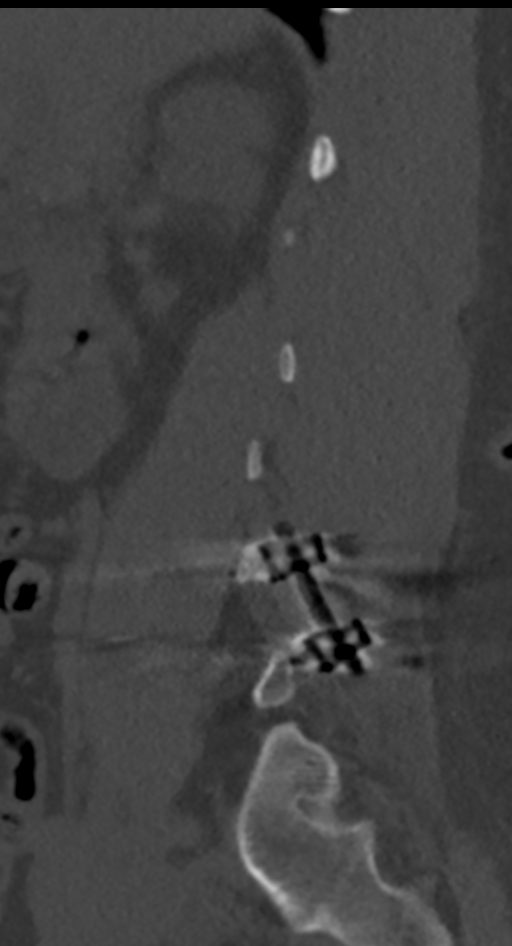
[im 21/61  bone]
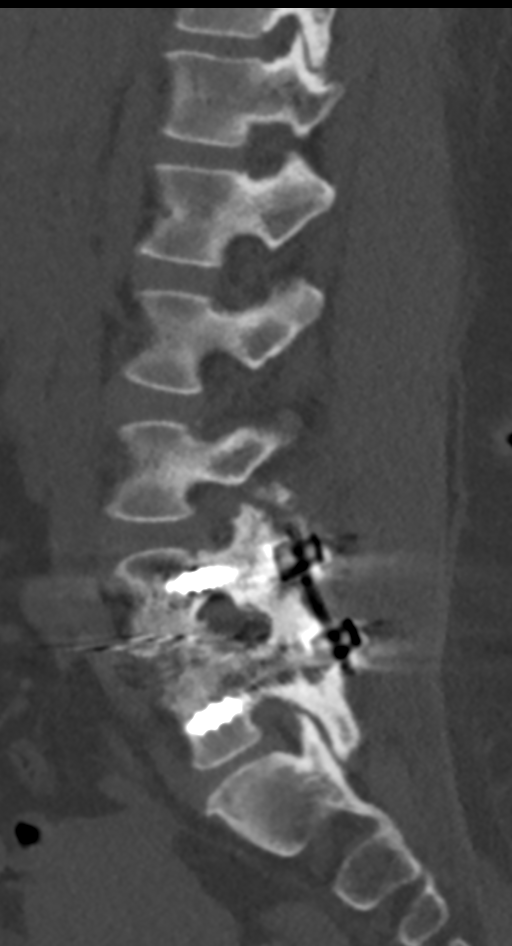
[im 31/61  bone]
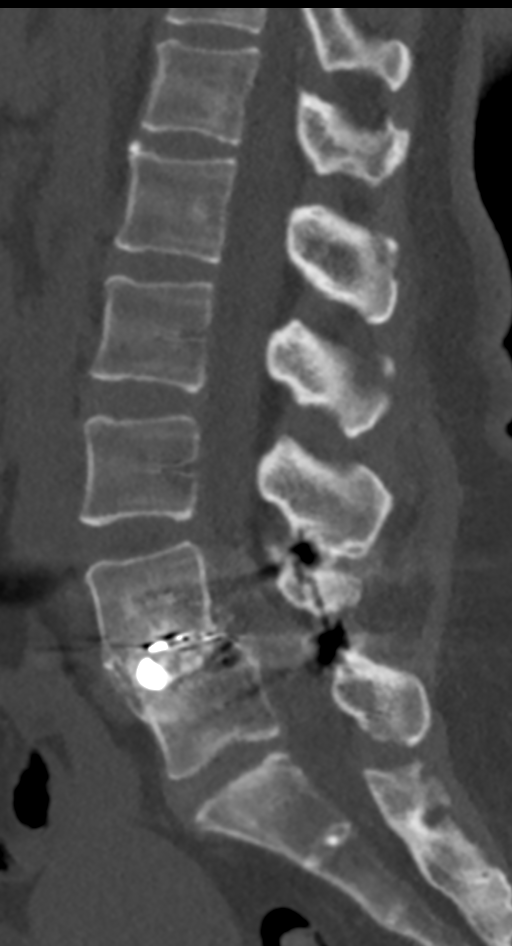
[im 41/61  bone]
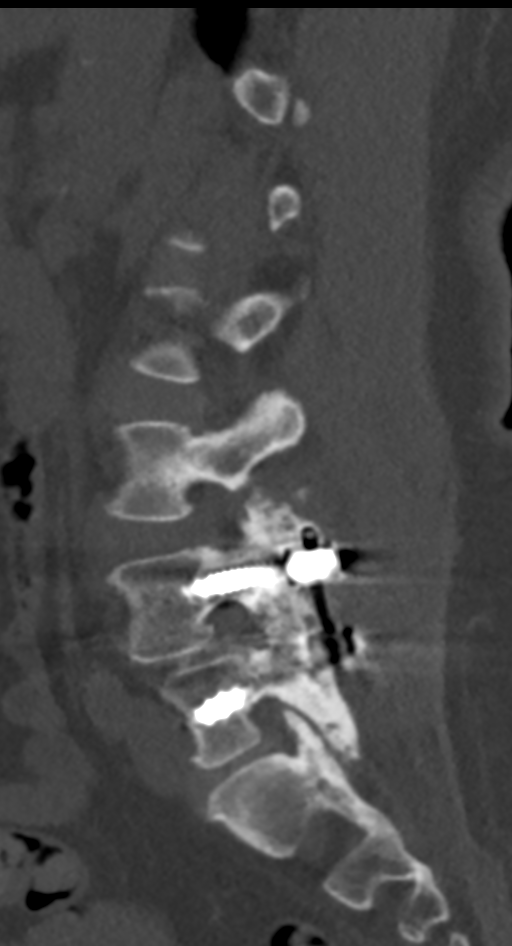
[im 51/61  bone]
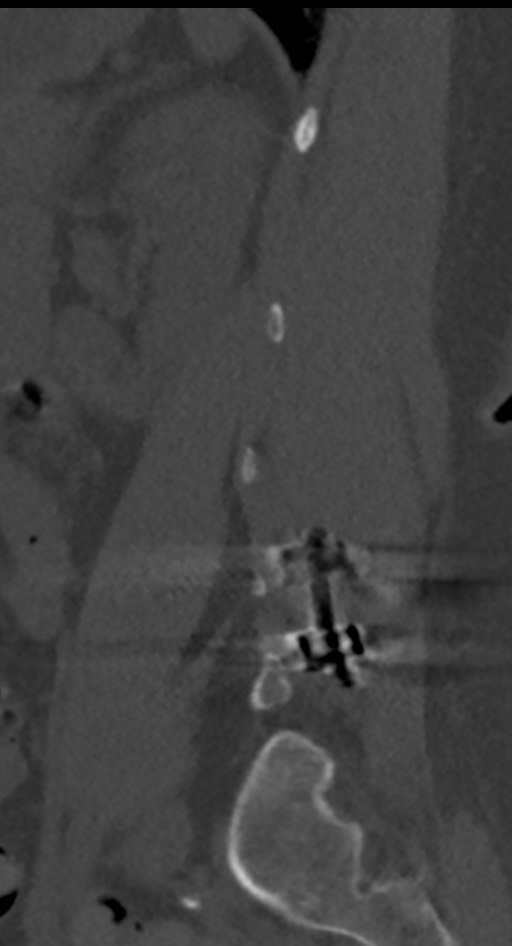

[10 of 33 positions shown; findings below may reference images not displayed]

FINDINGS: CT CERVICAL SPINE FINDINGS

Alignment: Mild curvature convex to the right. No antero or
retrolisthesis.

Skull base and vertebrae: No primary bone lesion.

Soft tissues and spinal canal: Negative

Disc levels:  Foramen magnum is widely patent.  C1-2 is normal.

C2-3: Facet osteoarthritis on the left which could be painful. No
canal or foraminal stenosis.

C3-4: Bilateral uncovertebral prominence. Mild bulging of the disc.
No compressive canal or foraminal stenosis.

C4-5: Degenerative spondylosis with endplate osteophytes and bulging
of the disc. Osteophytic encroachment upon the canal and foramina.
Exiting C5 nerves would be at some risk.

C5-6: Spondylosis with endplate osteophytes and bulging of the disc.
Osteophytic encroachment upon the canal and foramina. Mild bilateral
bony foraminal narrowing.

C6-7: Normal interspace.

C7-T1: Normal interspace.

Upper chest: Negative

CT THORACIC SPINE FINDINGS

Alignment: Mild curvature convex to the right. No antero or
retrolisthesis.

Vertebrae: Sclerotic change of the left side of the T4 vertebral
body. No evidence of destructive change. As an isolated finding,
this is likely to be benign.

Paraspinal and other soft tissues: Negative

Disc levels: Degenerative disc disease throughout the thoracic
region from T2-3 through T8-9 with disc space narrowing and small
marginal osteophytes. No apparent compressive stenosis of the
central canal. Facet osteoarthritis in the region as well. Foraminal
narrowing that could possibly be symptomatic most pronounced at T2-3
but also at T3-4 to a lesser degree.

CT LUMBAR SPINE FINDINGS

Segmentation: 5 lumbar type vertebral bodies.

Alignment: Chronic fixed anterolisthesis at L4-5 measuring 4 mm.

Vertebrae: No fracture or primary bone lesion.  Solid union at L4-5.

Paraspinal and other soft tissues: Negative

Disc levels: L1-2: Normal.

L2-3: Bilateral facet osteoarthritis without slippage or stenosis.

L3-4: Mild bulging of the disc. Advanced bilateral facet arthropathy
with hypertrophic change. Spinal stenosis at this level that could
be significant. The appearance could worsen with standing or
flexion.

L4-5: Previous posterior decompression, diskectomy and fusion
appears solid with wide patency of the canal and foramina. No
hardware complication.

L5-S1: Bulging of the disc. Facet osteoarthritis. No apparent
compressive stenosis. Findings could contribute to low back pain.
IMPRESSION: Cervical region: Degenerative spondylosis at C4-5 and C5-6 that
could be a cause of neck pain. Some potential to affect the exiting
nerve roots, more so at C4-5 than C5-6. Facet arthropathy on the
left at C2-3 that could contribute to neck pain.

Thoracic region: No acute finding. Chronic degenerative disc disease
and degenerative facet disease. Foraminal stenosis worse at T2-3
than T3-4, which could possibly be symptomatic. Likely benign
sclerotic focus within the left T4 vertebral body. In the absence of
any other similar finding or of a history of cancer, this is quite
likely incidental and insignificant.

Lumbar region: Previous PLIF L4-5 with solid union and wide patency
of the canal and foramina. Worsening adjacent segment degenerative
change at L3-4. Bulging of the disc. Facet degeneration and
hypertrophy. Findings could certainly relate to back pain. Stenosis
could cause neural compression. The appearance could worsen with
standing or flexion. Mild degenerative changes at L2-3 and L5-S1.

## 2019-08-16 DIAGNOSIS — M47816 Spondylosis without myelopathy or radiculopathy, lumbar region: Secondary | ICD-10-CM | POA: Diagnosis not present

## 2019-08-26 DIAGNOSIS — Z79891 Long term (current) use of opiate analgesic: Secondary | ICD-10-CM | POA: Diagnosis not present

## 2019-08-26 DIAGNOSIS — G894 Chronic pain syndrome: Secondary | ICD-10-CM | POA: Diagnosis not present

## 2019-08-26 DIAGNOSIS — Z79899 Other long term (current) drug therapy: Secondary | ICD-10-CM | POA: Diagnosis not present

## 2019-08-26 DIAGNOSIS — M4716 Other spondylosis with myelopathy, lumbar region: Secondary | ICD-10-CM | POA: Diagnosis not present

## 2019-08-26 DIAGNOSIS — M545 Low back pain: Secondary | ICD-10-CM | POA: Diagnosis not present

## 2019-08-31 DIAGNOSIS — Z1389 Encounter for screening for other disorder: Secondary | ICD-10-CM | POA: Diagnosis not present

## 2019-08-31 DIAGNOSIS — I10 Essential (primary) hypertension: Secondary | ICD-10-CM | POA: Diagnosis not present

## 2019-08-31 DIAGNOSIS — Z0000 Encounter for general adult medical examination without abnormal findings: Secondary | ICD-10-CM | POA: Diagnosis not present

## 2019-08-31 DIAGNOSIS — R7309 Other abnormal glucose: Secondary | ICD-10-CM | POA: Diagnosis not present

## 2019-08-31 DIAGNOSIS — E785 Hyperlipidemia, unspecified: Secondary | ICD-10-CM | POA: Diagnosis not present

## 2019-08-31 DIAGNOSIS — G894 Chronic pain syndrome: Secondary | ICD-10-CM | POA: Diagnosis not present

## 2019-09-14 DIAGNOSIS — Z01419 Encounter for gynecological examination (general) (routine) without abnormal findings: Secondary | ICD-10-CM | POA: Diagnosis not present

## 2019-09-14 DIAGNOSIS — Z1231 Encounter for screening mammogram for malignant neoplasm of breast: Secondary | ICD-10-CM | POA: Diagnosis not present

## 2019-09-14 DIAGNOSIS — Z6831 Body mass index (BMI) 31.0-31.9, adult: Secondary | ICD-10-CM | POA: Diagnosis not present

## 2019-09-14 DIAGNOSIS — N760 Acute vaginitis: Secondary | ICD-10-CM | POA: Diagnosis not present

## 2019-09-14 DIAGNOSIS — Z124 Encounter for screening for malignant neoplasm of cervix: Secondary | ICD-10-CM | POA: Diagnosis not present

## 2019-09-24 DIAGNOSIS — M545 Low back pain: Secondary | ICD-10-CM | POA: Diagnosis not present

## 2019-09-24 DIAGNOSIS — G894 Chronic pain syndrome: Secondary | ICD-10-CM | POA: Diagnosis not present

## 2019-09-24 DIAGNOSIS — M4716 Other spondylosis with myelopathy, lumbar region: Secondary | ICD-10-CM | POA: Diagnosis not present

## 2019-09-24 DIAGNOSIS — Z6829 Body mass index (BMI) 29.0-29.9, adult: Secondary | ICD-10-CM | POA: Diagnosis not present

## 2019-10-03 ENCOUNTER — Other Ambulatory Visit: Payer: Self-pay

## 2019-10-03 ENCOUNTER — Ambulatory Visit (INDEPENDENT_AMBULATORY_CARE_PROVIDER_SITE_OTHER): Payer: Medicare HMO

## 2019-10-03 ENCOUNTER — Ambulatory Visit (HOSPITAL_COMMUNITY)
Admission: EM | Admit: 2019-10-03 | Discharge: 2019-10-03 | Disposition: A | Discharge status: Home / Self Care | Payer: Medicare HMO | Attending: Emergency Medicine | Admitting: Emergency Medicine

## 2019-10-03 ENCOUNTER — Encounter (HOSPITAL_COMMUNITY): Payer: Self-pay

## 2019-10-03 DIAGNOSIS — M79671 Pain in right foot: Secondary | ICD-10-CM

## 2019-10-03 DIAGNOSIS — M7989 Other specified soft tissue disorders: Secondary | ICD-10-CM | POA: Diagnosis not present

## 2019-10-03 MED ORDER — INDOMETHACIN 50 MG PO CAPS: 50.0000 mg | ORAL_CAPSULE | Freq: Three times a day (TID) | ORAL | 0 refills | Status: DC | PRN
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
## 2019-10-03 NOTE — ED Triage Notes (Signed)
Pt presents with right foot swelling & pain for past few days with no injury involved.

## 2019-10-03 NOTE — Discharge Instructions (Addendum)
Your xray is normal again today.  I have low suspicion for an infection to the foot so will hold off on antibiotics at this time.  I would like you to take the prescribed antiinflammatory to help with pain and swelling- start out taking three times a day for the next 2-3 days, then decrease to twice a day until you can stop. May continue with tylenol and/or oxycodone as needed for pain.  Ice, elevation to help with pain. Activity as tolerated.  If symptoms worsen or do not improve in the next week to return to be seen or to follow up with your PCP.

## 2019-10-04 DIAGNOSIS — I10 Essential (primary) hypertension: Secondary | ICD-10-CM | POA: Diagnosis not present

## 2019-11-05 DIAGNOSIS — M545 Low back pain: Secondary | ICD-10-CM | POA: Diagnosis not present

## 2019-11-05 DIAGNOSIS — G894 Chronic pain syndrome: Secondary | ICD-10-CM | POA: Diagnosis not present

## 2019-11-05 DIAGNOSIS — Z6829 Body mass index (BMI) 29.0-29.9, adult: Secondary | ICD-10-CM | POA: Diagnosis not present

## 2019-11-05 DIAGNOSIS — M4716 Other spondylosis with myelopathy, lumbar region: Secondary | ICD-10-CM | POA: Diagnosis not present

## 2019-12-03 DIAGNOSIS — M545 Low back pain: Secondary | ICD-10-CM | POA: Diagnosis not present

## 2019-12-03 DIAGNOSIS — G894 Chronic pain syndrome: Secondary | ICD-10-CM | POA: Diagnosis not present

## 2019-12-03 DIAGNOSIS — M4716 Other spondylosis with myelopathy, lumbar region: Secondary | ICD-10-CM | POA: Diagnosis not present

## 2019-12-29 DIAGNOSIS — M4716 Other spondylosis with myelopathy, lumbar region: Secondary | ICD-10-CM | POA: Diagnosis not present

## 2019-12-29 DIAGNOSIS — G894 Chronic pain syndrome: Secondary | ICD-10-CM | POA: Diagnosis not present

## 2019-12-29 DIAGNOSIS — Z6829 Body mass index (BMI) 29.0-29.9, adult: Secondary | ICD-10-CM | POA: Diagnosis not present

## 2020-01-28 DIAGNOSIS — Z6829 Body mass index (BMI) 29.0-29.9, adult: Secondary | ICD-10-CM | POA: Diagnosis not present

## 2020-01-28 DIAGNOSIS — M545 Low back pain: Secondary | ICD-10-CM | POA: Diagnosis not present

## 2020-01-28 DIAGNOSIS — M4716 Other spondylosis with myelopathy, lumbar region: Secondary | ICD-10-CM | POA: Diagnosis not present

## 2020-01-28 DIAGNOSIS — M5106 Intervertebral disc disorders with myelopathy, lumbar region: Secondary | ICD-10-CM | POA: Diagnosis not present

## 2020-01-28 DIAGNOSIS — G894 Chronic pain syndrome: Secondary | ICD-10-CM | POA: Diagnosis not present

## 2020-02-25 DIAGNOSIS — Z79899 Other long term (current) drug therapy: Secondary | ICD-10-CM | POA: Diagnosis not present

## 2020-02-25 DIAGNOSIS — M4716 Other spondylosis with myelopathy, lumbar region: Secondary | ICD-10-CM | POA: Diagnosis not present

## 2020-02-25 DIAGNOSIS — M545 Low back pain: Secondary | ICD-10-CM | POA: Diagnosis not present

## 2020-02-25 DIAGNOSIS — G894 Chronic pain syndrome: Secondary | ICD-10-CM | POA: Diagnosis not present

## 2020-02-25 DIAGNOSIS — Z79891 Long term (current) use of opiate analgesic: Secondary | ICD-10-CM | POA: Diagnosis not present

## 2020-03-13 DIAGNOSIS — M47816 Spondylosis without myelopathy or radiculopathy, lumbar region: Secondary | ICD-10-CM | POA: Diagnosis not present

## 2020-03-14 DIAGNOSIS — H04123 Dry eye syndrome of bilateral lacrimal glands: Secondary | ICD-10-CM | POA: Diagnosis not present

## 2020-03-14 DIAGNOSIS — H40033 Anatomical narrow angle, bilateral: Secondary | ICD-10-CM | POA: Diagnosis not present

## 2020-03-15 DIAGNOSIS — Z0100 Encounter for examination of eyes and vision without abnormal findings: Secondary | ICD-10-CM | POA: Diagnosis not present

## 2020-03-17 DIAGNOSIS — E785 Hyperlipidemia, unspecified: Secondary | ICD-10-CM | POA: Diagnosis not present

## 2020-03-17 DIAGNOSIS — R7309 Other abnormal glucose: Secondary | ICD-10-CM | POA: Diagnosis not present

## 2020-03-17 DIAGNOSIS — I10 Essential (primary) hypertension: Secondary | ICD-10-CM | POA: Diagnosis not present

## 2020-03-17 DIAGNOSIS — G894 Chronic pain syndrome: Secondary | ICD-10-CM | POA: Diagnosis not present

## 2020-03-30 DIAGNOSIS — N390 Urinary tract infection, site not specified: Secondary | ICD-10-CM | POA: Diagnosis not present

## 2020-03-31 DIAGNOSIS — M4716 Other spondylosis with myelopathy, lumbar region: Secondary | ICD-10-CM | POA: Diagnosis not present

## 2020-03-31 DIAGNOSIS — Z6829 Body mass index (BMI) 29.0-29.9, adult: Secondary | ICD-10-CM | POA: Diagnosis not present

## 2020-03-31 DIAGNOSIS — G894 Chronic pain syndrome: Secondary | ICD-10-CM | POA: Diagnosis not present

## 2020-04-28 DIAGNOSIS — G894 Chronic pain syndrome: Secondary | ICD-10-CM | POA: Diagnosis not present

## 2020-04-28 DIAGNOSIS — Z6829 Body mass index (BMI) 29.0-29.9, adult: Secondary | ICD-10-CM | POA: Diagnosis not present

## 2020-04-28 DIAGNOSIS — M4716 Other spondylosis with myelopathy, lumbar region: Secondary | ICD-10-CM | POA: Diagnosis not present

## 2020-05-30 DIAGNOSIS — M4716 Other spondylosis with myelopathy, lumbar region: Secondary | ICD-10-CM | POA: Diagnosis not present

## 2020-05-30 DIAGNOSIS — M5106 Intervertebral disc disorders with myelopathy, lumbar region: Secondary | ICD-10-CM | POA: Diagnosis not present

## 2020-05-30 DIAGNOSIS — G894 Chronic pain syndrome: Secondary | ICD-10-CM | POA: Diagnosis not present

## 2020-06-16 DIAGNOSIS — M5106 Intervertebral disc disorders with myelopathy, lumbar region: Secondary | ICD-10-CM | POA: Diagnosis not present

## 2020-06-16 DIAGNOSIS — M4716 Other spondylosis with myelopathy, lumbar region: Secondary | ICD-10-CM | POA: Diagnosis not present

## 2020-06-16 DIAGNOSIS — G894 Chronic pain syndrome: Secondary | ICD-10-CM | POA: Diagnosis not present

## 2020-06-26 DIAGNOSIS — N898 Other specified noninflammatory disorders of vagina: Secondary | ICD-10-CM | POA: Diagnosis not present

## 2020-06-26 DIAGNOSIS — D259 Leiomyoma of uterus, unspecified: Secondary | ICD-10-CM | POA: Diagnosis not present

## 2020-06-26 DIAGNOSIS — R102 Pelvic and perineal pain: Secondary | ICD-10-CM | POA: Diagnosis not present

## 2020-06-26 DIAGNOSIS — Z113 Encounter for screening for infections with a predominantly sexual mode of transmission: Secondary | ICD-10-CM | POA: Diagnosis not present

## 2020-06-26 DIAGNOSIS — N760 Acute vaginitis: Secondary | ICD-10-CM | POA: Diagnosis not present

## 2020-07-25 DIAGNOSIS — M5106 Intervertebral disc disorders with myelopathy, lumbar region: Secondary | ICD-10-CM | POA: Diagnosis not present

## 2020-07-25 DIAGNOSIS — M4716 Other spondylosis with myelopathy, lumbar region: Secondary | ICD-10-CM | POA: Diagnosis not present

## 2020-07-25 DIAGNOSIS — G894 Chronic pain syndrome: Secondary | ICD-10-CM | POA: Diagnosis not present

## 2020-07-26 ENCOUNTER — Other Ambulatory Visit: Payer: Self-pay | Admitting: Orthopaedic Surgery

## 2020-07-26 DIAGNOSIS — M4716 Other spondylosis with myelopathy, lumbar region: Secondary | ICD-10-CM

## 2020-08-10 DIAGNOSIS — Z20822 Contact with and (suspected) exposure to covid-19: Secondary | ICD-10-CM | POA: Diagnosis not present

## 2020-08-13 ENCOUNTER — Ambulatory Visit
Admission: RE | Admit: 2020-08-13 | Discharge: 2020-08-13 | Disposition: A | Discharge status: Home / Self Care | Payer: Medicare HMO | Source: Ambulatory Visit | Attending: Orthopaedic Surgery | Admitting: Orthopaedic Surgery

## 2020-08-13 ENCOUNTER — Other Ambulatory Visit: Payer: Self-pay

## 2020-08-13 DIAGNOSIS — M4316 Spondylolisthesis, lumbar region: Secondary | ICD-10-CM | POA: Diagnosis not present

## 2020-08-13 DIAGNOSIS — M48061 Spinal stenosis, lumbar region without neurogenic claudication: Secondary | ICD-10-CM | POA: Diagnosis not present

## 2020-08-13 DIAGNOSIS — M47816 Spondylosis without myelopathy or radiculopathy, lumbar region: Secondary | ICD-10-CM | POA: Diagnosis not present

## 2020-08-13 DIAGNOSIS — M5126 Other intervertebral disc displacement, lumbar region: Secondary | ICD-10-CM | POA: Diagnosis not present

## 2020-08-13 DIAGNOSIS — M4716 Other spondylosis with myelopathy, lumbar region: Secondary | ICD-10-CM

## 2020-08-13 MED ORDER — GADOBENATE DIMEGLUMINE 529 MG/ML IV SOLN
15.0000 mL | Freq: Once | INTRAVENOUS | Status: AC | PRN
  Administered 2020-08-13: 15 mL via INTRAVENOUS

## 2020-08-16 DIAGNOSIS — B029 Zoster without complications: Secondary | ICD-10-CM | POA: Diagnosis not present

## 2020-08-22 DIAGNOSIS — Z6829 Body mass index (BMI) 29.0-29.9, adult: Secondary | ICD-10-CM | POA: Diagnosis not present

## 2020-08-22 DIAGNOSIS — M5106 Intervertebral disc disorders with myelopathy, lumbar region: Secondary | ICD-10-CM | POA: Diagnosis not present

## 2020-08-22 DIAGNOSIS — Z79891 Long term (current) use of opiate analgesic: Secondary | ICD-10-CM | POA: Diagnosis not present

## 2020-08-22 DIAGNOSIS — M4716 Other spondylosis with myelopathy, lumbar region: Secondary | ICD-10-CM | POA: Diagnosis not present

## 2020-08-22 DIAGNOSIS — Z79899 Other long term (current) drug therapy: Secondary | ICD-10-CM | POA: Diagnosis not present

## 2020-08-22 DIAGNOSIS — G894 Chronic pain syndrome: Secondary | ICD-10-CM | POA: Diagnosis not present

## 2020-08-30 DIAGNOSIS — Z01812 Encounter for preprocedural laboratory examination: Secondary | ICD-10-CM | POA: Diagnosis not present

## 2020-09-04 DIAGNOSIS — K648 Other hemorrhoids: Secondary | ICD-10-CM | POA: Diagnosis not present

## 2020-09-04 DIAGNOSIS — D125 Benign neoplasm of sigmoid colon: Secondary | ICD-10-CM | POA: Diagnosis not present

## 2020-09-04 DIAGNOSIS — K5730 Diverticulosis of large intestine without perforation or abscess without bleeding: Secondary | ICD-10-CM | POA: Diagnosis not present

## 2020-09-04 DIAGNOSIS — Z1211 Encounter for screening for malignant neoplasm of colon: Secondary | ICD-10-CM | POA: Diagnosis not present

## 2020-09-04 DIAGNOSIS — D122 Benign neoplasm of ascending colon: Secondary | ICD-10-CM | POA: Diagnosis not present

## 2020-09-08 DIAGNOSIS — D122 Benign neoplasm of ascending colon: Secondary | ICD-10-CM | POA: Diagnosis not present

## 2020-09-08 DIAGNOSIS — D125 Benign neoplasm of sigmoid colon: Secondary | ICD-10-CM | POA: Diagnosis not present

## 2020-09-14 DIAGNOSIS — M4716 Other spondylosis with myelopathy, lumbar region: Secondary | ICD-10-CM | POA: Diagnosis not present

## 2020-09-14 DIAGNOSIS — G894 Chronic pain syndrome: Secondary | ICD-10-CM | POA: Diagnosis not present

## 2020-09-14 DIAGNOSIS — M5416 Radiculopathy, lumbar region: Secondary | ICD-10-CM | POA: Diagnosis not present

## 2020-09-14 DIAGNOSIS — M5106 Intervertebral disc disorders with myelopathy, lumbar region: Secondary | ICD-10-CM | POA: Diagnosis not present

## 2020-09-14 DIAGNOSIS — Z6829 Body mass index (BMI) 29.0-29.9, adult: Secondary | ICD-10-CM | POA: Diagnosis not present

## 2020-09-20 DIAGNOSIS — M5116 Intervertebral disc disorders with radiculopathy, lumbar region: Secondary | ICD-10-CM | POA: Diagnosis not present

## 2020-09-25 DIAGNOSIS — Z1231 Encounter for screening mammogram for malignant neoplasm of breast: Secondary | ICD-10-CM | POA: Diagnosis not present

## 2020-09-25 DIAGNOSIS — Z124 Encounter for screening for malignant neoplasm of cervix: Secondary | ICD-10-CM | POA: Diagnosis not present

## 2020-09-25 DIAGNOSIS — Z6830 Body mass index (BMI) 30.0-30.9, adult: Secondary | ICD-10-CM | POA: Diagnosis not present

## 2020-10-02 DIAGNOSIS — M5116 Intervertebral disc disorders with radiculopathy, lumbar region: Secondary | ICD-10-CM | POA: Diagnosis not present

## 2020-10-02 DIAGNOSIS — Z6829 Body mass index (BMI) 29.0-29.9, adult: Secondary | ICD-10-CM | POA: Diagnosis not present

## 2020-10-02 DIAGNOSIS — G894 Chronic pain syndrome: Secondary | ICD-10-CM | POA: Diagnosis not present

## 2020-10-02 DIAGNOSIS — M4716 Other spondylosis with myelopathy, lumbar region: Secondary | ICD-10-CM | POA: Diagnosis not present

## 2020-10-09 DIAGNOSIS — N958 Other specified menopausal and perimenopausal disorders: Secondary | ICD-10-CM | POA: Diagnosis not present

## 2020-10-24 DIAGNOSIS — M48062 Spinal stenosis, lumbar region with neurogenic claudication: Secondary | ICD-10-CM | POA: Diagnosis not present

## 2020-10-24 DIAGNOSIS — Z6829 Body mass index (BMI) 29.0-29.9, adult: Secondary | ICD-10-CM | POA: Diagnosis not present

## 2020-10-24 DIAGNOSIS — M5116 Intervertebral disc disorders with radiculopathy, lumbar region: Secondary | ICD-10-CM | POA: Diagnosis not present

## 2020-10-24 DIAGNOSIS — M4716 Other spondylosis with myelopathy, lumbar region: Secondary | ICD-10-CM | POA: Diagnosis not present

## 2020-10-27 ENCOUNTER — Ambulatory Visit (HOSPITAL_COMMUNITY)
Admission: EM | Admit: 2020-10-27 | Discharge: 2020-10-27 | Disposition: A | Discharge status: Home / Self Care | Payer: Medicare HMO | Attending: Emergency Medicine | Admitting: Emergency Medicine

## 2020-10-27 ENCOUNTER — Encounter (HOSPITAL_COMMUNITY): Payer: Self-pay

## 2020-10-27 ENCOUNTER — Ambulatory Visit (INDEPENDENT_AMBULATORY_CARE_PROVIDER_SITE_OTHER): Payer: Medicare HMO

## 2020-10-27 ENCOUNTER — Other Ambulatory Visit: Payer: Self-pay

## 2020-10-27 DIAGNOSIS — M25442 Effusion, left hand: Secondary | ICD-10-CM | POA: Diagnosis not present

## 2020-10-27 DIAGNOSIS — M79642 Pain in left hand: Secondary | ICD-10-CM | POA: Diagnosis not present

## 2020-10-27 DIAGNOSIS — M7989 Other specified soft tissue disorders: Secondary | ICD-10-CM

## 2020-10-27 MED ORDER — METHYLPREDNISOLONE ACETATE 80 MG/ML IJ SUSP
INTRAMUSCULAR | Status: AC
  Filled 2020-10-27: qty 1

## 2020-10-27 MED ORDER — METHYLPREDNISOLONE ACETATE 40 MG/ML IJ SUSP
INTRAMUSCULAR | Status: AC
  Filled 2020-10-27: qty 1

## 2020-10-27 MED ORDER — METHYLPREDNISOLONE ACETATE 80 MG/ML IJ SUSP
80.0000 mg | Freq: Once | INTRAMUSCULAR | Status: AC
  Administered 2020-10-27: 80 mg via INTRAMUSCULAR

## 2020-10-27 MED ORDER — INDOMETHACIN 50 MG PO CAPS: ORAL_CAPSULE | ORAL | 0 refills | Status: DC
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
## 2020-10-27 NOTE — ED Triage Notes (Signed)
Pt presents with left hand pain & swelling X 2 days non injury related.

## 2020-10-27 NOTE — Discharge Instructions (Signed)
Your xray looks well today which is reassuring.  I feel your symptoms and exam is consistent with gout.  Increase your water intake.  Stop taking ibuprofen and use indomethacin as prescribed.  Ice, elevate, to help with pain.  If symptoms worsen or do not improve in the next week to return to be seen or to follow up with your PCP.

## 2020-10-30 DIAGNOSIS — I10 Essential (primary) hypertension: Secondary | ICD-10-CM | POA: Diagnosis not present

## 2020-11-17 DIAGNOSIS — E785 Hyperlipidemia, unspecified: Secondary | ICD-10-CM | POA: Diagnosis not present

## 2020-11-17 DIAGNOSIS — I10 Essential (primary) hypertension: Secondary | ICD-10-CM | POA: Diagnosis not present

## 2020-11-17 DIAGNOSIS — R7303 Prediabetes: Secondary | ICD-10-CM | POA: Diagnosis not present

## 2020-11-17 DIAGNOSIS — Z0000 Encounter for general adult medical examination without abnormal findings: Secondary | ICD-10-CM | POA: Diagnosis not present

## 2020-11-17 DIAGNOSIS — Z1389 Encounter for screening for other disorder: Secondary | ICD-10-CM | POA: Diagnosis not present

## 2020-11-17 DIAGNOSIS — G894 Chronic pain syndrome: Secondary | ICD-10-CM | POA: Diagnosis not present

## 2020-11-17 DIAGNOSIS — M48061 Spinal stenosis, lumbar region without neurogenic claudication: Secondary | ICD-10-CM | POA: Diagnosis not present

## 2020-11-30 DIAGNOSIS — M5116 Intervertebral disc disorders with radiculopathy, lumbar region: Secondary | ICD-10-CM | POA: Diagnosis not present

## 2020-11-30 DIAGNOSIS — M4716 Other spondylosis with myelopathy, lumbar region: Secondary | ICD-10-CM | POA: Diagnosis not present

## 2020-11-30 DIAGNOSIS — M48062 Spinal stenosis, lumbar region with neurogenic claudication: Secondary | ICD-10-CM | POA: Diagnosis not present

## 2020-12-27 DIAGNOSIS — M5106 Intervertebral disc disorders with myelopathy, lumbar region: Secondary | ICD-10-CM | POA: Diagnosis not present

## 2020-12-27 DIAGNOSIS — M5116 Intervertebral disc disorders with radiculopathy, lumbar region: Secondary | ICD-10-CM | POA: Diagnosis not present

## 2020-12-27 DIAGNOSIS — M48062 Spinal stenosis, lumbar region with neurogenic claudication: Secondary | ICD-10-CM | POA: Diagnosis not present

## 2020-12-27 DIAGNOSIS — M4716 Other spondylosis with myelopathy, lumbar region: Secondary | ICD-10-CM | POA: Diagnosis not present

## 2021-01-03 DIAGNOSIS — L821 Other seborrheic keratosis: Secondary | ICD-10-CM | POA: Diagnosis not present

## 2021-01-09 DIAGNOSIS — M5116 Intervertebral disc disorders with radiculopathy, lumbar region: Secondary | ICD-10-CM | POA: Diagnosis not present

## 2021-01-31 DIAGNOSIS — M48062 Spinal stenosis, lumbar region with neurogenic claudication: Secondary | ICD-10-CM | POA: Diagnosis not present

## 2021-01-31 DIAGNOSIS — Z79891 Long term (current) use of opiate analgesic: Secondary | ICD-10-CM | POA: Diagnosis not present

## 2021-01-31 DIAGNOSIS — M5116 Intervertebral disc disorders with radiculopathy, lumbar region: Secondary | ICD-10-CM | POA: Diagnosis not present

## 2021-01-31 DIAGNOSIS — G894 Chronic pain syndrome: Secondary | ICD-10-CM | POA: Diagnosis not present

## 2021-01-31 DIAGNOSIS — Z79899 Other long term (current) drug therapy: Secondary | ICD-10-CM | POA: Diagnosis not present

## 2021-02-28 DIAGNOSIS — M48062 Spinal stenosis, lumbar region with neurogenic claudication: Secondary | ICD-10-CM | POA: Diagnosis not present

## 2021-02-28 DIAGNOSIS — M5116 Intervertebral disc disorders with radiculopathy, lumbar region: Secondary | ICD-10-CM | POA: Diagnosis not present

## 2021-02-28 DIAGNOSIS — Z6829 Body mass index (BMI) 29.0-29.9, adult: Secondary | ICD-10-CM | POA: Diagnosis not present

## 2021-02-28 DIAGNOSIS — M5106 Intervertebral disc disorders with myelopathy, lumbar region: Secondary | ICD-10-CM | POA: Diagnosis not present

## 2021-03-13 DIAGNOSIS — R509 Fever, unspecified: Secondary | ICD-10-CM | POA: Diagnosis not present

## 2021-03-13 DIAGNOSIS — R5383 Other fatigue: Secondary | ICD-10-CM | POA: Diagnosis not present

## 2021-03-13 DIAGNOSIS — R059 Cough, unspecified: Secondary | ICD-10-CM | POA: Diagnosis not present

## 2021-03-13 DIAGNOSIS — U071 COVID-19: Secondary | ICD-10-CM | POA: Diagnosis not present

## 2021-03-28 DIAGNOSIS — M5106 Intervertebral disc disorders with myelopathy, lumbar region: Secondary | ICD-10-CM | POA: Diagnosis not present

## 2021-03-28 DIAGNOSIS — M5116 Intervertebral disc disorders with radiculopathy, lumbar region: Secondary | ICD-10-CM | POA: Diagnosis not present

## 2021-03-28 DIAGNOSIS — M48062 Spinal stenosis, lumbar region with neurogenic claudication: Secondary | ICD-10-CM | POA: Diagnosis not present

## 2021-04-24 DIAGNOSIS — M5116 Intervertebral disc disorders with radiculopathy, lumbar region: Secondary | ICD-10-CM | POA: Diagnosis not present

## 2021-05-02 DIAGNOSIS — M5116 Intervertebral disc disorders with radiculopathy, lumbar region: Secondary | ICD-10-CM | POA: Diagnosis not present

## 2021-05-02 DIAGNOSIS — M5106 Intervertebral disc disorders with myelopathy, lumbar region: Secondary | ICD-10-CM | POA: Diagnosis not present

## 2021-05-02 DIAGNOSIS — M48062 Spinal stenosis, lumbar region with neurogenic claudication: Secondary | ICD-10-CM | POA: Diagnosis not present

## 2021-05-30 DIAGNOSIS — M48062 Spinal stenosis, lumbar region with neurogenic claudication: Secondary | ICD-10-CM | POA: Diagnosis not present

## 2021-05-30 DIAGNOSIS — M5116 Intervertebral disc disorders with radiculopathy, lumbar region: Secondary | ICD-10-CM | POA: Diagnosis not present

## 2021-05-30 DIAGNOSIS — M5106 Intervertebral disc disorders with myelopathy, lumbar region: Secondary | ICD-10-CM | POA: Diagnosis not present

## 2021-06-07 ENCOUNTER — Other Ambulatory Visit: Payer: Self-pay

## 2021-06-07 ENCOUNTER — Encounter (HOSPITAL_COMMUNITY): Payer: Self-pay | Admitting: Emergency Medicine

## 2021-06-07 ENCOUNTER — Ambulatory Visit (HOSPITAL_COMMUNITY)
Admission: EM | Admit: 2021-06-07 | Discharge: 2021-06-07 | Disposition: A | Discharge status: Home / Self Care | Payer: Medicare HMO | Attending: Emergency Medicine | Admitting: Emergency Medicine

## 2021-06-07 DIAGNOSIS — H00011 Hordeolum externum right upper eyelid: Secondary | ICD-10-CM | POA: Diagnosis not present

## 2021-06-07 MED ORDER — TOBRAMYCIN 0.3 % OP SOLN: 2.0000 [drp] | OPHTHALMIC | 0 refills | Status: DC

## 2021-06-07 MED ORDER — AMOXICILLIN-POT CLAVULANATE 500-125 MG PO TABS: 1.0000 | ORAL_TABLET | Freq: Three times a day (TID) | ORAL | 0 refills | Status: DC

## 2021-06-07 NOTE — Discharge Instructions (Addendum)
Continue warm compresses several times a day using a clean washcloth each time.   See the ophthalmologist if this treatment doesn't heal your stye or if it worsens.

## 2021-06-07 NOTE — ED Triage Notes (Signed)
Pt is present today with right eye pain/swelling, vomiting, HA, and fatigue. Pt sx started x2 weeks ago.
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
## 2021-07-03 DIAGNOSIS — M5106 Intervertebral disc disorders with myelopathy, lumbar region: Secondary | ICD-10-CM | POA: Diagnosis not present

## 2021-07-03 DIAGNOSIS — M48062 Spinal stenosis, lumbar region with neurogenic claudication: Secondary | ICD-10-CM | POA: Diagnosis not present

## 2021-07-03 DIAGNOSIS — M5116 Intervertebral disc disorders with radiculopathy, lumbar region: Secondary | ICD-10-CM | POA: Diagnosis not present

## 2021-07-04 DIAGNOSIS — H0011 Chalazion right upper eyelid: Secondary | ICD-10-CM | POA: Diagnosis not present

## 2021-07-04 DIAGNOSIS — H0288A Meibomian gland dysfunction right eye, upper and lower eyelids: Secondary | ICD-10-CM | POA: Diagnosis not present

## 2021-07-04 DIAGNOSIS — H0288B Meibomian gland dysfunction left eye, upper and lower eyelids: Secondary | ICD-10-CM | POA: Diagnosis not present

## 2021-07-10 DIAGNOSIS — H0011 Chalazion right upper eyelid: Secondary | ICD-10-CM | POA: Diagnosis not present

## 2021-07-14 ENCOUNTER — Emergency Department (HOSPITAL_COMMUNITY): Payer: Medicare HMO

## 2021-07-14 ENCOUNTER — Encounter (HOSPITAL_COMMUNITY): Payer: Self-pay | Admitting: Emergency Medicine

## 2021-07-14 ENCOUNTER — Other Ambulatory Visit: Payer: Self-pay

## 2021-07-14 ENCOUNTER — Observation Stay (HOSPITAL_COMMUNITY): Payer: Medicare HMO

## 2021-07-14 ENCOUNTER — Observation Stay (HOSPITAL_COMMUNITY)
Admission: EM | Admit: 2021-07-14 | Discharge: 2021-07-16 | Disposition: A | Discharge status: Home / Self Care | Payer: Medicare HMO | Attending: Internal Medicine | Admitting: Internal Medicine

## 2021-07-14 DIAGNOSIS — I208 Other forms of angina pectoris: Secondary | ICD-10-CM | POA: Diagnosis not present

## 2021-07-14 DIAGNOSIS — R079 Chest pain, unspecified: Secondary | ICD-10-CM | POA: Diagnosis not present

## 2021-07-14 DIAGNOSIS — F419 Anxiety disorder, unspecified: Secondary | ICD-10-CM | POA: Diagnosis present

## 2021-07-14 DIAGNOSIS — Z20822 Contact with and (suspected) exposure to covid-19: Secondary | ICD-10-CM | POA: Insufficient documentation

## 2021-07-14 DIAGNOSIS — I209 Angina pectoris, unspecified: Secondary | ICD-10-CM | POA: Diagnosis not present

## 2021-07-14 DIAGNOSIS — R0602 Shortness of breath: Secondary | ICD-10-CM | POA: Diagnosis not present

## 2021-07-14 DIAGNOSIS — E785 Hyperlipidemia, unspecified: Secondary | ICD-10-CM | POA: Diagnosis present

## 2021-07-14 DIAGNOSIS — F32A Depression, unspecified: Secondary | ICD-10-CM | POA: Diagnosis present

## 2021-07-14 DIAGNOSIS — Z79899 Other long term (current) drug therapy: Secondary | ICD-10-CM | POA: Insufficient documentation

## 2021-07-14 DIAGNOSIS — M5410 Radiculopathy, site unspecified: Secondary | ICD-10-CM | POA: Diagnosis present

## 2021-07-14 DIAGNOSIS — I2089 Other forms of angina pectoris: Secondary | ICD-10-CM

## 2021-07-14 DIAGNOSIS — I10 Essential (primary) hypertension: Secondary | ICD-10-CM | POA: Diagnosis not present

## 2021-07-14 DIAGNOSIS — R0789 Other chest pain: Secondary | ICD-10-CM | POA: Diagnosis present

## 2021-07-14 LAB — I-STAT BETA HCG BLOOD, ED (MC, WL, AP ONLY): I-stat hCG, quantitative: 5.2 m[IU]/mL — ABNORMAL HIGH (ref <5)

## 2021-07-14 LAB — TROPONIN I (HIGH SENSITIVITY)
Troponin I (High Sensitivity): 8 ng/L (ref <18)
Troponin I (High Sensitivity): 6 ng/L (ref <18)

## 2021-07-14 LAB — BASIC METABOLIC PANEL
Anion gap: 10 (ref 5–15)
BUN: 21 mg/dL — ABNORMAL HIGH (ref 6–20)
CO2: 25 mmol/L (ref 22–32)
Calcium: 9.4 mg/dL (ref 8.9–10.3)
Chloride: 104 mmol/L (ref 98–111)
Creatinine, Ser: 1.17 mg/dL — ABNORMAL HIGH (ref 0.44–1.00)
GFR, Estimated: 56 mL/min — ABNORMAL LOW (ref >60)
Glucose, Bld: 126 mg/dL — ABNORMAL HIGH (ref 70–99)
Potassium: 3.4 mmol/L — ABNORMAL LOW (ref 3.5–5.1)
Sodium: 139 mmol/L (ref 135–145)

## 2021-07-14 LAB — CBC
HCT: 43 % (ref 36.0–46.0)
Hemoglobin: 14.1 g/dL (ref 12.0–15.0)
MCH: 28 pg (ref 26.0–34.0)
MCHC: 32.8 g/dL (ref 30.0–36.0)
MCV: 85.5 fL (ref 80.0–100.0)
Platelets: 258 10*3/uL (ref 150–400)
RBC: 5.03 MIL/uL (ref 3.87–5.11)
RDW: 12.6 % (ref 11.5–15.5)
WBC: 8.6 10*3/uL (ref 4.0–10.5)
nRBC: 0 % (ref 0.0–0.2)

## 2021-07-14 LAB — PROTIME-INR
INR: 0.9 (ref 0.8–1.2)
Prothrombin Time: 11.8 seconds (ref 11.4–15.2)

## 2021-07-14 LAB — HIV ANTIBODY (ROUTINE TESTING W REFLEX): HIV Screen 4th Generation wRfx: NONREACTIVE

## 2021-07-14 LAB — RESP PANEL BY RT-PCR (FLU A&B, COVID) ARPGX2
Influenza A by PCR: NEGATIVE
Influenza B by PCR: NEGATIVE
SARS Coronavirus 2 by RT PCR: NEGATIVE

## 2021-07-14 LAB — HEMOGLOBIN A1C
Hgb A1c MFr Bld: 6.1 % — ABNORMAL HIGH (ref 4.8–5.6)
Mean Plasma Glucose: 128.37 mg/dL

## 2021-07-14 MED ORDER — FENTANYL 12 MCG/HR TD PT72
1.0000 | MEDICATED_PATCH | TRANSDERMAL | Status: DC
  Administered 2021-07-14: 1 via TRANSDERMAL
  Filled 2021-07-14: qty 1

## 2021-07-14 MED ORDER — ALUM & MAG HYDROXIDE-SIMETH 200-200-20 MG/5ML PO SUSP
30.0000 mL | Freq: Once | ORAL | Status: AC
  Administered 2021-07-15: 30 mL via ORAL
  Filled 2021-07-14: qty 30

## 2021-07-14 MED ORDER — TIZANIDINE HCL 4 MG PO TABS
2.0000 mg | ORAL_TABLET | Freq: Four times a day (QID) | ORAL | Status: DC | PRN
  Administered 2021-07-14 – 2021-07-16 (×2): 2 mg via ORAL
  Filled 2021-07-14 (×3): qty 1

## 2021-07-14 MED ORDER — ZOLPIDEM TARTRATE 5 MG PO TABS
5.0000 mg | ORAL_TABLET | Freq: Every day | ORAL | Status: DC
Start: 2021-07-14 — End: 2021-07-16
  Administered 2021-07-14 – 2021-07-15 (×2): 5 mg via ORAL
  Filled 2021-07-14 (×2): qty 1

## 2021-07-14 MED ORDER — ONDANSETRON HCL 4 MG/2ML IJ SOLN: 4.0000 mg | Freq: Four times a day (QID) | INTRAMUSCULAR | Status: DC | PRN

## 2021-07-14 MED ORDER — NITROGLYCERIN 0.4 MG SL SUBL
0.4000 mg | SUBLINGUAL_TABLET | SUBLINGUAL | Status: DC | PRN
  Administered 2021-07-14: 0.4 mg via SUBLINGUAL
  Filled 2021-07-14: qty 1

## 2021-07-14 MED ORDER — ADULT MULTIVITAMIN W/MINERALS CH
1.0000 | ORAL_TABLET | Freq: Every day | ORAL | Status: DC
  Filled 2021-07-14: qty 1

## 2021-07-14 MED ORDER — GABAPENTIN 300 MG PO CAPS
300.0000 mg | ORAL_CAPSULE | Freq: Three times a day (TID) | ORAL | Status: DC
  Administered 2021-07-14 – 2021-07-16 (×6): 300 mg via ORAL
  Filled 2021-07-14 (×6): qty 1

## 2021-07-14 MED ORDER — LISINOPRIL-HYDROCHLOROTHIAZIDE 10-12.5 MG PO TABS: 1.0000 | ORAL_TABLET | Freq: Every day | ORAL | Status: DC

## 2021-07-14 MED ORDER — LISINOPRIL 10 MG PO TABS
10.0000 mg | ORAL_TABLET | Freq: Every day | ORAL | Status: DC
  Administered 2021-07-14 – 2021-07-16 (×3): 10 mg via ORAL
  Filled 2021-07-14 (×3): qty 1

## 2021-07-14 MED ORDER — POTASSIUM CHLORIDE CRYS ER 20 MEQ PO TBCR
40.0000 meq | EXTENDED_RELEASE_TABLET | Freq: Once | ORAL | Status: AC
  Administered 2021-07-14: 40 meq via ORAL
  Filled 2021-07-14: qty 2

## 2021-07-14 MED ORDER — ENOXAPARIN SODIUM 40 MG/0.4ML IJ SOSY
40.0000 mg | PREFILLED_SYRINGE | INTRAMUSCULAR | Status: DC
  Administered 2021-07-14 – 2021-07-15 (×2): 40 mg via SUBCUTANEOUS
  Filled 2021-07-14 (×2): qty 0.4

## 2021-07-14 MED ORDER — ADULT MULTIVITAMIN W/MINERALS CH
1.0000 | ORAL_TABLET | Freq: Every day | ORAL | Status: DC
  Administered 2021-07-14 – 2021-07-16 (×3): 1 via ORAL
  Filled 2021-07-14 (×3): qty 1

## 2021-07-14 MED ORDER — OXYCODONE-ACETAMINOPHEN 5-325 MG PO TABS
2.0000 | ORAL_TABLET | Freq: Four times a day (QID) | ORAL | Status: DC | PRN
Start: 2021-07-14 — End: 2021-07-14
  Filled 2021-07-14: qty 2

## 2021-07-14 MED ORDER — HYDROCHLOROTHIAZIDE 12.5 MG PO TABS
12.5000 mg | ORAL_TABLET | Freq: Every day | ORAL | Status: DC
  Administered 2021-07-14 – 2021-07-16 (×3): 12.5 mg via ORAL
  Filled 2021-07-14 (×3): qty 1

## 2021-07-14 MED ORDER — SODIUM CHLORIDE 0.9 % IV BOLUS
1000.0000 mL | Freq: Once | INTRAVENOUS | Status: AC
  Administered 2021-07-14: 1000 mL via INTRAVENOUS

## 2021-07-14 MED ORDER — ACETAMINOPHEN 325 MG PO TABS: 650.0000 mg | ORAL_TABLET | ORAL | Status: DC | PRN

## 2021-07-14 MED ORDER — NEOMYCIN-POLYMYXIN-DEXAMETH 3.5-10000-0.1 OP OINT
1.0000 "application " | TOPICAL_OINTMENT | Freq: Three times a day (TID) | OPHTHALMIC | Status: DC
  Administered 2021-07-14 – 2021-07-15 (×5): 1 via OPHTHALMIC
  Filled 2021-07-14: qty 3.5

## 2021-07-14 MED ORDER — OXYCODONE-ACETAMINOPHEN 5-325 MG PO TABS
1.0000 | ORAL_TABLET | Freq: Four times a day (QID) | ORAL | Status: DC | PRN
  Administered 2021-07-14 – 2021-07-15 (×3): 1 via ORAL
  Filled 2021-07-14 (×3): qty 1

## 2021-07-14 MED ORDER — ONDANSETRON HCL 4 MG/2ML IJ SOLN
4.0000 mg | Freq: Once | INTRAMUSCULAR | Status: AC
  Administered 2021-07-14: 4 mg via INTRAVENOUS
  Filled 2021-07-14: qty 2

## 2021-07-14 MED ORDER — MORPHINE SULFATE (PF) 4 MG/ML IV SOLN: 4.0000 mg | Freq: Once | INTRAVENOUS | Status: DC

## 2021-07-14 MED ORDER — ATORVASTATIN CALCIUM 40 MG PO TABS
40.0000 mg | ORAL_TABLET | Freq: Every day | ORAL | Status: DC
  Administered 2021-07-14 – 2021-07-15 (×2): 40 mg via ORAL
  Filled 2021-07-14 (×2): qty 1

## 2021-07-14 MED ORDER — ASPIRIN 81 MG PO CHEW
324.0000 mg | CHEWABLE_TABLET | Freq: Once | ORAL | Status: AC
  Administered 2021-07-14: 324 mg via ORAL
  Filled 2021-07-14: qty 4

## 2021-07-14 MED ORDER — ASPIRIN EC 81 MG PO TBEC
81.0000 mg | DELAYED_RELEASE_TABLET | Freq: Every day | ORAL | Status: DC
  Administered 2021-07-14 – 2021-07-16 (×3): 81 mg via ORAL
  Filled 2021-07-14 (×3): qty 1

## 2021-07-14 MED ORDER — LIDOCAINE-PRILOCAINE 2.5-2.5 % EX CREA
4.0000 "application " | TOPICAL_CREAM | Freq: Four times a day (QID) | CUTANEOUS | Status: DC
  Administered 2021-07-14 – 2021-07-15 (×6): 4 via TOPICAL
  Administered 2021-07-16: 5 via TOPICAL
  Administered 2021-07-16: 4 via TOPICAL
  Filled 2021-07-14 (×2): qty 5

## 2021-07-14 MED ORDER — DIPHENHYDRAMINE HCL 25 MG PO CAPS
25.0000 mg | ORAL_CAPSULE | Freq: Four times a day (QID) | ORAL | Status: DC | PRN
  Filled 2021-07-14: qty 1
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
## 2021-07-14 NOTE — ED Triage Notes (Signed)
Patient woke up this morning with pain across her chest radiating to left arm with mild SOB and nausea , denies emesis or diaphoresis , no cough or fever .

## 2021-07-14 NOTE — Progress Notes (Signed)
Patient states that she feels like she is having pressure in her chest area.  Patient states that she feels like her heart is quivering, or like her heart is skipping a beat or like her heart is turn over.  Patient states that she just got up to use the bathroom and came back to bed and she start having the above symptoms once she laid down.  Patient denies any dizziness or feeling lightheaded. She does state however that when she has this feeling that she feels a little out of breath. EKG taken, MD notified.  Will continue to monitor.     Donah Driver, RN

## 2021-07-14 NOTE — ED Provider Notes (Signed)
[facility] EMERGENCY DEPARTMENT Provider Note   CSN: [id number] Arrival date & time: 07/14/21  4098     History  Chief Complaint  Patient presents with   Chest Pain    Taylor Bowman is a 51 y.o. female.  The history is provided by the patient and medical records. No language interpreter was used.  Chest Pain  51 year old female with a pertinent medical history including diabetes, hypertension, hyperlipidemia, obesity, anxiety and depression, who presents for evaluation of chest pain.  Patient report around 6 AM this morning she was woke with pain in his chest.  She described pain as a pressure sensation to her mid chest, which radiates towards her jaw and her left arm.  She also felt nauseous, dizzy, and mild shortness of breath.  She denies any diaphoresis.  Symptom initially was more intense but has since improving a bit.  Currently rates her pain as 5 out of 10.  No specific treatment tried.  She denies fever chills productive cough dysuria or abdominal pain or back pain.  She has not had this pain in the past.  She denies alcohol or tobacco use or recreational drug use.  Her mother died from a sudden cardiac death at the age of 30.  She has a cardiac stress test 13 years ago and it was negative.  She does admits to increased life stress.  Home Medications Prior to Admission medications   Medication Sig Start Date End Date Taking? Authorizing Provider  amoxicillin-clavulanate (AUGMENTIN) 500-125 MG tablet Take 1 tablet (500 mg total) by mouth every 8 (eight) hours. 06/07/21   Carvel Getting, NP  atorvastatin (LIPITOR) 40 MG tablet Take 1 tablet (40 mg total) by mouth daily at 6 PM. 10/20/14   Rabbani, Ricarda Frame, MD  diphenhydrAMINE (BENADRYL) 25 MG tablet Take 25 mg by mouth every 6 (six) hours as needed for itching.    [provider]  fentaNYL (DURAGESIC - DOSED MCG/HR) 12 MCG/HR Place 12 mcg onto the skin every 3 (three) days.     [provider]   gabapentin (NEURONTIN) 300 MG capsule Take 300 mg by mouth 3 (three) times daily.    [provider]  indomethacin (INDOCIN) 50 MG capsule Take three time a day, with food, for the next 2 days, then decrease to twice a day until symptoms have resolved. 10/27/20   Augusto Gamble B, NP  lidocaine-prilocaine (EMLA) cream Apply 2-3 g topically 4 (four) times daily. To affected area. 02/02/18   [provider]  lisinopril-hydrochlorothiazide (PRINZIDE,ZESTORETIC) 10-12.5 MG per tablet Take 1 tablet by mouth daily.    [provider]  Multiple Vitamin (MULTIVITAMIN) tablet Take 1 tablet by mouth daily.    [provider]  oxyCODONE-acetaminophen (PERCOCET) 10-325 MG tablet Take 1 tablet by mouth every 6 (six) hours as needed for pain.  02/07/18   [provider]  tiZANidine (ZANAFLEX) 2 MG tablet Take 2 mg by mouth every 6 (six) hours as needed for muscle spasms.    [provider]  tobramycin (TOBREX) 0.3 % ophthalmic solution Place 2 drops into the right eye every 4 (four) hours. 06/07/21   Carvel Getting, NP  zolpidem (AMBIEN) 10 MG tablet Take 10 mg by mouth at bedtime.    [provider]      Allergies    Bee venom, Codeine, Demerol, and Morphine and related    Review of Systems   Review of Systems  Cardiovascular:  Positive for chest pain.  All other systems reviewed and are negative.  Physical Exam Updated Vital Signs BP (!) 151/103 (BP Location: Left Arm)    Pulse 65    Temp 97.9 F (36.6 C) (Oral)    Resp 20    Ht 5\' 4"  (1.626 m)    Wt 85 kg    SpO2 97%    BMI 32.17 kg/m  Physical Exam Vitals and nursing note reviewed.  Constitutional:      General: She is not in acute distress.    Appearance: She is well-developed.  HENT:     Head: Atraumatic.  Eyes:     Conjunctiva/sclera: Conjunctivae normal.  Cardiovascular:     Rate and Rhythm: Normal rate and regular rhythm.     Pulses: Normal pulses.     Heart sounds: Normal  heart sounds.  Pulmonary:     Effort: Pulmonary effort is normal.     Breath sounds: No wheezing, rhonchi or rales.  Abdominal:     Palpations: Abdomen is soft.     Tenderness: There is no abdominal tenderness.  Musculoskeletal:     Cervical back: Neck supple.     Right lower leg: No edema.     Left lower leg: No edema.  Skin:    Findings: No rash.  Neurological:     Mental Status: She is alert. Mental status is at baseline.  Psychiatric:        Mood and Affect: Mood normal.    ED Results / Procedures / Treatments   Labs (all labs ordered are listed, but only abnormal results are displayed) Labs Reviewed  BASIC METABOLIC PANEL - Abnormal; Notable for the following components:      Result Value   Potassium 3.4 (*)    Glucose, Bld 126 (*)    BUN 21 (*)    Creatinine, Ser 1.17 (*)    GFR, Estimated 56 (*)    All other components within normal limits  I-STAT BETA HCG BLOOD, ED ([facility], [facility], [facility] ONLY) - Abnormal; Notable for the following components:   I-stat hCG, quantitative 5.2 (*)    All other components within normal limits  RESP PANEL BY RT-PCR (FLU A&B, COVID) ARPGX2  CBC  PROTIME-INR  HIV ANTIBODY (ROUTINE TESTING W REFLEX)  HEMOGLOBIN A1C  TROPONIN I (HIGH SENSITIVITY)  TROPONIN I (HIGH SENSITIVITY)    EKG EKG Interpretation  Date/Time:  Saturday July 14 2021 10:06:49 EST Ventricular Rate:  60 PR Interval:  176 QRS Duration: 100 QT Interval:  431 QTC Calculation: 431 R Axis:   2 Text Interpretation: Sinus rhythm Left ventricular hypertrophy possible delta wave seen in II, V5 Confirmed by Kommor, Madison ([id number]) on 07/14/2021 10:10:20 AM  Radiology DG Chest 2 View  Result Date: 07/14/2021 CLINICAL DATA:  Chest pain EXAM: CHEST - 2 VIEW COMPARISON:  03/31/2018 FINDINGS: Normal heart size and mediastinal contours. No acute infiltrate or edema. No effusion or pneumothorax. No acute osseous findings. IMPRESSION: No active cardiopulmonary disease. Electronically  Signed   By: Jorje Guild M.D.   On: 07/14/2021 07:27    Procedures Procedures    Medications Ordered in ED Medications  nitroGLYCERIN (NITROSTAT) SL tablet 0.4 mg (0.4 mg Sublingual Given 07/14/21 1119)  fentaNYL (DURAGESIC) 12 MCG/HR 1 patch (has no administration in time range)  oxyCODONE-acetaminophen (PERCOCET) 10-325 MG per tablet 1 tablet (has no administration in time range)  atorvastatin (LIPITOR) tablet 40 mg (has no administration in time range)  lisinopril-hydrochlorothiazide (ZESTORETIC) 10-12.5 MG per tablet 1 tablet (has no  administration in time range)  zolpidem (AMBIEN) tablet 10 mg (has no administration in time range)  gabapentin (NEURONTIN) capsule 300 mg (has no administration in time range)  tiZANidine (ZANAFLEX) tablet 2 mg (has no administration in time range)  multivitamin tablet 1 tablet (has no administration in time range)  diphenhydrAMINE (BENADRYL) tablet 25 mg (has no administration in time range)  lidocaine-prilocaine (EMLA) cream 4-6 application (has no administration in time range)  acetaminophen (TYLENOL) tablet 650 mg (has no administration in time range)  ondansetron (ZOFRAN) injection 4 mg (has no administration in time range)  enoxaparin (LOVENOX) injection 40 mg (has no administration in time range)  aspirin EC tablet 81 mg (has no administration in time range)  potassium chloride SA (KLOR-CON M) CR tablet 40 mEq (has no administration in time range)  aspirin chewable tablet 324 mg (324 mg Oral Given 07/14/21 1036)  sodium chloride 0.9 % bolus 1,000 mL (0 mLs Intravenous Stopped 07/14/21 1158)  ondansetron (ZOFRAN) injection 4 mg (4 mg Intravenous Given 07/14/21 1117)    ED Course/ Medical Decision Making/ A&P                           Medical Decision Making  BP (!) 151/103 (BP Location: Left Arm)    Pulse 65    Temp 97.9 F (36.6 C) (Oral)    Resp 20    Ht 5\' 4"  (1.626 m)    Wt 85 kg    SpO2 97%    BMI 32.17 kg/m   9:53 AM This is a  51 year old female with pertinent past medical history which includes prediabetes, hypertension, obesity and anxiety who is here with complaints of chest pressure since earlier today.  Chest pain is nonexertional which makes it a bit less likely to be ACS however she does have cardiac risk factors as well as concerning chest pain warranting further work-up.  She does not have any infectious symptoms.  Her EKG obtained and reviewed and interpreted by me showing a normal sinus rhythm without acute ischemic changes.  Her labs obtained and reviewed by me are reassuring.  Normal initial troponin, chest x-ray unremarkable, no significant electrolyte derangement.  Mild AKI with a creatinine of 1.17 we will give IV fluid.  Differential include ACS, and based on her HEAR score she is of low to moderate risk.  Will obtain delta troponin.  I have considered PE however her presentation score low on the Wells criteria.  Do not think D-dimer is indicated at this time.  I have also considered anxiety, pericarditis, pneumonia, or MSK pain however these are less likely based on presenting complaints and physical examination.  Blood pressure shows hypertension with blood pressure of 151/103.  She is afebrile, and no hypoxia.  EKG was repeated with possible delta wave in lead II, V5 and V6.  Dr. Matilde Sprang is aware, cardiology was consulted, did reviewed EKG and felt that this is not consistent with a delta wave or signs of WPW  11:29 AM Patient reported complete resolutions of her chest pain after receiving sublingual nitro and aspirin.  Given her risk factor profile, plan to consult medicine for admission for chest pain rule out.  12:08 PM Appreciate consultation from [facility] Dr. Roosevelt Locks who agrees to see and will admit pt for cardiac rule out.  Pt is made aware and agrees with plan.    Presentation concerning for a cardiac source of chest pain including a cardiac ischemia, ACS.  Unlikely to be aortic dissection,  pulmonary embolism, pneumonia, pleural effusion. Laboratory evaluation with normal electrolytes, negative troponin, normal EKG, normal CBC, normal chest x-ray. Provided aspirin 325, sublingual nitroglycerin with improvement in chest pain.  Impression: Chest Pain   Plan: Admit to medicine for further evaluation and treatment         Final Clinical Impression(s) / ED Diagnoses Final diagnoses:  Anginal chest pain at rest [facility])    Rx / DC Orders ED Discharge Orders     None         Domenic Moras, PA-C 07/14/21 [place], MD 07/14/21 [phone]

## 2021-07-14 NOTE — Progress Notes (Signed)
HOSPITAL MEDICINE OVERNIGHT EVENT NOTE    Notified by nursing that patient continues to complain of chest discomfort that she describes as pressure-like in quality, mostly located in either the center or the left side of the chest.  Patient also complains of some associated shortness of breath but is not hypoxic or in respiratory distress according to nursing.  Chart reviewed, patient is already had 2 troponins that are unremarkable and an unremarkable chest x-ray.  Admitting provider has ordered a CT coronary angiogram for this patient.  EKG obtained and personally reviewed revealing no evidence of dynamic ST segment change.  Will obtain repeat troponin and obtain a D-dimer.  Considering the atypical nature of the patient's symptoms we will also give a trial dose of GI cocktail.  Will continue to monitor.  Taylor Emerald  MD [facility]

## 2021-07-14 NOTE — Care Management Obs Status (Signed)
[place] NOTIFICATION   Patient Details  Name: Taylor Bowman MRN: [id number] Date of Birth: 1971-05-29   Medicare Observation Status Notification Given:  Yes    Bethena Roys, RN 07/14/2021, 3:36 PM

## 2021-07-15 ENCOUNTER — Observation Stay (HOSPITAL_COMMUNITY): Payer: Medicare HMO

## 2021-07-15 ENCOUNTER — Observation Stay (HOSPITAL_BASED_OUTPATIENT_CLINIC_OR_DEPARTMENT_OTHER): Payer: Medicare HMO

## 2021-07-15 DIAGNOSIS — E785 Hyperlipidemia, unspecified: Secondary | ICD-10-CM | POA: Diagnosis not present

## 2021-07-15 DIAGNOSIS — I10 Essential (primary) hypertension: Secondary | ICD-10-CM | POA: Diagnosis not present

## 2021-07-15 DIAGNOSIS — R0789 Other chest pain: Secondary | ICD-10-CM

## 2021-07-15 DIAGNOSIS — F32A Depression, unspecified: Secondary | ICD-10-CM

## 2021-07-15 DIAGNOSIS — I208 Other forms of angina pectoris: Secondary | ICD-10-CM | POA: Diagnosis not present

## 2021-07-15 DIAGNOSIS — R079 Chest pain, unspecified: Secondary | ICD-10-CM

## 2021-07-15 DIAGNOSIS — F419 Anxiety disorder, unspecified: Secondary | ICD-10-CM | POA: Diagnosis not present

## 2021-07-15 LAB — ECHOCARDIOGRAM COMPLETE
Area-P 1/2: 3.48 cm2
Height: 64 in
S' Lateral: 2.6 cm
Weight: 2998.26 oz

## 2021-07-15 LAB — MAGNESIUM: Magnesium: 1.7 mg/dL (ref 1.7–2.4)

## 2021-07-15 LAB — BASIC METABOLIC PANEL
Anion gap: 10 (ref 5–15)
BUN: 18 mg/dL (ref 6–20)
CO2: 26 mmol/L (ref 22–32)
Calcium: 9.6 mg/dL (ref 8.9–10.3)
Chloride: 105 mmol/L (ref 98–111)
Creatinine, Ser: 1.16 mg/dL — ABNORMAL HIGH (ref 0.44–1.00)
GFR, Estimated: 57 mL/min — ABNORMAL LOW (ref >60)
Glucose, Bld: 129 mg/dL — ABNORMAL HIGH (ref 70–99)
Potassium: 3.6 mmol/L (ref 3.5–5.1)
Sodium: 141 mmol/L (ref 135–145)

## 2021-07-15 LAB — TROPONIN I (HIGH SENSITIVITY): Troponin I (High Sensitivity): 6 ng/L (ref <18)

## 2021-07-15 LAB — D-DIMER, QUANTITATIVE: D-Dimer, Quant: 0.28 ug/mL-FEU (ref 0.00–0.50)

## 2021-07-15 MED ORDER — NITROGLYCERIN 0.4 MG SL SUBL
SUBLINGUAL_TABLET | SUBLINGUAL | Status: AC
  Filled 2021-07-15: qty 1

## 2021-07-15 MED ORDER — IOHEXOL 350 MG/ML SOLN
95.0000 mL | Freq: Once | INTRAVENOUS | Status: AC | PRN
  Administered 2021-07-15: 95 mL via INTRAVENOUS

## 2021-07-15 NOTE — Progress Notes (Signed)
Patient had 2 PVCs followed by a beat and then a 5 beat run of [place]. Patient says she "felt a flutter" but otherwise ok. I notified Dr. Karleen Hampshire and she was going to add a mag on to am labs. I inquired about patients CT and echo. SHe stated cards had not read it yet for a final report and patient would stay overnight and be kept NPO in am. MD stated she would update patient

## 2021-07-15 NOTE — Progress Notes (Signed)
Patient to go for cardiac CT, patients bp is stable at 125/75, heart rate in the 50s. CT department needed me to come and administer 2 SL nitro for the test to be completed. I notified Rosaria Ferries to make sure she was ok with these vital signs prior to administration. She stated to give NS at 75cc/hr before CT and some after to help prevent any hypotension. I administered the IVF and gave the 2 SL Nitro while downstairs and I monitored patient after I gave her the nitro. She tolerated the nitro fine without any issues with heart rate and blood pressure. Once patient finished and back up to floor, I d/c'd IVF as this wasn't a standing order. Patient stable and in room. MD to follow up with results. Patients family at bedside when we returned from [place]. Patient stable

## 2021-07-15 NOTE — Progress Notes (Signed)
PROGRESS NOTE    Taylor Bowman  WIO:[id number] DOB: 01-24-1971 DOA: 07/14/2021 PCP: Wenda Low, MD    Chief Complaint  Patient presents with   Chest Pain    Brief Narrative:   Taylor Bowman is a 51 y.o. female with medical history significant of HTN, HLD, chronic back pain on narcotics, lumbar spine spondylosis status post fusion, anxiety/depression, presented with new onset of chest pain.  Assessment & Plan:   Principal Problem:   Chest pain Active Problems:   Radiculopathy   Anxiety and depression   Hypertension   Hyperlipidemia   Atypical chest pain  Chest pain.  Substernal, resolved.  Associated with some nausea and lightheadedness.  Echocardiogram unremarkable.  Troponins negative.  EKG does not show any ischemic changes. No recurrence of chest pain today.  CT coronaries ordered and results pending.  Will request cardiology consult in am.  NPo after might for possible stress test in am.  Lipid panel ordered.  Meanwhile continue with aspirin, statin.    Hypertension:  Optimal .  Continue with lisinopril and HCTZ  Hyperlipidemia:  Resume statin.    Chronic back pain  Resume pain meds and gabapentin.        DVT prophylaxis: (Lovenox) Code Status: (Full code) Family Communication: none at bedside Disposition:   Status is: Observation  The patient will require care spanning > 2 midnights and should be moved to inpatient because: work up for chest pain.       Consultants:  None.   Procedures: CT coronaries. Antimicrobials: none  Subjective: No chest pain now, improved. No nausea or vomiting.   Objective: Vitals:   07/15/21 0543 07/15/21 1045 07/15/21 1145 07/15/21 1528  BP: 101/66 125/75 114/75 123/70  Pulse: (!) 53     Resp: 16   18  Temp: 97.8 F (36.6 C)   98.1 F (36.7 C)  TempSrc: Oral   Oral  SpO2: 97%   98%  Weight:      Height:       No intake or output data in the 24 hours ending 07/15/21 1703 Filed Weights    07/14/21 0657  Weight: 85 kg    Examination:  General exam: Appears calm and comfortable  Respiratory system: Clear to auscultation. Respiratory effort normal. Cardiovascular system: S1 & S2 heard, RRR. No JVD,  No pedal edema. Gastrointestinal system: Abdomen is nondistended, soft and nontender.  Normal bowel sounds heard. Central nervous system: Alert and oriented. No focal neurological deficits. Extremities: Symmetric 5 x 5 power. Skin: No rashes, lesions or ulcers Psychiatry: Mood & affect appropriate.     Data Reviewed: I have personally reviewed following labs and imaging studies  CBC: Recent Labs  Lab 07/14/21 0722  WBC 8.6  HGB 14.1  HCT 43.0  MCV 85.5  PLT 426    Basic Metabolic Panel: Recent Labs  Lab 07/14/21 0722 07/15/21 0034  NA 139 141  K 3.4* 3.6  CL 104 105  CO2 25 26  GLUCOSE 126* 129*  BUN 21* 18  CREATININE 1.17* 1.16*  CALCIUM 9.4 9.6    GFR: Estimated Creatinine Clearance: 60.5 mL/min (A) (by C-G formula based on SCr of 1.16 mg/dL (H)).  Liver Function Tests: No results for input(s): AST, ALT, ALKPHOS, BILITOT, PROT, ALBUMIN in the last 168 hours.  CBG: No results for input(s): GLUCAP in the last 168 hours.   Recent Results (from the past 240 hour(s))  Resp Panel by RT-PCR (Flu A&B, Covid) Nasopharyngeal Swab     Status:  None   Collection Time: 07/14/21 11:58 AM   Specimen: Nasopharyngeal Swab; Nasopharyngeal(NP) swabs in vial transport medium  Result Value Ref Range Status   SARS Coronavirus 2 by RT PCR NEGATIVE NEGATIVE Final    Comment: (NOTE) SARS-CoV-2 target nucleic acids are NOT DETECTED.  The SARS-CoV-2 RNA is generally detectable in upper respiratory specimens during the acute phase of infection. The lowest concentration of SARS-CoV-2 viral copies this assay can detect is 138 copies/mL. A negative result does not preclude SARS-Cov-2 infection and should not be used as the sole basis for treatment or other patient  management decisions. A negative result may occur with  improper specimen collection/handling, submission of specimen other than nasopharyngeal swab, presence of viral mutation(s) within the areas targeted by this assay, and inadequate number of viral copies(<138 copies/mL). A negative result must be combined with clinical observations, patient history, and epidemiological information. The expected result is Negative.  Fact Sheet for Patients:  EntrepreneurPulse.com.au  Fact Sheet for Healthcare Providers:  IncredibleEmployment.be  This test is no t yet approved or cleared by the [place] FDA and  has been authorized for detection and/or diagnosis of SARS-CoV-2 by FDA under an Emergency Use Authorization (EUA). This EUA will remain  in effect (meaning this test can be used) for the duration of the COVID-19 declaration under Section 564(b)(1) of the Act, 21 U.S.C.section 360bbb-3(b)(1), unless the authorization is terminated  or revoked sooner.       Influenza A by PCR NEGATIVE NEGATIVE Final   Influenza B by PCR NEGATIVE NEGATIVE Final    Comment: (NOTE) The Xpert Xpress SARS-CoV-2/FLU/RSV plus assay is intended as an aid in the diagnosis of influenza from Nasopharyngeal swab specimens and should not be used as a sole basis for treatment. Nasal washings and aspirates are unacceptable for Xpert Xpress SARS-CoV-2/FLU/RSV testing.  Fact Sheet for Patients: EntrepreneurPulse.com.au  Fact Sheet for Healthcare Providers: IncredibleEmployment.be  This test is not yet approved or cleared by the [place] FDA and has been authorized for detection and/or diagnosis of SARS-CoV-2 by FDA under an Emergency Use Authorization (EUA). This EUA will remain in effect (meaning this test can be used) for the duration of the COVID-19 declaration under Section 564(b)(1) of the Act, 21 U.S.C. section 360bbb-3(b)(1),  unless the authorization is terminated or revoked.  Performed at [facility], [place] [address], [place], [place] [address]          Radiology Studies: DG Chest 2 View  Result Date: 07/14/2021 CLINICAL DATA:  Chest pain EXAM: CHEST - 2 VIEW COMPARISON:  03/31/2018 FINDINGS: Normal heart size and mediastinal contours. No acute infiltrate or edema. No effusion or pneumothorax. No acute osseous findings. IMPRESSION: No active cardiopulmonary disease. Electronically Signed   By: Jorje Guild M.D.   On: 07/14/2021 07:27   CT CORONARY MORPH W/CTA COR W/SCORE W/CA W/CM &/OR WO/CM  Result Date: 07/15/2021 EXAM: OVER-READ INTERPRETATION  CT CHEST The following report is an over-read performed by radiologist Dr. Vinnie Langton of [facility], [place] on 07/15/2021. This over-read does not include interpretation of cardiac or coronary anatomy or pathology. The coronary calcium score/coronary CTA interpretation by the cardiologist is attached. COMPARISON:  None. FINDINGS: Aortic atherosclerosis with ectasia of the ascending thoracic aorta (4.2 cm in diameter). Within the visualized portions of the thorax there are no suspicious appearing pulmonary nodules or masses, there is no acute consolidative airspace disease, no pleural effusions, no pneumothorax and no lymphadenopathy. Visualized portions of the upper abdomen are  unremarkable. There are no aggressive appearing lytic or blastic lesions noted in the visualized portions of the skeleton. IMPRESSION: 1.  Aortic Atherosclerosis (ICD10-I70.0). 2. Ectasia of ascending thoracic aorta (4.2 cm in diameter). Recommend annual imaging followup by CTA or MRA. This recommendation follows 2010 ACCF/AHA/AATS/ACR/ASA/SCA/SCAI/SIR/STS/SVM Guidelines for the Diagnosis and Management of Patients with Thoracic Aortic Disease. Circulation. 2010; 121: G818-H631. Aortic aneurysm NOS (ICD10-I71.9). Electronically Signed   By: Vinnie Langton M.D.   On: 07/15/2021  12:05   ECHOCARDIOGRAM COMPLETE  Result Date: 07/15/2021    ECHOCARDIOGRAM REPORT   Patient Name:   Taylor Bowman Date of Exam: 07/15/2021 Medical Rec #:  [id number]   Height:       64.0 in Accession #:    [id number]  Weight:       187.4 lb Date of Birth:  24-Oct-1970   BSA:          1.903 m Patient Age:    51 years    BP:           114/75 mmHg Patient Gender: F           HR:           62 bpm. Exam Location:  Inpatient Procedure: 2D Echo Indications:    chest pain  History:        Patient has prior history of Echocardiogram examinations, most                 recent 10/17/2007. Risk Factors:Hypertension and Dyslipidemia.  Sonographer:    Rolette Referring Phys: [id number] Raymond  1. Left ventricular ejection fraction, by estimation, is 60 to 65%. The left ventricle has normal function. The left ventricle has no regional wall motion abnormalities. Left ventricular diastolic parameters were normal.  2. Right ventricular systolic function is normal. The right ventricular size is normal. There is normal pulmonary artery systolic pressure.  3. The mitral valve is grossly normal. No evidence of mitral valve regurgitation.  4. The aortic valve is normal in structure. Aortic valve regurgitation is not visualized. No aortic stenosis is present.  5. The inferior vena cava is normal in size with greater than 50% respiratory variability, suggesting right atrial pressure of 3 mmHg. Comparison(s): No prior Echocardiogram. Conclusion(s)/Recommendation(s): Normal biventricular function without evidence of hemodynamically significant valvular heart disease. FINDINGS  Left Ventricle: Left ventricular ejection fraction, by estimation, is 60 to 65%. The left ventricle has normal function. The left ventricle has no regional wall motion abnormalities. The left ventricular internal cavity size was normal in size. There is  no left ventricular hypertrophy. Left ventricular diastolic parameters were normal. Right  Ventricle: The right ventricular size is normal. No increase in right ventricular wall thickness. Right ventricular systolic function is normal. There is normal pulmonary artery systolic pressure. Left Atrium: Left atrial size was normal in size. Right Atrium: Right atrial size was normal in size. Pericardium: There is no evidence of pericardial effusion. Mitral Valve: The mitral valve is grossly normal. No evidence of mitral valve regurgitation. Tricuspid Valve: The tricuspid valve is normal in structure. Tricuspid valve regurgitation is not demonstrated. Aortic Valve: The aortic valve is normal in structure. Aortic valve regurgitation is not visualized. No aortic stenosis is present. Pulmonic Valve: The pulmonic valve was grossly normal. Pulmonic valve regurgitation is trivial. Aorta: The aortic root and ascending aorta are structurally normal, with no evidence of dilitation. Venous: The inferior vena cava is normal in size with greater than 50% respiratory variability,  suggesting right atrial pressure of 3 mmHg. IAS/Shunts: The interatrial septum was not well visualized.  LEFT VENTRICLE PLAX 2D LVIDd:         4.40 cm   Diastology LVIDs:         2.60 cm   LV e' medial:    5.98 cm/s LV PW:         1.10 cm   LV E/e' medial:  12.1 LV IVS:        0.90 cm   LV e' lateral:   8.27 cm/s LVOT diam:     1.90 cm   LV E/e' lateral: 8.7 LV SV:         59 LV SV Index:   31 LVOT Area:     2.84 cm  RIGHT VENTRICLE             IVC RV S prime:     16.90 cm/s  IVC diam: 1.00 cm TAPSE (M-mode): 2.5 cm LEFT ATRIUM             Index LA diam:        3.20 cm 1.68 cm/m LA Vol (A2C):   49.9 ml 26.22 ml/m LA Vol (A4C):   55.0 ml 28.90 ml/m LA Biplane Vol: 52.4 ml 27.54 ml/m  AORTIC VALVE LVOT Vmax:   100.65 cm/s LVOT Vmean:  72.200 cm/s LVOT VTI:    0.209 m  AORTA Ao Root diam: 3.20 cm Ao Asc diam:  3.70 cm MITRAL VALVE MV Area (PHT): 3.48 cm    SHUNTS MV Decel Time: 218 msec    Systemic VTI:  0.21 m MV E velocity: 72.30 cm/s   Systemic Diam: 1.90 cm MV A velocity: 61.60 cm/s MV E/A ratio:  1.17 Landscape architect signed by Phineas Inches Signature Date/Time: 07/15/2021/3:41:52 PM    Final         Scheduled Meds:  aspirin EC  81 mg Oral Daily   atorvastatin  40 mg Oral q1800   enoxaparin (LOVENOX) injection  40 mg Subcutaneous Q24H   fentaNYL  1 patch Transdermal Q72H   gabapentin  300 mg Oral TID   lisinopril  10 mg Oral Daily   And   hydrochlorothiazide  12.5 mg Oral Daily   lidocaine-prilocaine  4-6 application Topical QID   multivitamin with minerals  1 tablet Oral Daily   neomycin-polymyxin b-dexamethasone  1 application Right Eye TID   zolpidem  5 mg Oral QHS   Continuous Infusions:   LOS: 0 days        Hosie Poisson, MD [facility]   To contact the attending provider between 7A-7P or the covering provider during after hours 7P-7A, please log into the web site [email or web address] and access using universal [place] password for that web site. If you do not have the password, please call the hospital operator.  07/15/2021, 5:03 PM

## 2021-07-15 NOTE — Progress Notes (Signed)
°  Echocardiogram 2D Echocardiogram has been performed.  Taylor Bowman 07/15/2021, 3:20 PM

## 2021-07-15 NOTE — Progress Notes (Signed)
°  Transition of Care [facility]) Screening Note   Patient Details  Name: Taylor Bowman Date of Birth: [birth year]   Transition of Care [facility]) CM/SW Contact:    Bary Castilla, LCSW Phone Number: 07/15/2021, 2:53 PM    Transition of Care Department [facility]) has reviewed patient and no TOC needs have been identified at this time. We will continue to monitor patient advancement through interdisciplinary progression rounds. If new patient transition needs arise, please place a TOC consult.

## 2021-07-16 ENCOUNTER — Ambulatory Visit (INDEPENDENT_AMBULATORY_CARE_PROVIDER_SITE_OTHER): Payer: Medicare HMO

## 2021-07-16 ENCOUNTER — Other Ambulatory Visit: Payer: Self-pay | Admitting: Physician Assistant

## 2021-07-16 DIAGNOSIS — R002 Palpitations: Secondary | ICD-10-CM

## 2021-07-16 DIAGNOSIS — F32A Depression, unspecified: Secondary | ICD-10-CM | POA: Diagnosis not present

## 2021-07-16 DIAGNOSIS — E785 Hyperlipidemia, unspecified: Secondary | ICD-10-CM | POA: Diagnosis not present

## 2021-07-16 DIAGNOSIS — F419 Anxiety disorder, unspecified: Secondary | ICD-10-CM | POA: Diagnosis not present

## 2021-07-16 DIAGNOSIS — R0789 Other chest pain: Secondary | ICD-10-CM | POA: Diagnosis not present

## 2021-07-16 LAB — BASIC METABOLIC PANEL
Anion gap: 12 (ref 5–15)
BUN: 17 mg/dL (ref 6–20)
CO2: 26 mmol/L (ref 22–32)
Calcium: 10.1 mg/dL (ref 8.9–10.3)
Chloride: 101 mmol/L (ref 98–111)
Creatinine, Ser: 1.17 mg/dL — ABNORMAL HIGH (ref 0.44–1.00)
GFR, Estimated: 56 mL/min — ABNORMAL LOW (ref >60)
Glucose, Bld: 108 mg/dL — ABNORMAL HIGH (ref 70–99)
Potassium: 3.7 mmol/L (ref 3.5–5.1)
Sodium: 139 mmol/L (ref 135–145)

## 2021-07-16 LAB — LIPID PANEL
Cholesterol: 185 mg/dL (ref 0–200)
HDL: 46 mg/dL (ref >40)
LDL Cholesterol: 97 mg/dL (ref 0–99)
Total CHOL/HDL Ratio: 4 RATIO
Triglycerides: 208 mg/dL — ABNORMAL HIGH (ref <150)
VLDL: 42 mg/dL — ABNORMAL HIGH (ref 0–40)

## 2021-07-16 LAB — CBC
HCT: 40.9 % (ref 36.0–46.0)
Hemoglobin: 13.7 g/dL (ref 12.0–15.0)
MCH: 28.4 pg (ref 26.0–34.0)
MCHC: 33.5 g/dL (ref 30.0–36.0)
MCV: 84.7 fL (ref 80.0–100.0)
Platelets: 276 10*3/uL (ref 150–400)
RBC: 4.83 MIL/uL (ref 3.87–5.11)
RDW: 12.5 % (ref 11.5–15.5)
WBC: 10.1 10*3/uL (ref 4.0–10.5)
nRBC: 0 % (ref 0.0–0.2)

## 2021-07-16 MED ORDER — ALPRAZOLAM 0.5 MG PO TABS: 0.5000 mg | ORAL_TABLET | Freq: Two times a day (BID) | ORAL | Status: DC | PRN

## 2021-07-16 MED ORDER — ASPIRIN 81 MG PO TBEC: 81.0000 mg | DELAYED_RELEASE_TABLET | Freq: Every day | ORAL | 11 refills | Status: DC

## 2021-07-16 MED ORDER — ALPRAZOLAM 0.5 MG PO TABS: 0.5000 mg | ORAL_TABLET | Freq: Every day | ORAL | 0 refills | Status: AC | PRN

## 2021-07-16 NOTE — Progress Notes (Unsigned)
Enrolled for [employer] to mail a ZIO XT long term holter monitor to the patients address on file.  

## 2021-07-16 NOTE — Plan of Care (Signed)
°  Problem: Education: Goal: Understanding of cardiac disease, CV risk reduction, and recovery process will improve Outcome: Adequate for Discharge Goal: Individualized Educational Video(s) Outcome: Adequate for Discharge   Problem: Cardiac: Goal: Ability to achieve and maintain adequate cardiovascular perfusion will improve Outcome: Adequate for Discharge   Problem: Clinical Measurements: Goal: Ability to maintain clinical measurements within normal limits will improve Outcome: Adequate for Discharge Goal: Will remain free from infection Outcome: Adequate for Discharge Goal: Respiratory complications will improve Outcome: Adequate for Discharge Goal: Cardiovascular complication will be avoided Outcome: Adequate for Discharge   Problem: Activity: Goal: Risk for activity intolerance will decrease Outcome: Adequate for Discharge   Problem: Nutrition: Goal: Adequate nutrition will be maintained Outcome: Adequate for Discharge   Problem: Coping: Goal: Level of anxiety will decrease Outcome: Adequate for Discharge   Problem: Elimination: Goal: Will not experience complications related to bowel motility Outcome: Adequate for Discharge Goal: Will not experience complications related to urinary retention Outcome: Adequate for Discharge   Problem: Pain Managment: Goal: General experience of comfort will improve Outcome: Adequate for Discharge   Problem: Safety: Goal: Ability to remain free from injury will improve Outcome: Adequate for Discharge   Problem: Skin Integrity: Goal: Risk for impaired skin integrity will decrease Outcome: Adequate for Discharge

## 2021-07-16 NOTE — Progress Notes (Signed)
Reviewed with Dr. Gwenlyn Found, normal echocardiogram, minimal plaque seen on coronary CT. No further need for chest pain eval, consider noncardiac cause for chest pain. Discussed with primary team, will set the patient up for 2 week Zio monitor to evaluate for palpitation.

## 2021-07-18 NOTE — Discharge Summary (Signed)
Physician Discharge Summary  Taylor Bowman DQQ:[id number] DOB: 1970/07/09 DOA: 07/14/2021  PCP: Wenda Low, MD  Admit date: 07/14/2021 Discharge date: 07/16/2021  Admitted From: Home.  Disposition:  Home.   Recommendations for Outpatient Follow-up:  Follow up with PCP in 1-2 weeks Please obtain BMP/CBC in one week Please follow up with cardiology as outpatient   Discharge Condition:stable.  CODE STATUS:full code.  Diet recommendation: Heart Healthy  Brief/Interim Summary: Taylor Bowman is a 51 y.o. female with medical history significant of HTN, HLD, chronic back pain on narcotics, lumbar spine spondylosis status post fusion, anxiety/depression, presented with new onset of chest pain.  Discharge Diagnoses:  Principal Problem:   Chest pain Active Problems:   Radiculopathy   Anxiety and depression   Hypertension   Hyperlipidemia   Atypical chest pain   Atypical Chest pain.  Substernal, resolved. Probably anxiety.  Associated with some nausea and lightheadedness.  Echocardiogram unremarkable.  Troponins negative.  EKG does not show any ischemic changes. No recurrence of chest pain today.  CT coronaries ordered and  shows minimal disease.  Lipid panel ordered.  Meanwhile continue with aspirin, statin.  Zio patch placement and evaluation by cardiology as outpatient.      Hypertension:  Optimal .  Continue with lisinopril and HCTZ   Hyperlipidemia:  Resume statin.      Chronic back pain  Resume pain meds and gabapentin.           Discharge Instructions  Discharge Instructions     Diet - low sodium heart healthy   Complete by: As directed    Discharge instructions   Complete by: As directed    Please follow up with cardiology for the monitor placement and evaluation.   Increase activity slowly   Complete by: As directed       Allergies as of 07/16/2021       Reactions   Bee Venom Anaphylaxis, Hives   Codeine Hives   Demerol Hives   Morphine And  Related Hives, Itching        Medication List     STOP taking these medications    hydrochlorothiazide 12.5 MG tablet Commonly known as: HYDRODIURIL   lidocaine-prilocaine cream Commonly known as: EMLA   valsartan 80 MG tablet Commonly known as: DIOVAN       TAKE these medications    ALPRAZolam 0.5 MG tablet Commonly known as: XANAX Take 1 tablet (0.5 mg total) by mouth daily as needed for anxiety.   aspirin 81 MG EC tablet Take 1 tablet (81 mg total) by mouth daily. Swallow whole.   atorvastatin 40 MG tablet Commonly known as: LIPITOR Take 1 tablet (40 mg total) by mouth daily at 6 PM. What changed: when to take this   diphenhydrAMINE 25 MG tablet Commonly known as: BENADRYL Take 25 mg by mouth daily as needed for itching (seasonal allergies).   EpiPen 2-Pak 0.3 mg/0.3 mL Soaj injection Generic drug: EPINEPHrine Inject 0.3 mg into the muscle as needed for anaphylaxis.   fentaNYL 12 MCG/HR Commonly known as: DURAGESIC Place 12 mcg onto the skin every 3 (three) days.   gabapentin 300 MG capsule Commonly known as: NEURONTIN Take 300-600 mg by mouth See admin instructions. Take one capsule (300 mg) by mouth every morning and mid-afternoon, take two capsules (600 mg) at bedtime   ibuprofen 200 MG tablet Commonly known as: ADVIL Take 400 mg by mouth daily as needed (pain).   lisinopril-hydrochlorothiazide 10-12.5 MG tablet Commonly known as: ZESTORETIC Take 1  tablet by mouth daily.   multivitamin tablet Take 1 tablet by mouth every morning.   neomycin-polymyxin b-dexamethasone 3.5-10000-0.1 Oint Commonly known as: MAXITROL Place 1 application into the right eye See admin instructions. Apply sparingly to right eye three times daily until healed   oxyCODONE-acetaminophen 10-325 MG tablet Commonly known as: PERCOCET Take 1 tablet by mouth every 6 (six) hours.   tiZANidine 2 MG tablet Commonly known as: ZANAFLEX Take 2 mg by mouth 3 (three) times daily  as needed for muscle spasms.   zolpidem 10 MG tablet Commonly known as: AMBIEN Take 10 mg by mouth at bedtime.        Allergies  Allergen Reactions   Bee Venom Anaphylaxis and Hives   Codeine Hives   Demerol Hives   Morphine And Related Hives and Itching    Consultations: Curbside with cardiology.    Procedures/Studies: DG Chest 2 View  Result Date: 07/14/2021 CLINICAL DATA:  Chest pain EXAM: CHEST - 2 VIEW COMPARISON:  03/31/2018 FINDINGS: Normal heart size and mediastinal contours. No acute infiltrate or edema. No effusion or pneumothorax. No acute osseous findings. IMPRESSION: No active cardiopulmonary disease. Electronically Signed   By: Jorje Guild M.D.   On: 07/14/2021 07:27   CT CORONARY MORPH W/CTA COR W/SCORE W/CA W/CM &/OR WO/CM  Addendum Date: 07/16/2021   ADDENDUM REPORT: 07/16/2021 11:33 CLINICAL DATA:  Chest pain EXAM: Cardiac CTA MEDICATIONS: Sub lingual nitro. 4 mg TECHNIQUE: The patient was scanned on a [employer] 192 scanner. Gantry rotation speed was 250 msecs. Collimation was. 6 mm . A 120 kV prospective scan was triggered in the ascending thoracic aorta at 140 HU's with full mA between 30-70% of the R-R interval . Average HR during the scan was 60 bpm. The 3D data set was interpreted on a dedicated work station using MPR, MIP and VRT modes. A total of 80 cc of contrast was used. FINDINGS: Non-cardiac: See separate report from [facility]. No significant findings on limited lung and soft tissue windows. Calcium score: Mild calcium noted in RCA and circumflex Coronary Arteries: Right dominant with no anomalies LM: Normal LAD: Normal D1: Normal D2: Normal Circumflex: 1-24% calcified plaque proximally OM1: Normal OM2: Normal RCA: 1-24% calcified plaque in proximal vessel PDA: Normal PLA: Normal IMPRESSION: 1. Calcium score 43.1 which is 95 th percentile for age/sex 2. Dilated ascending thoracic aorta 4.2 cm see radiology recommendations below 3.  CAD RADS  1 non obstructive CAD see description above [place] Electronically Signed   By: [place] Rouge M.D.   On: 07/16/2021 11:33   Result Date: 07/16/2021 EXAM: OVER-READ INTERPRETATION  CT CHEST The following report is an over-read performed by radiologist Dr. Vinnie Langton of [facility], [place] on 07/15/2021. This over-read does not include interpretation of cardiac or coronary anatomy or pathology. The coronary calcium score/coronary CTA interpretation by the cardiologist is attached. COMPARISON:  None. FINDINGS: Aortic atherosclerosis with ectasia of the ascending thoracic aorta (4.2 cm in diameter). Within the visualized portions of the thorax there are no suspicious appearing pulmonary nodules or masses, there is no acute consolidative airspace disease, no pleural effusions, no pneumothorax and no lymphadenopathy. Visualized portions of the upper abdomen are unremarkable. There are no aggressive appearing lytic or blastic lesions noted in the visualized portions of the skeleton. IMPRESSION: 1.  Aortic Atherosclerosis (ICD10-I70.0). 2. Ectasia of ascending thoracic aorta (4.2 cm in diameter). Recommend annual imaging followup by CTA or MRA. This recommendation follows 2010 ACCF/AHA/AATS/ACR/ASA/SCA/SCAI/SIR/STS/SVM Guidelines for the Diagnosis  and Management of Patients with Thoracic Aortic Disease. Circulation. 2010; 121: I951-O841. Aortic aneurysm NOS (ICD10-I71.9). Electronically Signed: By: Vinnie Langton M.D. On: 07/15/2021 12:05   ECHOCARDIOGRAM COMPLETE  Result Date: 07/15/2021    ECHOCARDIOGRAM REPORT   Patient Name:   Taylor Bowman Date of Exam: 07/15/2021 Medical Rec #:  [id number]   Height:       64.0 in Accession #:    [id number]  Weight:       187.4 lb Date of Birth:  19-Jul-1970   BSA:          1.903 m Patient Age:    51 years    BP:           114/75 mmHg Patient Gender: F           HR:           62 bpm. Exam Location:  Inpatient Procedure: 2D Echo Indications:    chest pain  History:         Patient has prior history of Echocardiogram examinations, most                 recent 10/17/2007. Risk Factors:Hypertension and Dyslipidemia.  Sonographer:    [place] Referring Phys: [id number] [place]  1. Left ventricular ejection fraction, by estimation, is 60 to 65%. The left ventricle has normal function. The left ventricle has no regional wall motion abnormalities. Left ventricular diastolic parameters were normal.  2. Right ventricular systolic function is normal. The right ventricular size is normal. There is normal pulmonary artery systolic pressure.  3. The mitral valve is grossly normal. No evidence of mitral valve regurgitation.  4. The aortic valve is normal in structure. Aortic valve regurgitation is not visualized. No aortic stenosis is present.  5. The inferior vena cava is normal in size with greater than 50% respiratory variability, suggesting right atrial pressure of 3 mmHg. Comparison(s): No prior Echocardiogram. Conclusion(s)/Recommendation(s): Normal biventricular function without evidence of hemodynamically significant valvular heart disease. FINDINGS  Left Ventricle: Left ventricular ejection fraction, by estimation, is 60 to 65%. The left ventricle has normal function. The left ventricle has no regional wall motion abnormalities. The left ventricular internal cavity size was normal in size. There is  no left ventricular hypertrophy. Left ventricular diastolic parameters were normal. Right Ventricle: The right ventricular size is normal. No increase in right ventricular wall thickness. Right ventricular systolic function is normal. There is normal pulmonary artery systolic pressure. Left Atrium: Left atrial size was normal in size. Right Atrium: Right atrial size was normal in size. Pericardium: There is no evidence of pericardial effusion. Mitral Valve: The mitral valve is grossly normal. No evidence of mitral valve regurgitation. Tricuspid Valve: The tricuspid  valve is normal in structure. Tricuspid valve regurgitation is not demonstrated. Aortic Valve: The aortic valve is normal in structure. Aortic valve regurgitation is not visualized. No aortic stenosis is present. Pulmonic Valve: The pulmonic valve was grossly normal. Pulmonic valve regurgitation is trivial. Aorta: The aortic root and ascending aorta are structurally normal, with no evidence of dilitation. Venous: The inferior vena cava is normal in size with greater than 50% respiratory variability, suggesting right atrial pressure of 3 mmHg. IAS/Shunts: The interatrial septum was not well visualized.  LEFT VENTRICLE PLAX 2D LVIDd:         4.40 cm   Diastology LVIDs:         2.60 cm   LV e' medial:    5.98 cm/s  LV PW:         1.10 cm   LV E/e' medial:  12.1 LV IVS:        0.90 cm   LV e' lateral:   8.27 cm/s LVOT diam:     1.90 cm   LV E/e' lateral: 8.7 LV SV:         59 LV SV Index:   31 LVOT Area:     2.84 cm  RIGHT VENTRICLE             IVC RV S prime:     16.90 cm/s  IVC diam: 1.00 cm TAPSE (M-mode): 2.5 cm LEFT ATRIUM             Index LA diam:        3.20 cm 1.68 cm/m LA Vol (A2C):   49.9 ml 26.22 ml/m LA Vol (A4C):   55.0 ml 28.90 ml/m LA Biplane Vol: 52.4 ml 27.54 ml/m  AORTIC VALVE LVOT Vmax:   100.65 cm/s LVOT Vmean:  72.200 cm/s LVOT VTI:    0.209 m  AORTA Ao Root diam: 3.20 cm Ao Asc diam:  3.70 cm MITRAL VALVE MV Area (PHT): 3.48 cm    SHUNTS MV Decel Time: 218 msec    Systemic VTI:  0.21 m MV E velocity: 72.30 cm/s  Systemic Diam: 1.90 cm MV A velocity: 61.60 cm/s MV E/A ratio:  1.17 Landscape architect signed by Phineas Inches Signature Date/Time: 07/15/2021/3:41:52 PM    Final       Subjective: No new complaints.   Discharge Exam: Vitals:   07/16/21 0929 07/16/21 1319  BP: 133/82 115/70  Pulse:  64  Resp:  18  Temp:  97.8 F (36.6 C)  SpO2:  97%   Vitals:   07/15/21 1941 07/16/21 0443 07/16/21 0929 07/16/21 1319  BP: (!) 148/89 117/65 133/82 115/70  Pulse: (!) 59 63  64   Resp:    18  Temp: (!) 97.4 F (36.3 C) 98.2 F (36.8 C)  97.8 F (36.6 C)  TempSrc: Oral Oral  Oral  SpO2: 94% 97%  97%  Weight:      Height:        General: Pt is alert, awake, not in acute distress Cardiovascular: RRR, S1/S2 +, no rubs, no gallops Respiratory: CTA bilaterally, no wheezing, no rhonchi Abdominal: Soft, NT, ND, bowel sounds + Extremities: no edema, no cyanosis    The results of significant diagnostics from this hospitalization (including imaging, microbiology, ancillary and laboratory) are listed below for reference.     Microbiology: Recent Results (from the past 240 hour(s))  Resp Panel by RT-PCR (Flu A&B, Covid) Nasopharyngeal Swab     Status: None   Collection Time: 07/14/21 11:58 AM   Specimen: Nasopharyngeal Swab; Nasopharyngeal(NP) swabs in vial transport medium  Result Value Ref Range Status   SARS Coronavirus 2 by RT PCR NEGATIVE NEGATIVE Final    Comment: (NOTE) SARS-CoV-2 target nucleic acids are NOT DETECTED.  The SARS-CoV-2 RNA is generally detectable in upper respiratory specimens during the acute phase of infection. The lowest concentration of SARS-CoV-2 viral copies this assay can detect is 138 copies/mL. A negative result does not preclude SARS-Cov-2 infection and should not be used as the sole basis for treatment or other patient management decisions. A negative result may occur with  improper specimen collection/handling, submission of specimen other than nasopharyngeal swab, presence of viral mutation(s) within the areas targeted by this assay, and inadequate number of viral  copies(<138 copies/mL). A negative result must be combined with clinical observations, patient history, and epidemiological information. The expected result is Negative.  Fact Sheet for Patients:  EntrepreneurPulse.com.au  Fact Sheet for Healthcare Providers:  IncredibleEmployment.be  This test is no t yet approved or  cleared by the [place] FDA and  has been authorized for detection and/or diagnosis of SARS-CoV-2 by FDA under an Emergency Use Authorization (EUA). This EUA will remain  in effect (meaning this test can be used) for the duration of the COVID-19 declaration under Section 564(b)(1) of the Act, 21 U.S.C.section 360bbb-3(b)(1), unless the authorization is terminated  or revoked sooner.       Influenza A by PCR NEGATIVE NEGATIVE Final   Influenza B by PCR NEGATIVE NEGATIVE Final    Comment: (NOTE) The Xpert Xpress SARS-CoV-2/FLU/RSV plus assay is intended as an aid in the diagnosis of influenza from Nasopharyngeal swab specimens and should not be used as a sole basis for treatment. Nasal washings and aspirates are unacceptable for Xpert Xpress SARS-CoV-2/FLU/RSV testing.  Fact Sheet for Patients: EntrepreneurPulse.com.au  Fact Sheet for Healthcare Providers: IncredibleEmployment.be  This test is not yet approved or cleared by the [place] FDA and has been authorized for detection and/or diagnosis of SARS-CoV-2 by FDA under an Emergency Use Authorization (EUA). This EUA will remain in effect (meaning this test can be used) for the duration of the COVID-19 declaration under Section 564(b)(1) of the Act, 21 U.S.C. section 360bbb-3(b)(1), unless the authorization is terminated or revoked.  Performed at [facility], [place] [address], [place], [place] [address]      Labs: BNP (last 3 results) No results for input(s): BNP in the last 8760 hours. Basic Metabolic Panel: Recent Labs  Lab 07/14/21 0722 07/15/21 0034 07/16/21 0200  NA 139 141 139  K 3.4* 3.6 3.7  CL 104 105 101  CO2 25 26 26   GLUCOSE 126* 129* 108*  BUN 21* 18 17  CREATININE 1.17* 1.16* 1.17*  CALCIUM 9.4 9.6 10.1  MG  --  1.7  --    Liver Function Tests: No results for input(s): AST, ALT, ALKPHOS, BILITOT, PROT, ALBUMIN in the last 168 hours. No results for  input(s): LIPASE, AMYLASE in the last 168 hours. No results for input(s): AMMONIA in the last 168 hours. CBC: Recent Labs  Lab 07/14/21 0722 07/16/21 0200  WBC 8.6 10.1  HGB 14.1 13.7  HCT 43.0 40.9  MCV 85.5 84.7  PLT 258 276   Cardiac Enzymes: No results for input(s): CKTOTAL, CKMB, CKMBINDEX, TROPONINI in the last 168 hours. BNP: Invalid input(s): POCBNP CBG: No results for input(s): GLUCAP in the last 168 hours. D-Dimer No results for input(s): DDIMER in the last 72 hours. Hgb A1c No results for input(s): HGBA1C in the last 72 hours. Lipid Profile Recent Labs    07/16/21 0200  CHOL 185  HDL 46  LDLCALC 97  TRIG 208*  CHOLHDL 4.0   Thyroid function studies No results for input(s): TSH, T4TOTAL, T3FREE, THYROIDAB in the last 72 hours.  Invalid input(s): FREET3 Anemia work up No results for input(s): VITAMINB12, FOLATE, FERRITIN, TIBC, IRON, RETICCTPCT in the last 72 hours. Urinalysis    Component Value Date/Time   COLORURINE YELLOW 10/18/2014 1518   APPEARANCEUR CLOUDY (A) 10/18/2014 1518   LABSPEC 1.010 10/18/2014 1518   PHURINE 5.5 10/18/2014 1518   GLUCOSEU NEGATIVE 10/18/2014 1518   HGBUR NEGATIVE 10/18/2014 1518   BILIRUBINUR NEGATIVE 10/18/2014 1518   KETONESUR NEGATIVE 10/18/2014  [place] 10/18/2014 1518   UROBILINOGEN 0.2 10/18/2014 1518   NITRITE NEGATIVE 10/18/2014 1518   LEUKOCYTESUR SMALL (A) 10/18/2014 1518   Sepsis Labs Invalid input(s): PROCALCITONIN,  WBC,  LACTICIDVEN Microbiology Recent Results (from the past 240 hour(s))  Resp Panel by RT-PCR (Flu A&B, Covid) Nasopharyngeal Swab     Status: None   Collection Time: 07/14/21 11:58 AM   Specimen: Nasopharyngeal Swab; Nasopharyngeal(NP) swabs in vial transport medium  Result Value Ref Range Status   SARS Coronavirus 2 by RT PCR NEGATIVE NEGATIVE Final    Comment: (NOTE) SARS-CoV-2 target nucleic acids are NOT DETECTED.  The SARS-CoV-2 RNA is generally detectable in  upper respiratory specimens during the acute phase of infection. The lowest concentration of SARS-CoV-2 viral copies this assay can detect is 138 copies/mL. A negative result does not preclude SARS-Cov-2 infection and should not be used as the sole basis for treatment or other patient management decisions. A negative result may occur with  improper specimen collection/handling, submission of specimen other than nasopharyngeal swab, presence of viral mutation(s) within the areas targeted by this assay, and inadequate number of viral copies(<138 copies/mL). A negative result must be combined with clinical observations, patient history, and epidemiological information. The expected result is Negative.  Fact Sheet for Patients:  EntrepreneurPulse.com.au  Fact Sheet for Healthcare Providers:  IncredibleEmployment.be  This test is no t yet approved or cleared by the [place] FDA and  has been authorized for detection and/or diagnosis of SARS-CoV-2 by FDA under an Emergency Use Authorization (EUA). This EUA will remain  in effect (meaning this test can be used) for the duration of the COVID-19 declaration under Section 564(b)(1) of the Act, 21 U.S.C.section 360bbb-3(b)(1), unless the authorization is terminated  or revoked sooner.       Influenza A by PCR NEGATIVE NEGATIVE Final   Influenza B by PCR NEGATIVE NEGATIVE Final    Comment: (NOTE) The Xpert Xpress SARS-CoV-2/FLU/RSV plus assay is intended as an aid in the diagnosis of influenza from Nasopharyngeal swab specimens and should not be used as a sole basis for treatment. Nasal washings and aspirates are unacceptable for Xpert Xpress SARS-CoV-2/FLU/RSV testing.  Fact Sheet for Patients: EntrepreneurPulse.com.au  Fact Sheet for Healthcare Providers: IncredibleEmployment.be  This test is not yet approved or cleared by the [place] FDA and has been  authorized for detection and/or diagnosis of SARS-CoV-2 by FDA under an Emergency Use Authorization (EUA). This EUA will remain in effect (meaning this test can be used) for the duration of the COVID-19 declaration under Section 564(b)(1) of the Act, 21 U.S.C. section 360bbb-3(b)(1), unless the authorization is terminated or revoked.  Performed at [facility], [place] [address] [place], [place] [address]      Time coordinating discharge: 38 minutes.   SIGNED:   Hosie Poisson, MD  [facility]

## 2021-07-30 DIAGNOSIS — H0288A Meibomian gland dysfunction right eye, upper and lower eyelids: Secondary | ICD-10-CM | POA: Diagnosis not present

## 2021-07-30 DIAGNOSIS — M5106 Intervertebral disc disorders with myelopathy, lumbar region: Secondary | ICD-10-CM | POA: Diagnosis not present

## 2021-07-30 DIAGNOSIS — H0288B Meibomian gland dysfunction left eye, upper and lower eyelids: Secondary | ICD-10-CM | POA: Diagnosis not present

## 2021-07-30 DIAGNOSIS — H0011 Chalazion right upper eyelid: Secondary | ICD-10-CM | POA: Diagnosis not present

## 2021-07-30 DIAGNOSIS — M48062 Spinal stenosis, lumbar region with neurogenic claudication: Secondary | ICD-10-CM | POA: Diagnosis not present

## 2021-07-30 DIAGNOSIS — M5116 Intervertebral disc disorders with radiculopathy, lumbar region: Secondary | ICD-10-CM | POA: Diagnosis not present

## 2021-07-31 DIAGNOSIS — I700 Atherosclerosis of aorta: Secondary | ICD-10-CM | POA: Diagnosis not present

## 2021-07-31 DIAGNOSIS — F419 Anxiety disorder, unspecified: Secondary | ICD-10-CM | POA: Diagnosis not present

## 2021-07-31 DIAGNOSIS — H00011 Hordeolum externum right upper eyelid: Secondary | ICD-10-CM | POA: Diagnosis not present

## 2021-07-31 DIAGNOSIS — I77819 Aortic ectasia, unspecified site: Secondary | ICD-10-CM | POA: Diagnosis not present

## 2021-07-31 DIAGNOSIS — M48061 Spinal stenosis, lumbar region without neurogenic claudication: Secondary | ICD-10-CM | POA: Diagnosis not present

## 2021-07-31 DIAGNOSIS — I2510 Atherosclerotic heart disease of native coronary artery without angina pectoris: Secondary | ICD-10-CM | POA: Diagnosis not present

## 2021-08-02 DIAGNOSIS — R002 Palpitations: Secondary | ICD-10-CM | POA: Diagnosis not present

## 2021-08-10 DIAGNOSIS — H43391 Other vitreous opacities, right eye: Secondary | ICD-10-CM | POA: Diagnosis not present

## 2021-08-10 DIAGNOSIS — H0288A Meibomian gland dysfunction right eye, upper and lower eyelids: Secondary | ICD-10-CM | POA: Diagnosis not present

## 2021-08-10 DIAGNOSIS — H0011 Chalazion right upper eyelid: Secondary | ICD-10-CM | POA: Diagnosis not present

## 2021-08-20 DIAGNOSIS — H0011 Chalazion right upper eyelid: Secondary | ICD-10-CM | POA: Diagnosis not present

## 2021-08-27 ENCOUNTER — Other Ambulatory Visit: Payer: Self-pay | Admitting: Orthopaedic Surgery

## 2021-08-27 DIAGNOSIS — M48062 Spinal stenosis, lumbar region with neurogenic claudication: Secondary | ICD-10-CM | POA: Diagnosis not present

## 2021-08-27 DIAGNOSIS — Z79899 Other long term (current) drug therapy: Secondary | ICD-10-CM | POA: Diagnosis not present

## 2021-08-27 DIAGNOSIS — G894 Chronic pain syndrome: Secondary | ICD-10-CM | POA: Diagnosis not present

## 2021-08-27 DIAGNOSIS — M5416 Radiculopathy, lumbar region: Secondary | ICD-10-CM

## 2021-08-27 DIAGNOSIS — M5116 Intervertebral disc disorders with radiculopathy, lumbar region: Secondary | ICD-10-CM | POA: Diagnosis not present

## 2021-08-27 DIAGNOSIS — M5106 Intervertebral disc disorders with myelopathy, lumbar region: Secondary | ICD-10-CM | POA: Diagnosis not present

## 2021-08-27 DIAGNOSIS — Z79891 Long term (current) use of opiate analgesic: Secondary | ICD-10-CM | POA: Diagnosis not present

## 2021-09-11 ENCOUNTER — Ambulatory Visit
Admission: RE | Admit: 2021-09-11 | Discharge: 2021-09-11 | Disposition: A | Discharge status: Home / Self Care | Payer: Medicare HMO | Source: Ambulatory Visit | Attending: Orthopaedic Surgery | Admitting: Orthopaedic Surgery

## 2021-09-11 DIAGNOSIS — M5416 Radiculopathy, lumbar region: Secondary | ICD-10-CM

## 2021-09-11 DIAGNOSIS — M5136 Other intervertebral disc degeneration, lumbar region: Secondary | ICD-10-CM | POA: Diagnosis not present

## 2021-09-11 MED ORDER — GADOBENATE DIMEGLUMINE 529 MG/ML IV SOLN
15.0000 mL | Freq: Once | INTRAVENOUS | Status: AC | PRN
  Administered 2021-09-11: 15 mL via INTRAVENOUS

## 2021-09-27 DIAGNOSIS — M5116 Intervertebral disc disorders with radiculopathy, lumbar region: Secondary | ICD-10-CM | POA: Diagnosis not present

## 2021-09-27 DIAGNOSIS — M532X6 Spinal instabilities, lumbar region: Secondary | ICD-10-CM | POA: Diagnosis not present

## 2021-09-27 DIAGNOSIS — M48062 Spinal stenosis, lumbar region with neurogenic claudication: Secondary | ICD-10-CM | POA: Diagnosis not present

## 2021-09-27 DIAGNOSIS — M5106 Intervertebral disc disorders with myelopathy, lumbar region: Secondary | ICD-10-CM | POA: Diagnosis not present

## 2021-09-27 DIAGNOSIS — M4316 Spondylolisthesis, lumbar region: Secondary | ICD-10-CM | POA: Diagnosis not present

## 2021-10-25 DIAGNOSIS — M4316 Spondylolisthesis, lumbar region: Secondary | ICD-10-CM | POA: Diagnosis not present

## 2021-10-25 DIAGNOSIS — M5106 Intervertebral disc disorders with myelopathy, lumbar region: Secondary | ICD-10-CM | POA: Diagnosis not present

## 2021-10-25 DIAGNOSIS — M48062 Spinal stenosis, lumbar region with neurogenic claudication: Secondary | ICD-10-CM | POA: Diagnosis not present

## 2021-10-25 DIAGNOSIS — M532X6 Spinal instabilities, lumbar region: Secondary | ICD-10-CM | POA: Diagnosis not present

## 2021-10-25 DIAGNOSIS — M5116 Intervertebral disc disorders with radiculopathy, lumbar region: Secondary | ICD-10-CM | POA: Diagnosis not present

## 2021-11-01 DIAGNOSIS — Z1231 Encounter for screening mammogram for malignant neoplasm of breast: Secondary | ICD-10-CM | POA: Diagnosis not present

## 2021-11-01 DIAGNOSIS — Z6831 Body mass index (BMI) 31.0-31.9, adult: Secondary | ICD-10-CM | POA: Diagnosis not present

## 2021-11-01 DIAGNOSIS — Z1151 Encounter for screening for human papillomavirus (HPV): Secondary | ICD-10-CM | POA: Diagnosis not present

## 2021-11-01 DIAGNOSIS — Z124 Encounter for screening for malignant neoplasm of cervix: Secondary | ICD-10-CM | POA: Diagnosis not present

## 2021-11-05 DIAGNOSIS — Z03818 Encounter for observation for suspected exposure to other biological agents ruled out: Secondary | ICD-10-CM | POA: Diagnosis not present

## 2021-11-22 DIAGNOSIS — M48062 Spinal stenosis, lumbar region with neurogenic claudication: Secondary | ICD-10-CM | POA: Diagnosis not present

## 2021-11-22 DIAGNOSIS — M4316 Spondylolisthesis, lumbar region: Secondary | ICD-10-CM | POA: Diagnosis not present

## 2021-11-22 DIAGNOSIS — M532X6 Spinal instabilities, lumbar region: Secondary | ICD-10-CM | POA: Diagnosis not present

## 2021-11-22 DIAGNOSIS — Z6829 Body mass index (BMI) 29.0-29.9, adult: Secondary | ICD-10-CM | POA: Diagnosis not present

## 2021-12-27 DIAGNOSIS — Z6829 Body mass index (BMI) 29.0-29.9, adult: Secondary | ICD-10-CM | POA: Diagnosis not present

## 2021-12-27 DIAGNOSIS — M532X6 Spinal instabilities, lumbar region: Secondary | ICD-10-CM | POA: Diagnosis not present

## 2021-12-27 DIAGNOSIS — M48062 Spinal stenosis, lumbar region with neurogenic claudication: Secondary | ICD-10-CM | POA: Diagnosis not present

## 2021-12-27 DIAGNOSIS — M4316 Spondylolisthesis, lumbar region: Secondary | ICD-10-CM | POA: Diagnosis not present

## 2022-01-03 DIAGNOSIS — Z01812 Encounter for preprocedural laboratory examination: Secondary | ICD-10-CM | POA: Diagnosis not present

## 2022-01-03 DIAGNOSIS — M48062 Spinal stenosis, lumbar region with neurogenic claudication: Secondary | ICD-10-CM | POA: Diagnosis not present

## 2022-01-03 DIAGNOSIS — Z01810 Encounter for preprocedural cardiovascular examination: Secondary | ICD-10-CM | POA: Diagnosis not present

## 2022-01-07 DIAGNOSIS — M5116 Intervertebral disc disorders with radiculopathy, lumbar region: Secondary | ICD-10-CM | POA: Diagnosis not present

## 2022-01-07 DIAGNOSIS — M47816 Spondylosis without myelopathy or radiculopathy, lumbar region: Secondary | ICD-10-CM | POA: Diagnosis not present

## 2022-01-07 DIAGNOSIS — M48062 Spinal stenosis, lumbar region with neurogenic claudication: Secondary | ICD-10-CM | POA: Diagnosis not present

## 2022-01-07 DIAGNOSIS — M532X6 Spinal instabilities, lumbar region: Secondary | ICD-10-CM | POA: Diagnosis not present

## 2022-01-07 DIAGNOSIS — M5106 Intervertebral disc disorders with myelopathy, lumbar region: Secondary | ICD-10-CM | POA: Diagnosis not present

## 2022-01-07 DIAGNOSIS — Z981 Arthrodesis status: Secondary | ICD-10-CM | POA: Diagnosis not present

## 2022-01-07 DIAGNOSIS — M7138 Other bursal cyst, other site: Secondary | ICD-10-CM | POA: Diagnosis not present

## 2022-01-07 DIAGNOSIS — M4316 Spondylolisthesis, lumbar region: Secondary | ICD-10-CM | POA: Diagnosis not present

## 2022-01-07 DIAGNOSIS — M4326 Fusion of spine, lumbar region: Secondary | ICD-10-CM | POA: Diagnosis not present

## 2022-01-07 DIAGNOSIS — M545 Low back pain, unspecified: Secondary | ICD-10-CM | POA: Diagnosis not present

## 2022-01-08 DIAGNOSIS — M48062 Spinal stenosis, lumbar region with neurogenic claudication: Secondary | ICD-10-CM | POA: Diagnosis not present

## 2022-01-08 DIAGNOSIS — M545 Low back pain, unspecified: Secondary | ICD-10-CM | POA: Diagnosis not present

## 2022-01-08 DIAGNOSIS — M4326 Fusion of spine, lumbar region: Secondary | ICD-10-CM | POA: Diagnosis not present

## 2022-01-08 DIAGNOSIS — M7138 Other bursal cyst, other site: Secondary | ICD-10-CM | POA: Diagnosis not present

## 2022-01-08 DIAGNOSIS — M47816 Spondylosis without myelopathy or radiculopathy, lumbar region: Secondary | ICD-10-CM | POA: Diagnosis not present

## 2022-01-08 DIAGNOSIS — Z981 Arthrodesis status: Secondary | ICD-10-CM | POA: Diagnosis not present

## 2022-01-08 DIAGNOSIS — M5116 Intervertebral disc disorders with radiculopathy, lumbar region: Secondary | ICD-10-CM | POA: Diagnosis not present

## 2022-01-08 DIAGNOSIS — M4316 Spondylolisthesis, lumbar region: Secondary | ICD-10-CM | POA: Diagnosis not present

## 2022-02-07 DIAGNOSIS — M48062 Spinal stenosis, lumbar region with neurogenic claudication: Secondary | ICD-10-CM | POA: Diagnosis not present

## 2022-02-20 DIAGNOSIS — M5418 Radiculopathy, sacral and sacrococcygeal region: Secondary | ICD-10-CM | POA: Diagnosis not present

## 2022-02-20 DIAGNOSIS — Z4789 Encounter for other orthopedic aftercare: Secondary | ICD-10-CM | POA: Diagnosis not present

## 2022-02-20 DIAGNOSIS — M48062 Spinal stenosis, lumbar region with neurogenic claudication: Secondary | ICD-10-CM | POA: Diagnosis not present

## 2022-02-20 DIAGNOSIS — M4316 Spondylolisthesis, lumbar region: Secondary | ICD-10-CM | POA: Diagnosis not present

## 2022-03-08 DIAGNOSIS — Z981 Arthrodesis status: Secondary | ICD-10-CM | POA: Diagnosis not present

## 2022-03-08 DIAGNOSIS — M4326 Fusion of spine, lumbar region: Secondary | ICD-10-CM | POA: Diagnosis not present

## 2022-03-08 DIAGNOSIS — M48062 Spinal stenosis, lumbar region with neurogenic claudication: Secondary | ICD-10-CM | POA: Diagnosis not present

## 2022-03-08 DIAGNOSIS — M4316 Spondylolisthesis, lumbar region: Secondary | ICD-10-CM | POA: Diagnosis not present

## 2022-04-03 DIAGNOSIS — M47816 Spondylosis without myelopathy or radiculopathy, lumbar region: Secondary | ICD-10-CM | POA: Diagnosis not present

## 2022-05-02 DIAGNOSIS — Z6829 Body mass index (BMI) 29.0-29.9, adult: Secondary | ICD-10-CM | POA: Diagnosis not present

## 2022-05-02 DIAGNOSIS — M4326 Fusion of spine, lumbar region: Secondary | ICD-10-CM | POA: Diagnosis not present

## 2022-05-02 DIAGNOSIS — M48062 Spinal stenosis, lumbar region with neurogenic claudication: Secondary | ICD-10-CM | POA: Diagnosis not present

## 2022-05-02 DIAGNOSIS — M545 Low back pain, unspecified: Secondary | ICD-10-CM | POA: Diagnosis not present

## 2022-05-13 DIAGNOSIS — E785 Hyperlipidemia, unspecified: Secondary | ICD-10-CM | POA: Diagnosis not present

## 2022-05-13 DIAGNOSIS — F419 Anxiety disorder, unspecified: Secondary | ICD-10-CM | POA: Diagnosis not present

## 2022-05-13 DIAGNOSIS — N1831 Chronic kidney disease, stage 3a: Secondary | ICD-10-CM | POA: Diagnosis not present

## 2022-05-13 DIAGNOSIS — M48061 Spinal stenosis, lumbar region without neurogenic claudication: Secondary | ICD-10-CM | POA: Diagnosis not present

## 2022-05-13 DIAGNOSIS — Z1331 Encounter for screening for depression: Secondary | ICD-10-CM | POA: Diagnosis not present

## 2022-05-13 DIAGNOSIS — Z0000 Encounter for general adult medical examination without abnormal findings: Secondary | ICD-10-CM | POA: Diagnosis not present

## 2022-05-13 DIAGNOSIS — I700 Atherosclerosis of aorta: Secondary | ICD-10-CM | POA: Diagnosis not present

## 2022-05-13 DIAGNOSIS — I2510 Atherosclerotic heart disease of native coronary artery without angina pectoris: Secondary | ICD-10-CM | POA: Diagnosis not present

## 2022-05-13 DIAGNOSIS — G894 Chronic pain syndrome: Secondary | ICD-10-CM | POA: Diagnosis not present

## 2022-05-13 DIAGNOSIS — I10 Essential (primary) hypertension: Secondary | ICD-10-CM | POA: Diagnosis not present

## 2022-05-13 DIAGNOSIS — R7303 Prediabetes: Secondary | ICD-10-CM | POA: Diagnosis not present

## 2022-05-30 DIAGNOSIS — Z6829 Body mass index (BMI) 29.0-29.9, adult: Secondary | ICD-10-CM | POA: Diagnosis not present

## 2022-05-30 DIAGNOSIS — M545 Low back pain, unspecified: Secondary | ICD-10-CM | POA: Diagnosis not present

## 2022-05-30 DIAGNOSIS — M48062 Spinal stenosis, lumbar region with neurogenic claudication: Secondary | ICD-10-CM | POA: Diagnosis not present

## 2022-05-30 DIAGNOSIS — M4326 Fusion of spine, lumbar region: Secondary | ICD-10-CM | POA: Diagnosis not present

## 2022-07-04 DIAGNOSIS — M4326 Fusion of spine, lumbar region: Secondary | ICD-10-CM | POA: Diagnosis not present

## 2022-07-04 DIAGNOSIS — M7061 Trochanteric bursitis, right hip: Secondary | ICD-10-CM | POA: Diagnosis not present

## 2022-07-04 DIAGNOSIS — G894 Chronic pain syndrome: Secondary | ICD-10-CM | POA: Diagnosis not present

## 2022-07-04 DIAGNOSIS — M48062 Spinal stenosis, lumbar region with neurogenic claudication: Secondary | ICD-10-CM | POA: Diagnosis not present

## 2022-07-04 DIAGNOSIS — Z79899 Other long term (current) drug therapy: Secondary | ICD-10-CM | POA: Diagnosis not present

## 2022-07-04 DIAGNOSIS — M791 Myalgia, unspecified site: Secondary | ICD-10-CM | POA: Diagnosis not present

## 2022-07-04 DIAGNOSIS — Z79891 Long term (current) use of opiate analgesic: Secondary | ICD-10-CM | POA: Diagnosis not present

## 2022-08-01 DIAGNOSIS — M545 Low back pain, unspecified: Secondary | ICD-10-CM | POA: Diagnosis not present

## 2022-08-01 DIAGNOSIS — M4326 Fusion of spine, lumbar region: Secondary | ICD-10-CM | POA: Diagnosis not present

## 2022-08-01 DIAGNOSIS — M48062 Spinal stenosis, lumbar region with neurogenic claudication: Secondary | ICD-10-CM | POA: Diagnosis not present

## 2022-08-29 DIAGNOSIS — M545 Low back pain, unspecified: Secondary | ICD-10-CM | POA: Diagnosis not present

## 2022-08-29 DIAGNOSIS — M4326 Fusion of spine, lumbar region: Secondary | ICD-10-CM | POA: Diagnosis not present

## 2022-08-29 DIAGNOSIS — M48062 Spinal stenosis, lumbar region with neurogenic claudication: Secondary | ICD-10-CM | POA: Diagnosis not present

## 2022-08-30 ENCOUNTER — Other Ambulatory Visit: Payer: Self-pay | Admitting: Orthopaedic Surgery

## 2022-08-30 DIAGNOSIS — M5416 Radiculopathy, lumbar region: Secondary | ICD-10-CM

## 2022-08-30 DIAGNOSIS — M4326 Fusion of spine, lumbar region: Secondary | ICD-10-CM

## 2022-09-02 ENCOUNTER — Encounter: Payer: Self-pay | Admitting: Orthopaedic Surgery

## 2022-09-03 ENCOUNTER — Other Ambulatory Visit: Payer: Medicare HMO

## 2022-10-02 ENCOUNTER — Ambulatory Visit
Admission: RE | Admit: 2022-10-02 | Discharge: 2022-10-02 | Disposition: A | Discharge status: Home / Self Care | Payer: Medicare HMO | Source: Ambulatory Visit | Attending: Orthopaedic Surgery | Admitting: Orthopaedic Surgery

## 2022-10-02 DIAGNOSIS — M5416 Radiculopathy, lumbar region: Secondary | ICD-10-CM

## 2022-10-02 DIAGNOSIS — M4326 Fusion of spine, lumbar region: Secondary | ICD-10-CM

## 2022-10-02 DIAGNOSIS — M5127 Other intervertebral disc displacement, lumbosacral region: Secondary | ICD-10-CM | POA: Diagnosis not present

## 2022-10-02 DIAGNOSIS — M48061 Spinal stenosis, lumbar region without neurogenic claudication: Secondary | ICD-10-CM | POA: Diagnosis not present

## 2022-10-03 DIAGNOSIS — M48062 Spinal stenosis, lumbar region with neurogenic claudication: Secondary | ICD-10-CM | POA: Diagnosis not present

## 2022-10-03 DIAGNOSIS — M4326 Fusion of spine, lumbar region: Secondary | ICD-10-CM | POA: Diagnosis not present

## 2022-10-03 DIAGNOSIS — M545 Low back pain, unspecified: Secondary | ICD-10-CM | POA: Diagnosis not present

## 2022-10-03 DIAGNOSIS — M5106 Intervertebral disc disorders with myelopathy, lumbar region: Secondary | ICD-10-CM | POA: Diagnosis not present

## 2022-10-31 DIAGNOSIS — M48062 Spinal stenosis, lumbar region with neurogenic claudication: Secondary | ICD-10-CM | POA: Diagnosis not present

## 2022-10-31 DIAGNOSIS — M545 Low back pain, unspecified: Secondary | ICD-10-CM | POA: Diagnosis not present

## 2022-10-31 DIAGNOSIS — M4326 Fusion of spine, lumbar region: Secondary | ICD-10-CM | POA: Diagnosis not present

## 2022-11-11 DIAGNOSIS — Z1231 Encounter for screening mammogram for malignant neoplasm of breast: Secondary | ICD-10-CM | POA: Diagnosis not present

## 2022-11-11 DIAGNOSIS — Z124 Encounter for screening for malignant neoplasm of cervix: Secondary | ICD-10-CM | POA: Diagnosis not present

## 2022-11-11 DIAGNOSIS — Z6830 Body mass index (BMI) 30.0-30.9, adult: Secondary | ICD-10-CM | POA: Diagnosis not present

## 2022-11-11 DIAGNOSIS — Z1151 Encounter for screening for human papillomavirus (HPV): Secondary | ICD-10-CM | POA: Diagnosis not present

## 2022-11-26 DIAGNOSIS — M48062 Spinal stenosis, lumbar region with neurogenic claudication: Secondary | ICD-10-CM | POA: Diagnosis not present

## 2022-11-28 ENCOUNTER — Encounter (HOSPITAL_COMMUNITY): Payer: Self-pay

## 2022-11-28 ENCOUNTER — Ambulatory Visit (HOSPITAL_COMMUNITY)
Admission: EM | Admit: 2022-11-28 | Discharge: 2022-11-28 | Disposition: A | Discharge status: Home / Self Care | Payer: Medicare HMO | Attending: Internal Medicine | Admitting: Internal Medicine

## 2022-11-28 DIAGNOSIS — W19XXXA Unspecified fall, initial encounter: Secondary | ICD-10-CM | POA: Diagnosis not present

## 2022-11-28 DIAGNOSIS — M79641 Pain in right hand: Secondary | ICD-10-CM | POA: Diagnosis not present

## 2022-11-28 DIAGNOSIS — Z23 Encounter for immunization: Secondary | ICD-10-CM | POA: Diagnosis not present

## 2022-11-28 DIAGNOSIS — S80211A Abrasion, right knee, initial encounter: Secondary | ICD-10-CM

## 2022-11-28 DIAGNOSIS — S161XXA Strain of muscle, fascia and tendon at neck level, initial encounter: Secondary | ICD-10-CM | POA: Diagnosis not present

## 2022-11-28 MED ORDER — TETANUS-DIPHTH-ACELL PERTUSSIS 5-2.5-18.5 LF-MCG/0.5 IM SUSY
0.5000 mL | PREFILLED_SYRINGE | Freq: Once | INTRAMUSCULAR | Status: AC
  Administered 2022-11-28: 0.5 mL via INTRAMUSCULAR

## 2022-11-28 MED ORDER — METHOCARBAMOL 500 MG PO TABS: 500.0000 mg | ORAL_TABLET | Freq: Two times a day (BID) | ORAL | 0 refills | Status: DC

## 2022-11-28 MED ORDER — TETANUS-DIPHTH-ACELL PERTUSSIS 5-2.5-18.5 LF-MCG/0.5 IM SUSY
PREFILLED_SYRINGE | INTRAMUSCULAR | Status: AC
  Filled 2022-11-28: qty 0.5

## 2022-11-28 NOTE — Discharge Instructions (Addendum)
You will likely experience muscle aches and pains related to this fall for the next few days.  You may take ibuprofen 600 mg every 6 hours as needed for pains.  You may also take Robaxin muscle relaxer every 12 hours as needed for muscle spasm, however this medication can make you drowsy so do do not drive, drink alcohol, or go to work while taking this medication.  You may wear the thumb/wrist brace as needed for compression and stability of the right wrist related to pain.  Apply Aquaphor ointment to the abrasion of your right knee.  If you notice any worsening signs of infection such as worsening redness, swelling, pus drainage, or worsening pain to the knee, please return to clinic for reevaluation.  Follow-up with your orthopedic provider as needed.  I updated your tetanus shot in the clinic today.  If you develop any new or worsening symptoms or do not improve in the next 2 to 3 days, please return.  If your symptoms are severe, please go to the emergency room.  Follow-up with your primary care provider for further evaluation and management of your symptoms as well as ongoing wellness visits.  I hope you feel better!
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
## 2022-11-28 NOTE — ED Triage Notes (Signed)
Pt reports a slip and fall yesterday. Pt reports fallen forward onto a rug. Pt reports right knee pain, neck pain and back pain.

## 2022-12-03 DIAGNOSIS — G894 Chronic pain syndrome: Secondary | ICD-10-CM | POA: Diagnosis not present

## 2022-12-03 DIAGNOSIS — M5106 Intervertebral disc disorders with myelopathy, lumbar region: Secondary | ICD-10-CM | POA: Diagnosis not present

## 2022-12-03 DIAGNOSIS — M48062 Spinal stenosis, lumbar region with neurogenic claudication: Secondary | ICD-10-CM | POA: Diagnosis not present

## 2023-02-03 DIAGNOSIS — Z79891 Long term (current) use of opiate analgesic: Secondary | ICD-10-CM | POA: Diagnosis not present

## 2023-02-03 DIAGNOSIS — M48062 Spinal stenosis, lumbar region with neurogenic claudication: Secondary | ICD-10-CM | POA: Diagnosis not present

## 2023-02-03 DIAGNOSIS — G894 Chronic pain syndrome: Secondary | ICD-10-CM | POA: Diagnosis not present

## 2023-02-03 DIAGNOSIS — Z79899 Other long term (current) drug therapy: Secondary | ICD-10-CM | POA: Diagnosis not present

## 2023-03-06 DIAGNOSIS — M48062 Spinal stenosis, lumbar region with neurogenic claudication: Secondary | ICD-10-CM | POA: Diagnosis not present

## 2023-03-21 DIAGNOSIS — H5213 Myopia, bilateral: Secondary | ICD-10-CM | POA: Diagnosis not present

## 2023-03-24 DIAGNOSIS — M48062 Spinal stenosis, lumbar region with neurogenic claudication: Secondary | ICD-10-CM | POA: Diagnosis not present

## 2023-04-04 DIAGNOSIS — Z0100 Encounter for examination of eyes and vision without abnormal findings: Secondary | ICD-10-CM | POA: Diagnosis not present

## 2023-04-15 DIAGNOSIS — M545 Low back pain, unspecified: Secondary | ICD-10-CM | POA: Diagnosis not present

## 2023-04-15 DIAGNOSIS — M48062 Spinal stenosis, lumbar region with neurogenic claudication: Secondary | ICD-10-CM | POA: Diagnosis not present

## 2023-05-18 ENCOUNTER — Ambulatory Visit (INDEPENDENT_AMBULATORY_CARE_PROVIDER_SITE_OTHER): Payer: Medicare HMO

## 2023-05-18 ENCOUNTER — Ambulatory Visit
Admission: EM | Admit: 2023-05-18 | Discharge: 2023-05-18 | Disposition: A | Discharge status: Home / Self Care | Payer: Medicare HMO | Attending: Internal Medicine | Admitting: Internal Medicine

## 2023-05-18 DIAGNOSIS — M799 Soft tissue disorder, unspecified: Secondary | ICD-10-CM | POA: Diagnosis not present

## 2023-05-18 DIAGNOSIS — M7989 Other specified soft tissue disorders: Secondary | ICD-10-CM | POA: Diagnosis not present

## 2023-05-18 DIAGNOSIS — M1990 Unspecified osteoarthritis, unspecified site: Secondary | ICD-10-CM | POA: Diagnosis not present

## 2023-05-18 DIAGNOSIS — M79642 Pain in left hand: Secondary | ICD-10-CM | POA: Diagnosis not present

## 2023-05-18 DIAGNOSIS — M19042 Primary osteoarthritis, left hand: Secondary | ICD-10-CM | POA: Diagnosis not present

## 2023-05-18 DIAGNOSIS — M25542 Pain in joints of left hand: Secondary | ICD-10-CM | POA: Diagnosis not present

## 2023-05-18 MED ORDER — PREDNISONE 20 MG PO TABS
40.0000 mg | ORAL_TABLET | Freq: Every day | ORAL | 0 refills | Status: AC
Start: 2023-05-18 — End: 2023-05-23
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
## 2023-05-18 NOTE — Discharge Instructions (Addendum)
Start prednisone daily for 5 days.  Please follow-up with your PCP in 2 days for recheck.  Please go to the ER for any worsening symptoms.  I hope you feel better soon!

## 2023-05-18 NOTE — ED Triage Notes (Signed)
Patient presents with left hand swelling and throbbing pain x day 3. Treated with Motrin. No injury that she is aware of.

## 2023-05-19 DIAGNOSIS — M79642 Pain in left hand: Secondary | ICD-10-CM | POA: Diagnosis not present

## 2023-05-19 DIAGNOSIS — M5106 Intervertebral disc disorders with myelopathy, lumbar region: Secondary | ICD-10-CM | POA: Diagnosis not present

## 2023-05-19 DIAGNOSIS — M545 Low back pain, unspecified: Secondary | ICD-10-CM | POA: Diagnosis not present

## 2023-05-19 DIAGNOSIS — M48062 Spinal stenosis, lumbar region with neurogenic claudication: Secondary | ICD-10-CM | POA: Diagnosis not present

## 2023-05-23 ENCOUNTER — Other Ambulatory Visit: Payer: Self-pay

## 2023-05-23 ENCOUNTER — Encounter (HOSPITAL_COMMUNITY): Payer: Self-pay | Admitting: *Deleted

## 2023-05-23 ENCOUNTER — Ambulatory Visit (HOSPITAL_COMMUNITY)
Admission: EM | Admit: 2023-05-23 | Discharge: 2023-05-23 | Disposition: A | Discharge status: Home / Self Care | Payer: Medicare HMO | Attending: Family Medicine | Admitting: Family Medicine

## 2023-05-23 DIAGNOSIS — L03114 Cellulitis of left upper limb: Secondary | ICD-10-CM | POA: Diagnosis not present

## 2023-05-23 DIAGNOSIS — M109 Gout, unspecified: Secondary | ICD-10-CM

## 2023-05-23 DIAGNOSIS — M79642 Pain in left hand: Secondary | ICD-10-CM | POA: Diagnosis not present

## 2023-05-23 HISTORY — DX: Gout, unspecified: M10.9

## 2023-05-23 MED ORDER — AMOXICILLIN-POT CLAVULANATE 875-125 MG PO TABS: 1.0000 | ORAL_TABLET | Freq: Two times a day (BID) | ORAL | 0 refills | Status: AC

## 2023-05-23 MED ORDER — KETOROLAC TROMETHAMINE 30 MG/ML IJ SOLN
30.0000 mg | Freq: Once | INTRAMUSCULAR | Status: AC
  Administered 2023-05-23: 30 mg via INTRAMUSCULAR

## 2023-05-23 MED ORDER — KETOROLAC TROMETHAMINE 30 MG/ML IJ SOLN
INTRAMUSCULAR | Status: AC
  Filled 2023-05-23: qty 1

## 2023-05-23 MED ORDER — KETOROLAC TROMETHAMINE 10 MG PO TABS: 10.0000 mg | ORAL_TABLET | Freq: Four times a day (QID) | ORAL | 0 refills | Status: AC | PRN

## 2023-05-23 NOTE — ED Triage Notes (Signed)
Pt reports Lt hand pain with swelling. Pt went to [facility] and to PCP . Pt was treated with steroids and pain med. Hand has not improved.
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
## 2023-05-23 NOTE — ED Notes (Signed)
Unable to obtain blood work due to poor access. DR Marlinda Mike notified . Pt to return SAT after she has had fluids .

## 2023-05-23 NOTE — Discharge Instructions (Addendum)
Take amoxicillin-clavulanate 875 mg--1 tab twice daily with food for 7 days  You have been given a shot of Toradol 30 mg today.  Ketorolac 10 mg tablets--take 1 tablet every 6 hours as needed for pain.  This is the same medicine that is in the shot we just gave you  If you worsen anyway, or if you do not improve in the next 24 hours, please go to the emergency room for further evaluation

## 2023-05-24 ENCOUNTER — Ambulatory Visit (HOSPITAL_COMMUNITY)
Admission: EM | Admit: 2023-05-24 | Discharge: 2023-05-24 | Disposition: A | Discharge status: Home / Self Care | Payer: Medicare HMO | Attending: Emergency Medicine | Admitting: Emergency Medicine

## 2023-05-24 DIAGNOSIS — L03114 Cellulitis of left upper limb: Secondary | ICD-10-CM | POA: Diagnosis not present

## 2023-05-24 DIAGNOSIS — M79642 Pain in left hand: Secondary | ICD-10-CM | POA: Insufficient documentation

## 2023-05-24 DIAGNOSIS — M7989 Other specified soft tissue disorders: Secondary | ICD-10-CM | POA: Diagnosis not present

## 2023-05-24 LAB — CBC WITH DIFFERENTIAL/PLATELET
Abs Immature Granulocytes: 0.05 10*3/uL (ref 0.00–0.07)
Basophils Absolute: 0.1 10*3/uL (ref 0.0–0.1)
Basophils Relative: 1 %
Eosinophils Absolute: 0.4 10*3/uL (ref 0.0–0.5)
Eosinophils Relative: 4 %
HCT: 37.5 % (ref 36.0–46.0)
Hemoglobin: 12.2 g/dL (ref 12.0–15.0)
Immature Granulocytes: 1 %
Lymphocytes Relative: 36 %
Lymphs Abs: 3.8 10*3/uL (ref 0.7–4.0)
MCH: 27.1 pg (ref 26.0–34.0)
MCHC: 32.5 g/dL (ref 30.0–36.0)
MCV: 83.1 fL (ref 80.0–100.0)
Monocytes Absolute: 0.7 10*3/uL (ref 0.1–1.0)
Monocytes Relative: 6 %
Neutro Abs: 5.6 10*3/uL (ref 1.7–7.7)
Neutrophils Relative %: 52 %
Platelets: 297 10*3/uL (ref 150–400)
RBC: 4.51 MIL/uL (ref 3.87–5.11)
RDW: 12.9 % (ref 11.5–15.5)
WBC: 10.6 10*3/uL — ABNORMAL HIGH (ref 4.0–10.5)
nRBC: 0 % (ref 0.0–0.2)

## 2023-05-24 LAB — SEDIMENTATION RATE: Sed Rate: 25 mm/h — ABNORMAL HIGH (ref 0–22)

## 2023-05-24 LAB — URIC ACID: Uric Acid, Serum: 11.6 mg/dL — ABNORMAL HIGH (ref 2.5–7.1)

## 2023-05-25 ENCOUNTER — Encounter: Payer: Self-pay | Admitting: Emergency Medicine

## 2023-05-25 ENCOUNTER — Telehealth: Payer: Self-pay | Admitting: Emergency Medicine

## 2023-05-25 MED ORDER — COLCHICINE 0.6 MG PO TABS: ORAL_TABLET | ORAL | 0 refills | Status: AC

## 2023-05-25 NOTE — Telephone Encounter (Signed)
Patient was seen at urgent care on 05/18/2023 and again on 05/23/2023 with a chief complaint of persistent pain and swelling of her left second and third fingers.  At her visit on 05/18/2023 she was prescribed prednisone 40 mg daily for 5 days which did not resolve her symptoms.  Patient returned to a different urgent care location for further evaluation where she was tested for gout given her strong family history for gout in her grandmother and brother.  Patient was provided with prescription for Augmentin for possible cellulitis while awaiting results of her uric acid level which was 11.6.   After further review of her electronic medical record, patient presented to the emergency room with similar complaints of acute onset of persistent pain and swelling in her left hand and her right foot, uric acid levels were never checked.  Patient will be started on colchicine given that she has not responded well to prednisone in the past.

## 2023-05-28 LAB — RHEUMATOID FACTOR: Rheumatoid fact SerPl-aCnc: <10 [IU]/mL (ref <14.0)

## 2023-06-03 DIAGNOSIS — I10 Essential (primary) hypertension: Secondary | ICD-10-CM | POA: Diagnosis not present

## 2023-06-03 DIAGNOSIS — M109 Gout, unspecified: Secondary | ICD-10-CM | POA: Diagnosis not present

## 2023-06-03 DIAGNOSIS — N1831 Chronic kidney disease, stage 3a: Secondary | ICD-10-CM | POA: Diagnosis not present

## 2023-06-03 DIAGNOSIS — I700 Atherosclerosis of aorta: Secondary | ICD-10-CM | POA: Diagnosis not present

## 2023-06-03 DIAGNOSIS — Z8659 Personal history of other mental and behavioral disorders: Secondary | ICD-10-CM | POA: Diagnosis not present

## 2023-07-09 DIAGNOSIS — E790 Hyperuricemia without signs of inflammatory arthritis and tophaceous disease: Secondary | ICD-10-CM | POA: Diagnosis not present

## 2023-07-09 DIAGNOSIS — M722 Plantar fascial fibromatosis: Secondary | ICD-10-CM | POA: Diagnosis not present

## 2023-07-13 ENCOUNTER — Emergency Department (HOSPITAL_BASED_OUTPATIENT_CLINIC_OR_DEPARTMENT_OTHER): Payer: Medicare HMO

## 2023-07-13 ENCOUNTER — Emergency Department (HOSPITAL_COMMUNITY)
Admission: EM | Admit: 2023-07-13 | Discharge: 2023-07-13 | Disposition: A | Discharge status: Home / Self Care | Payer: Medicare HMO | Attending: Emergency Medicine | Admitting: Emergency Medicine

## 2023-07-13 ENCOUNTER — Other Ambulatory Visit: Payer: Self-pay

## 2023-07-13 ENCOUNTER — Emergency Department (HOSPITAL_COMMUNITY): Payer: Medicare HMO

## 2023-07-13 ENCOUNTER — Encounter (HOSPITAL_COMMUNITY): Payer: Self-pay | Admitting: *Deleted

## 2023-07-13 DIAGNOSIS — R079 Chest pain, unspecified: Secondary | ICD-10-CM | POA: Insufficient documentation

## 2023-07-13 DIAGNOSIS — I10 Essential (primary) hypertension: Secondary | ICD-10-CM | POA: Diagnosis not present

## 2023-07-13 DIAGNOSIS — R0789 Other chest pain: Secondary | ICD-10-CM | POA: Diagnosis not present

## 2023-07-13 DIAGNOSIS — L03116 Cellulitis of left lower limb: Secondary | ICD-10-CM | POA: Diagnosis not present

## 2023-07-13 DIAGNOSIS — M7989 Other specified soft tissue disorders: Secondary | ICD-10-CM | POA: Diagnosis not present

## 2023-07-13 DIAGNOSIS — M542 Cervicalgia: Secondary | ICD-10-CM | POA: Diagnosis not present

## 2023-07-13 DIAGNOSIS — Z79899 Other long term (current) drug therapy: Secondary | ICD-10-CM | POA: Insufficient documentation

## 2023-07-13 DIAGNOSIS — R0602 Shortness of breath: Secondary | ICD-10-CM | POA: Insufficient documentation

## 2023-07-13 DIAGNOSIS — M79672 Pain in left foot: Secondary | ICD-10-CM | POA: Diagnosis not present

## 2023-07-13 DIAGNOSIS — M25572 Pain in left ankle and joints of left foot: Secondary | ICD-10-CM | POA: Diagnosis not present

## 2023-07-13 LAB — CBC
HCT: 37.6 % (ref 36.0–46.0)
Hemoglobin: 11.6 g/dL — ABNORMAL LOW (ref 12.0–15.0)
MCH: 27.3 pg (ref 26.0–34.0)
MCHC: 30.9 g/dL (ref 30.0–36.0)
MCV: 88.5 fL (ref 80.0–100.0)
Platelets: 297 10*3/uL (ref 150–400)
RBC: 4.25 MIL/uL (ref 3.87–5.11)
RDW: 12.4 % (ref 11.5–15.5)
WBC: 9.1 10*3/uL (ref 4.0–10.5)
nRBC: 0.2 % (ref 0.0–0.2)

## 2023-07-13 LAB — BASIC METABOLIC PANEL
Anion gap: 11 (ref 5–15)
BUN: 16 mg/dL (ref 6–20)
CO2: 24 mmol/L (ref 22–32)
Calcium: 9.9 mg/dL (ref 8.9–10.3)
Chloride: 104 mmol/L (ref 98–111)
Creatinine, Ser: 1.17 mg/dL — ABNORMAL HIGH (ref 0.44–1.00)
GFR, Estimated: 56 mL/min — ABNORMAL LOW (ref >60)
Glucose, Bld: 112 mg/dL — ABNORMAL HIGH (ref 70–99)
Potassium: 4.3 mmol/L (ref 3.5–5.1)
Sodium: 139 mmol/L (ref 135–145)

## 2023-07-13 LAB — HCG, SERUM, QUALITATIVE: Preg, Serum: NEGATIVE

## 2023-07-13 LAB — TROPONIN I (HIGH SENSITIVITY): Troponin I (High Sensitivity): 4 ng/L (ref <18)

## 2023-07-13 MED ORDER — CEPHALEXIN 250 MG PO CAPS
250.0000 mg | ORAL_CAPSULE | Freq: Once | ORAL | Status: AC
  Administered 2023-07-13: 250 mg via ORAL
  Filled 2023-07-13: qty 1

## 2023-07-13 MED ORDER — LIDOCAINE 5 % EX PTCH
1.0000 | MEDICATED_PATCH | CUTANEOUS | Status: DC
Start: 2023-07-13 — End: 2023-07-13
  Administered 2023-07-13: 1 via TRANSDERMAL
  Filled 2023-07-13: qty 1

## 2023-07-13 MED ORDER — OXYCODONE-ACETAMINOPHEN 5-325 MG PO TABS
2.0000 | ORAL_TABLET | Freq: Once | ORAL | Status: AC
  Administered 2023-07-13: 2 via ORAL
  Filled 2023-07-13: qty 2

## 2023-07-13 MED ORDER — IBUPROFEN 800 MG PO TABS
800.0000 mg | ORAL_TABLET | Freq: Once | ORAL | Status: AC
  Administered 2023-07-13: 800 mg via ORAL
  Filled 2023-07-13: qty 1

## 2023-07-13 MED ORDER — CEPHALEXIN 250 MG PO CAPS: 250.0000 mg | ORAL_CAPSULE | Freq: Four times a day (QID) | ORAL | 0 refills | Status: DC

## 2023-07-13 MED ORDER — CEPHALEXIN 250 MG PO CAPS: 250.0000 mg | ORAL_CAPSULE | Freq: Four times a day (QID) | ORAL | 0 refills | Status: AC

## 2023-07-13 NOTE — Progress Notes (Signed)
 VASCULAR LAB    Left lower extremity venous duplex has been performed.  See CV proc for preliminary results.  Messaged negative results to Dr. Theresia Lo via secure chat.  Akshita Italiano, RVT 07/13/2023, 4:05 PM

## 2023-07-13 NOTE — Discharge Instructions (Signed)
 You were seen in the emerged apartment for left foot pain The x-ray did not show any broken bones They ultrasound showed no blood clot This is most likely left foot cellulitis (a soft tissue infection) For this reason we gave your first dose of antibiotics here It is importantly pick up the rest of your antibiotics (Keflex ) from [facility] and take as directed for the next 5 days Return to the emerged part for severe pain, uncontrolled fevers or worsening symptoms Continue to take Tylenol  Motrin  at home for pain and use crutches to get around till this is getting better Follow-up with your doctor within 1 week for reevaluation

## 2023-07-13 NOTE — ED Triage Notes (Signed)
 PT states L foot swelling and pain for several days and L chest pain and neck pain since last night.  Also c/o sob with exertion.  L foot swollen and painful on palpation.

## 2023-07-13 NOTE — ED Provider Notes (Signed)
 [place] [facility] Provider Note   CSN: [id number] Arrival date & time: 07/13/23  1132     History  Chief Complaint  Patient presents with   Foot Pain   Shortness of Breath    Taylor Bowman is a 53 y.o. female.  Patient is a 53 year old female with a past medical history of hypertension, anxiety and gout presenting to the emergency department with left foot pain and chest pain.  The patient states that 3 days ago she started to develop pain in her left foot.  She states that since then she has had the pain and started develop swelling going up her right ankle and leg.  She states that it is felt warm to touch.  She states that she did have a fever at home of 102.  She denies any trauma or falls.  She states that yesterday she started to develop some left-sided chest pain that went into the left side of her neck and shoulder.  She states it has been coming and going since then.  She states that it feels like anxiety related pain as she is feeling anxious related to her foot.  She denies any history of blood clots, any recent hospitalization or surgery, no recent long travel in the car or plane, hormone use or cancer history.  She states that she is currently chest pain-free.  The history is provided by the patient.  Foot Pain Associated symptoms include shortness of breath.  Shortness of Breath      Home Medications Prior to Admission medications   Medication Sig Start Date End Date Taking? Authorizing Provider  ALPRAZolam  (XANAX ) 0.5 MG tablet Take 1 tablet (0.5 mg total) by mouth daily as needed for anxiety. 07/16/21   Akula, Vijaya, MD  atorvastatin  (LIPITOR) 40 MG tablet Take 1 tablet (40 mg total) by mouth daily at 6 PM. Patient taking differently: Take 40 mg by mouth daily with supper. 10/20/14   Margarie Cutter, MD  colchicine  0.6 MG tablet Take 2 tablets now.  Take 1 tablet 1 hour after the first 2 tablets.  Starting tomorrow, take 1 tablet  every 12 hours until your symptoms have resolved. 05/25/23   Joesph Shaver Scales, PA-C  diphenhydrAMINE  (BENADRYL ) 25 MG tablet Take 25 mg by mouth daily as needed for itching (seasonal allergies).    [provider]  EPINEPHrine (EPIPEN 2-PAK) 0.3 mg/0.3 mL IJ SOAJ injection Inject 0.3 mg into the muscle as needed for anaphylaxis.    [provider]  fentaNYL  (DURAGESIC  - DOSED MCG/HR) 12 MCG/HR Place 12 mcg onto the skin every 3 (three) days.     [provider]  gabapentin  (NEURONTIN ) 300 MG capsule Take 300-600 mg by mouth See admin instructions. Take one capsule (300 mg) by mouth every morning and mid-afternoon, take two capsules (600 mg) at bedtime    [provider]  ibuprofen  (ADVIL ) 200 MG tablet Take 400 mg by mouth daily as needed (pain).    [provider]  ketorolac  (TORADOL ) 10 MG tablet Take 1 tablet (10 mg total) by mouth every 6 (six) hours as needed (pain). 05/23/23   Vonna Sharlet POUR, MD  lisinopril -hydrochlorothiazide  (PRINZIDE ,ZESTORETIC ) 10-12.5 MG per tablet Take 1 tablet by mouth daily.    [provider]  Multiple Vitamin (MULTIVITAMIN) tablet Take 1 tablet by mouth every morning.    [provider]  neomycin -polymyxin b-dexamethasone  (MAXITROL ) 3.5-10000-0.1 OINT Place 1 application into the right eye See admin instructions. Apply  sparingly to right eye three times daily until healed    [provider]  oxyCODONE -acetaminophen  (PERCOCET) 10-325 MG tablet Take 1 tablet by mouth every 6 (six) hours. 02/07/18   [provider]  tiZANidine  (ZANAFLEX ) 2 MG tablet Take 2 mg by mouth 3 (three) times daily as needed for muscle spasms.    [provider]  zolpidem  (AMBIEN ) 10 MG tablet Take 10 mg by mouth at bedtime.    [provider]      Allergies    Bee venom, Codeine, Demerol, and Morphine  and codeine    Review of Systems   Review of Systems  Respiratory:  Positive for  shortness of breath.     Physical Exam Updated Vital Signs BP (!) 143/87 (BP Location: Left Arm)   Pulse 79   Temp 98.2 F (36.8 C) (Oral)   Resp 14   Ht 5' 5 (1.651 m)   Wt 76.7 kg   SpO2 98%   BMI 28.12 kg/m  Physical Exam Vitals and nursing note reviewed.  Constitutional:      General: She is not in acute distress.    Appearance: She is well-developed.  HENT:     Head: Normocephalic and atraumatic.     Mouth/Throat:     Mouth: Mucous membranes are moist.  Eyes:     Extraocular Movements: Extraocular movements intact.  Cardiovascular:     Rate and Rhythm: Normal rate and regular rhythm.     Pulses: Normal pulses.  Pulmonary:     Effort: Pulmonary effort is normal.     Breath sounds: Normal breath sounds.  Chest:     Chest wall: No tenderness.  Abdominal:     Palpations: Abdomen is soft.     Tenderness: There is no abdominal tenderness.  Musculoskeletal:        General: Normal range of motion.     Cervical back: Normal range of motion and neck supple.     Right lower leg: No edema.     Left lower leg: Tenderness (Left midfoot, ankle and posterior calf, ankle flexion/extension intact) present. Edema (Left foot, ankle and lower calf, overlying erythema to posterior ankle and calf with warmth to touch) present.  Skin:    General: Skin is warm and dry.  Neurological:     General: No focal deficit present.     Mental Status: She is alert and oriented to person, place, and time.  Psychiatric:        Mood and Affect: Mood normal.        Behavior: Behavior normal.     ED Results / Procedures / Treatments   Labs (all labs ordered are listed, but only abnormal results are displayed) Labs Reviewed  BASIC METABOLIC PANEL - Abnormal; Notable for the following components:      Result Value   Glucose, Bld 112 (*)    Creatinine, Ser 1.17 (*)    GFR, Estimated 56 (*)    All other components within normal limits  CBC - Abnormal; Notable for the following components:    Hemoglobin 11.6 (*)    All other components within normal limits  HCG, SERUM, QUALITATIVE  TROPONIN I (HIGH SENSITIVITY)    EKG EKG Interpretation Date/Time:  Sunday July 13 2023 13:34:12 EST Ventricular Rate:  76 PR Interval:  154 QRS Duration:  82 QT Interval:  394 QTC Calculation: 443 R Axis:   -28  Text Interpretation: Normal sinus rhythm Normal ECG No significant change since last tracing Confirmed  by Ellouise Fine ([id number]) on 07/13/2023 2:05:11 PM  Radiology DG Ankle Complete Left Result Date: 07/13/2023 CLINICAL DATA:  LEFT ankle pain.  Swelling EXAM: LEFT ANKLE COMPLETE - 3+ VIEW COMPARISON:  None Available. FINDINGS: Ankle mortise intact. The talar dome is normal. No malleolar fracture. The calcaneus is normal. Soft tissue swelling over the medial malleolus IMPRESSION: 1. No fracture or dislocation. 2. Soft tissue swelling over the medial malleolus. Electronically Signed   By: Jackquline Boxer M.D.   On: 07/13/2023 15:10   DG Chest 2 View Result Date: 07/13/2023 CLINICAL DATA:  Left-sided chest pain and shortness of breath. EXAM: CHEST - 2 VIEW COMPARISON:  07/14/2021 FINDINGS: The heart size and mediastinal contours are within normal limits. Both lungs are clear. The visualized skeletal structures are unremarkable. IMPRESSION: No active cardiopulmonary disease. Electronically Signed   By: Norleen DELENA Kil M.D.   On: 07/13/2023 12:52   DG Foot Complete Left Result Date: 07/13/2023 CLINICAL DATA:  Left foot swelling and pain for several days. EXAM: LEFT FOOT - COMPLETE 3+ VIEW COMPARISON:  None Available. FINDINGS: There is no evidence of fracture or dislocation. There is no evidence of arthropathy or other focal bone abnormality. Soft tissues are unremarkable. IMPRESSION: Negative. Electronically Signed   By: Norleen DELENA Kil M.D.   On: 07/13/2023 12:52    Procedures Procedures    Medications Ordered in ED Medications  ibuprofen  (ADVIL ) tablet 800 mg (800 mg Oral Given 07/13/23  1434)  oxyCODONE -acetaminophen  (PERCOCET/ROXICET) 5-325 MG per tablet 2 tablet (2 tablets Oral Given 07/13/23 1434)    ED Course/ Medical Decision Making/ A&P Clinical Course as of 07/13/23 1514  Sun Jul 13, 2023  1513 Soft tissue swelling without bony abnormality on L foot XR. Patient signed out to Dr. Pamella pending DVT study, plan for discharge on antibiotics for cellulitis if DVT study negative. [VK]    Clinical Course User Index [VK] Kingsley, Mishelle Hassan K, DO                                 Medical Decision Making This patient presents to the ED with chief complaint(s) of foot pain, chest pain with pertinent past medical history of hypertension, gout, anxiety which further complicates the presenting complaint. The complaint involves an extensive differential diagnosis and also carries with it a high risk of complications and morbidity.    The differential diagnosis includes fracture, dislocation, cellulitis, ankle ROM intact making septic joint unlikely, considering gout, DVT, ACS, arrhythmia, anemia, pneumonia, pneumothorax, pulmonary edema, pleural effusion, anxiety  Additional history obtained: Additional history obtained from N/A Records reviewed N/A  ED Course and Reassessment: On patient's arrival she is hemodynamically stable in no acute distress.  EKG on arrival showed normal sinus rhythm without acute ischemic changes.  She had labs performed in triage that showed a negative troponin.  Symptoms ongoing since yesterday so single troponin is sufficient, labs are otherwise at baseline.  She had chest and foot x-ray that were within normal range.  Will additionally have left ankle x-ray as well as DVT study.  She was given pain control and will be closely reassessed.  Independent labs interpretation:  The following labs were independently interpreted: within normal range  Independent visualization of imaging: - I independently visualized the following imaging with scope of  interpretation limited to determining acute life threatening conditions related to emergency care: CXR, L foot/ankle XR, which revealed no acute disease  Amount and/or Complexity of Data Reviewed Labs: ordered. Radiology: ordered.  Risk Prescription drug management.          Final Clinical Impression(s) / ED Diagnoses Final diagnoses:  Nonspecific chest pain    Rx / DC Orders ED Discharge Orders     None         Kingsley, Nasreen Goedecke K, DO 07/13/23 1514

## 2023-07-13 NOTE — ED Notes (Signed)
 Patient transported to X-ray

## 2023-07-13 NOTE — ED Provider Notes (Signed)
  Physical Exam  BP (!) 143/87 (BP Location: Left Arm)   Pulse 79   Temp 98.2 F (36.8 C) (Oral)   Resp 14   Ht 5' 5 (1.651 m)   Wt 76.7 kg   SpO2 98%   BMI 28.12 kg/m   Physical Exam Vitals and nursing note reviewed.  HENT:     Head: Normocephalic and atraumatic.  Eyes:     Pupils: Pupils are equal, round, and reactive to light.  Cardiovascular:     Rate and Rhythm: Normal rate and regular rhythm.  Pulmonary:     Effort: Pulmonary effort is normal.     Breath sounds: Normal breath sounds.  Abdominal:     Palpations: Abdomen is soft.     Tenderness: There is no abdominal tenderness.  Musculoskeletal:     Comments: Erythematous edematous left foot most prominent over the dorsum and medial malleolus tender to light palpation  Skin:    General: Skin is warm and dry.  Neurological:     Mental Status: She is alert.  Psychiatric:        Mood and Affect: Mood normal.     Procedures  Procedures  ED Course / MDM   Clinical Course as of 07/13/23 1730  Sun Jul 13, 2023  1513 Soft tissue swelling without bony abnormality on L foot XR. Patient signed out to Dr. Pamella pending DVT study, plan for discharge on antibiotics for cellulitis if DVT study negative. [VK]  1727 No evidence of DVT on ultrasound.  More likely left lower extremity cellulitis.  No indication for admission at this time.  Patient does not have significant comorbidities and has not been on antibiotics previously.  No prior history of MRSA.  Will give first dose of Keflex  and prescribe outpatient course.  Return precautions were discussed with the patient and her daughter in detail that would be worrisome for treatment resistant cellulitis [MP]    Clinical Course User Index [MP] Pamella Ozell LABOR, DO [VK] Kingsley, Victoria K, DO   Medical Decision Making I, Ozell Pamella DO, have assumed care of this patient from the previous provider pending ultrasound DVT study left lower extremity, reevaluation and  disposition  Amount and/or Complexity of Data Reviewed Labs: ordered. Radiology: ordered.  Risk Prescription drug management.   Final diagnosis Left foot cellulitis       Pamella Ozell LABOR, DO 07/13/23 1732

## 2023-07-25 DIAGNOSIS — M545 Low back pain, unspecified: Secondary | ICD-10-CM | POA: Diagnosis not present

## 2023-07-25 DIAGNOSIS — M48062 Spinal stenosis, lumbar region with neurogenic claudication: Secondary | ICD-10-CM | POA: Diagnosis not present

## 2023-08-25 DIAGNOSIS — R69 Illness, unspecified: Secondary | ICD-10-CM | POA: Diagnosis not present

## 2023-10-09 DIAGNOSIS — Z79899 Other long term (current) drug therapy: Secondary | ICD-10-CM | POA: Diagnosis not present

## 2023-10-09 DIAGNOSIS — Z79891 Long term (current) use of opiate analgesic: Secondary | ICD-10-CM | POA: Diagnosis not present

## 2023-10-09 DIAGNOSIS — M545 Low back pain, unspecified: Secondary | ICD-10-CM | POA: Diagnosis not present

## 2023-10-09 DIAGNOSIS — G894 Chronic pain syndrome: Secondary | ICD-10-CM | POA: Diagnosis not present

## 2023-10-09 DIAGNOSIS — M48062 Spinal stenosis, lumbar region with neurogenic claudication: Secondary | ICD-10-CM | POA: Diagnosis not present

## 2023-12-08 DIAGNOSIS — M5441 Lumbago with sciatica, right side: Secondary | ICD-10-CM | POA: Diagnosis not present

## 2023-12-08 DIAGNOSIS — M791 Myalgia, unspecified site: Secondary | ICD-10-CM | POA: Diagnosis not present

## 2023-12-08 DIAGNOSIS — M5442 Lumbago with sciatica, left side: Secondary | ICD-10-CM | POA: Diagnosis not present

## 2023-12-08 DIAGNOSIS — G894 Chronic pain syndrome: Secondary | ICD-10-CM | POA: Diagnosis not present

## 2023-12-08 DIAGNOSIS — Z981 Arthrodesis status: Secondary | ICD-10-CM | POA: Diagnosis not present

## 2023-12-10 DIAGNOSIS — M109 Gout, unspecified: Secondary | ICD-10-CM | POA: Diagnosis not present

## 2023-12-10 DIAGNOSIS — N1831 Chronic kidney disease, stage 3a: Secondary | ICD-10-CM | POA: Diagnosis not present

## 2023-12-18 DIAGNOSIS — Z124 Encounter for screening for malignant neoplasm of cervix: Secondary | ICD-10-CM | POA: Diagnosis not present

## 2023-12-18 DIAGNOSIS — M47896 Other spondylosis, lumbar region: Secondary | ICD-10-CM | POA: Diagnosis not present

## 2023-12-18 DIAGNOSIS — Z13820 Encounter for screening for osteoporosis: Secondary | ICD-10-CM | POA: Diagnosis not present

## 2023-12-18 DIAGNOSIS — Z1231 Encounter for screening mammogram for malignant neoplasm of breast: Secondary | ICD-10-CM | POA: Diagnosis not present

## 2023-12-18 DIAGNOSIS — N924 Excessive bleeding in the premenopausal period: Secondary | ICD-10-CM | POA: Diagnosis not present

## 2023-12-18 DIAGNOSIS — Z1151 Encounter for screening for human papillomavirus (HPV): Secondary | ICD-10-CM | POA: Diagnosis not present

## 2023-12-18 DIAGNOSIS — Z6831 Body mass index (BMI) 31.0-31.9, adult: Secondary | ICD-10-CM | POA: Diagnosis not present

## 2024-01-05 DIAGNOSIS — N1831 Chronic kidney disease, stage 3a: Secondary | ICD-10-CM | POA: Diagnosis not present

## 2024-01-05 DIAGNOSIS — I700 Atherosclerosis of aorta: Secondary | ICD-10-CM | POA: Diagnosis not present

## 2024-01-05 DIAGNOSIS — R7303 Prediabetes: Secondary | ICD-10-CM | POA: Diagnosis not present

## 2024-01-05 DIAGNOSIS — M48061 Spinal stenosis, lumbar region without neurogenic claudication: Secondary | ICD-10-CM | POA: Diagnosis not present

## 2024-01-05 DIAGNOSIS — G894 Chronic pain syndrome: Secondary | ICD-10-CM | POA: Diagnosis not present

## 2024-01-05 DIAGNOSIS — Z0000 Encounter for general adult medical examination without abnormal findings: Secondary | ICD-10-CM | POA: Diagnosis not present

## 2024-01-05 DIAGNOSIS — E785 Hyperlipidemia, unspecified: Secondary | ICD-10-CM | POA: Diagnosis not present

## 2024-01-05 DIAGNOSIS — I10 Essential (primary) hypertension: Secondary | ICD-10-CM | POA: Diagnosis not present

## 2024-01-05 DIAGNOSIS — F419 Anxiety disorder, unspecified: Secondary | ICD-10-CM | POA: Diagnosis not present

## 2024-01-07 DIAGNOSIS — M79642 Pain in left hand: Secondary | ICD-10-CM | POA: Diagnosis not present

## 2024-01-08 DIAGNOSIS — M25472 Effusion, left ankle: Secondary | ICD-10-CM | POA: Diagnosis not present

## 2024-01-08 DIAGNOSIS — M25572 Pain in left ankle and joints of left foot: Secondary | ICD-10-CM | POA: Diagnosis not present

## 2024-01-12 DIAGNOSIS — M5416 Radiculopathy, lumbar region: Secondary | ICD-10-CM | POA: Diagnosis not present

## 2024-01-12 DIAGNOSIS — M5116 Intervertebral disc disorders with radiculopathy, lumbar region: Secondary | ICD-10-CM | POA: Diagnosis not present

## 2024-01-13 DIAGNOSIS — M79672 Pain in left foot: Secondary | ICD-10-CM | POA: Diagnosis not present

## 2024-01-13 DIAGNOSIS — M79643 Pain in unspecified hand: Secondary | ICD-10-CM | POA: Diagnosis not present

## 2024-01-16 DIAGNOSIS — M25572 Pain in left ankle and joints of left foot: Secondary | ICD-10-CM | POA: Diagnosis not present

## 2024-01-16 DIAGNOSIS — M79642 Pain in left hand: Secondary | ICD-10-CM | POA: Diagnosis not present

## 2024-01-21 DIAGNOSIS — M791 Myalgia, unspecified site: Secondary | ICD-10-CM | POA: Diagnosis not present

## 2024-01-21 DIAGNOSIS — M5442 Lumbago with sciatica, left side: Secondary | ICD-10-CM | POA: Diagnosis not present

## 2024-01-21 DIAGNOSIS — M5441 Lumbago with sciatica, right side: Secondary | ICD-10-CM | POA: Diagnosis not present

## 2024-01-21 DIAGNOSIS — G894 Chronic pain syndrome: Secondary | ICD-10-CM | POA: Diagnosis not present

## 2024-01-21 DIAGNOSIS — G8929 Other chronic pain: Secondary | ICD-10-CM | POA: Diagnosis not present

## 2024-01-21 DIAGNOSIS — Z981 Arthrodesis status: Secondary | ICD-10-CM | POA: Diagnosis not present

## 2024-02-05 DIAGNOSIS — M48061 Spinal stenosis, lumbar region without neurogenic claudication: Secondary | ICD-10-CM | POA: Diagnosis not present

## 2024-02-05 DIAGNOSIS — M4727 Other spondylosis with radiculopathy, lumbosacral region: Secondary | ICD-10-CM | POA: Diagnosis not present

## 2024-02-05 DIAGNOSIS — M4807 Spinal stenosis, lumbosacral region: Secondary | ICD-10-CM | POA: Diagnosis not present

## 2024-02-05 DIAGNOSIS — M4726 Other spondylosis with radiculopathy, lumbar region: Secondary | ICD-10-CM | POA: Diagnosis not present

## 2024-02-05 DIAGNOSIS — Z981 Arthrodesis status: Secondary | ICD-10-CM | POA: Diagnosis not present

## 2024-02-20 DIAGNOSIS — M2550 Pain in unspecified joint: Secondary | ICD-10-CM | POA: Diagnosis not present

## 2024-02-26 DIAGNOSIS — Z1330 Encounter for screening examination for mental health and behavioral disorders, unspecified: Secondary | ICD-10-CM | POA: Diagnosis not present

## 2024-02-26 DIAGNOSIS — T84216A Breakdown (mechanical) of internal fixation device of vertebrae, initial encounter: Secondary | ICD-10-CM | POA: Diagnosis not present

## 2024-02-26 DIAGNOSIS — M5116 Intervertebral disc disorders with radiculopathy, lumbar region: Secondary | ICD-10-CM | POA: Diagnosis not present

## 2024-02-26 DIAGNOSIS — M48062 Spinal stenosis, lumbar region with neurogenic claudication: Secondary | ICD-10-CM | POA: Diagnosis not present

## 2024-03-11 DIAGNOSIS — G894 Chronic pain syndrome: Secondary | ICD-10-CM | POA: Diagnosis not present

## 2024-03-11 DIAGNOSIS — Z981 Arthrodesis status: Secondary | ICD-10-CM | POA: Diagnosis not present

## 2024-03-17 DIAGNOSIS — M130 Polyarthritis, unspecified: Secondary | ICD-10-CM | POA: Diagnosis not present

## 2024-05-20 NOTE — Progress Notes (Signed)
 Surgical indications per Dr. Iran last note:    Subjective:    Taylor Bowman is a 53 y.o. (DOB [birth year]) female.     Patient presents with  . Spine - Pain    PRE OP No new injuries new pain or concerns     HPI History of revision L3-5 PSF/TLIF/laminectomy on 01/07/22 with Dr. Gust. Previous L4-5 laminectomy/PSF 11/29/14 by Dr. Gust. She presents today for recheck after bilateral S1 TFESI 01/12/2024 with Dr. Darlean.  Unfortunately she had no improvement with the injection.  She is completely miserable today.  Her worst pain is in her low back bilaterally and radiates into the right greater than left thigh down into the dorsum of her feet.  Worsening numbness in her legs.  Legs feel weak right greater than left.  Previous bilateral S1 TFESI 11/26/22 with improvement until the fall 2 days after the injection on 11/28/22. Repeat bilateral S1 TFESI 03/24/23 with 75% relief for 6-8 weeks, 40% sustained for several months.  Pain is increased with any type of activity or sitting for extended times or standing for extended times.  She is completely miserable and would like to consider any treatment options.  She would consider surgery at this point.  Denies red flag signs or symptoms.    Reviewed and updated this visit by provider: Tobacco  Allergies  Meds  Problems  Med Hx  Surg Hx  Fam Hx       Review of Systems  Constitutional:  Negative for activity change, appetite change, chills, diaphoresis, fatigue, fever and unexpected weight change.  Respiratory:  Negative for shortness of breath.   Cardiovascular:  Negative for chest pain.  Genitourinary:  Negative for difficulty urinating.  Neurological:  Positive for weakness and numbness.  Psychiatric/Behavioral:  The patient is nervous/anxious.       Objective:   Vitals:   05/21/24 0919  BP: 128/88  Patient Position: Sitting  Pulse: 91  Weight: 166 lb (75.3 kg)     Physical exam: Patient is alert and oriented x3. Answers  questions appropriately. Does not appear ill or toxic. Cranial nerves II-XII are grossly intact. Visual field appear to be intact. No hearing deficits appreciated. Abdomen is soft and non-tender. Chest is without abnormality or deformity. Heart is regular rate and rhythm, no gallups, rubs, or murmurs. BUE 5/5 strength throughout. BLE pulses are palpable, skin is warm and dry, no erythema, or edema is noted.  Giveaway weakness in the bilateral quadriceps.  4/5 right ankle dorsiflexion.  Sensation is altered bilaterally in an L5 dist. Negative Belvie Bellman.  Gait is guarded secondary to back pain.  Severe pain secondary to flexion/extension.  Lower extremity reflexes are hypoactive and symmetric bilaterally.      Imaging: -X-rays lumbar spine done in the office  01/21/2024, were reviewed with the patient.  L3-L5 posterior spinal fusion, hardware intact and well in good position   advanced adjacent segment spondylosis at L2-3 and L5-S1.  No acute findings.  -X-rays left hand Nov 2024. No acute findings.  -CT scan April 2024 L -spine on [facility]. L3-5 PSF without complications. Moderate to severe adjacent segment degeneration L2-3 and L5-S1. No acute findings.   MRI lumbar 01/2024 on [facility]: 1.  Degenerative] postoperative changes without acute finding. 2. moderate canal stenosis and moderate bilateral foraminal stenosis at L2-3. 3.  Moderate bilateral foraminal stenosis at L5-S1. 4.  Posterior decompression and fixation L3-L5 without significant stenosis. Assessment / Plan:   Assessment 1. Lumbar stenosis with neurogenic  claudication (Primary) 2. Lumbar radiculopathy 3. Chronic pain syndrome       Plan Patient will participate in activities as tolerated using pain as their guide. Patient was educated on current diagnosis and typical treatment algorithm.  At this point we discussed things at length. She has significant stenosis above her fusion at L2-3 and below her fusion at L5-S1 with associated  stenosis and foraminal narrowing.  The injections have helped but unfortunately have not lasted. She is pretty uncomfortable and wants more of a permanent fix. From a surgical standpoint we discussed with her undergoing a Revision L2-S1 lami/PSF/TLIF.  The instrumentation and fusion from L3-L5 will need to be explored and revised as needed.  Dr. Gust spent over 45 minutes with patient face to face today discussing surgery. Dr. Gust showed th pt models and diagrams to describe surgery in detail. Discussed the prolonged recovery after this type of surgery and the typical hospital stay. Discussed that there are no guarantees with surgery. Pain may be no different or worse after the surgery. Discussed the risk of surgery including bleeding, infection, persistent pain, pseudoarthrosis, hardware failure, adjacent segment disease, proximal junctional kyphosis, malalignment, DVT, PE, nerve injury, dural tear, CSF leak, paralysis, need for additional surgery, medical complications, and complications from anesthesia.  After being educated on the surgical procedure and risks, patient would like to proceed with surgical scheduling for Revision L2-S1 lami/PSF/TLIF. Reviewed hospital course and post op expectations at length. Reviewed post operative activity and restrictions. Patient to f/u with Dr. Gust PA in office for H&P. Pt will not need any surgical clearances per Dr. Gust. She does not see a cardiologist and is not on any blood thinners.   Reviewed patient medications today. Continued prescribing of current medications is medically necessary to allow patient to maintain activity and quality of life. They take medications as prescribed. Medications improve their quality of life and level of function. Patient does not display aberrant behavior. Prescription monitoring report reviewed and appropriate. Upon discussion today, patient understands opioid therapy is optional and feels pain has been refractory to  reasonable conservative measures and is significant and affecting quality of life enough to warrant ongoing therapy and wishes to continue opioids. PDMP reviewed and appropriate. Discussed medications and prescribing with Dr. Gust who agrees with plan.  Continue oxycodone  10/325, 120, refilled recently.  Refilled fentanyl  25 mcg/h, #10.  Continue gabapentin  300 mg.  Continue tizanidine  2 mg, 270, 1 refill, 10/09/2023, 90-day supply.  Continue Ambien  10 mg, #90, 1 refill, 11/06/2023, 90-day supply.  She is presently being prescribed Xanax  by her primary care physician.  We have previously discussed the black box warning.  She understands the risks.  UDT April 2025 was reviewed and appropriate with current prescribing.  Questions were encouraged and answered today. Instructed to call with any new questions or concerns. Greater than 45 minutes was spent today reviewing patient chart, reviewing history, physical exam, education and counseling, medication management and documentation.   She can call for refill between appointments.  She will return to [facility] clinic for med checks when needed.  Risks, benefits, and alternatives of the medications and treatment plan prescribed today were discussed, and patient expressed understanding. Plan follow-up as discussed or as needed if any worsening symptoms or change in condition.    Addendum: Patient will continue to do her best participating in physician supervised home exercise program.  However at this point she is having such severe mechanical type pain with activity the exercises are aggravating her  pain.  We have discussed additional formal outpatient physical therapy and she is adamant that this would only make her worse at this point.  It is my medical opinion that outpatient physical therapy is contraindicated at this time and will worsen her symptomatology.   Hospital information:  PMH: HTN   PCP: hussain   Allergies: codeine, demerol, morphine ,     Nicotine: None   Pain meds (current): fentanyl  25 patch, percocet 10 4/day prescribed by us     Post-op Pain meds: Roxy 10 Q4, Roxy 15 Q4, dilaudid  IVP/PCA, toradol , Robaxin    Admit: inpatient   Discharge Plan: home with family   Pharmacy:  [facility]    Other: pt has LSO and will bring to surgery, no clearances per Dr. Gust, no blood thinners, History of revision L3-5 PSF/TLIF/laminectomy on 01/07/22 with Dr. Gust   Patient is scheduled for Revision L2-S1 lami/PSF/TLIF on 06/09/24 with Dr. Gust at [facility]. Greater than 30 minutes spent face to face with the patient discussing plan of care. Reviewed surgical procedure, hospital course and post op expectations at length. Reviewed post operative activity and restrictions. Patient will participate in a daily progressive ambulation program. Pt has confirmed their understanding with all of the above. All questions were answered fully.  I will order a lumbosacral orthosis which is medically necessary to optimize the fit and performance of the device  to reduce pain by restricting the mobility of the trunk, and/or to facilitate healing following injury or surgical procedure, and/or to support weak spinal muscles or deformity of the spine. The orthosis is specifically prescribed today to help reduce patient's preoperative symptoms and/or increase function while awaiting surgery. The patient was educated on how to wear the orthosis and will immediately be utilizing the device when dispensed

## 2024-05-21 DIAGNOSIS — M5416 Radiculopathy, lumbar region: Secondary | ICD-10-CM | POA: Diagnosis not present

## 2024-05-21 DIAGNOSIS — M48062 Spinal stenosis, lumbar region with neurogenic claudication: Secondary | ICD-10-CM | POA: Diagnosis not present

## 2024-05-21 DIAGNOSIS — G894 Chronic pain syndrome: Secondary | ICD-10-CM | POA: Diagnosis not present

## 2024-05-26 ENCOUNTER — Other Ambulatory Visit (HOSPITAL_COMMUNITY): Payer: Self-pay

## 2024-05-26 ENCOUNTER — Other Ambulatory Visit: Payer: Self-pay

## 2024-05-26 ENCOUNTER — Emergency Department (HOSPITAL_COMMUNITY)

## 2024-05-26 ENCOUNTER — Encounter (HOSPITAL_COMMUNITY): Payer: Self-pay | Admitting: Emergency Medicine

## 2024-05-26 ENCOUNTER — Telehealth (HOSPITAL_COMMUNITY): Payer: Self-pay

## 2024-05-26 ENCOUNTER — Inpatient Hospital Stay (HOSPITAL_COMMUNITY)
Admission: EM | Admit: 2024-05-26 | Discharge: 2024-05-28 | DRG: 638 | Disposition: A | Discharge status: Home / Self Care | Attending: Internal Medicine | Admitting: Internal Medicine

## 2024-05-26 DIAGNOSIS — R748 Abnormal levels of other serum enzymes: Secondary | ICD-10-CM | POA: Diagnosis present

## 2024-05-26 DIAGNOSIS — Z91030 Bee allergy status: Secondary | ICD-10-CM

## 2024-05-26 DIAGNOSIS — M549 Dorsalgia, unspecified: Secondary | ICD-10-CM | POA: Diagnosis present

## 2024-05-26 DIAGNOSIS — N179 Acute kidney failure, unspecified: Secondary | ICD-10-CM | POA: Diagnosis present

## 2024-05-26 DIAGNOSIS — G8929 Other chronic pain: Secondary | ICD-10-CM | POA: Diagnosis present

## 2024-05-26 DIAGNOSIS — E1122 Type 2 diabetes mellitus with diabetic chronic kidney disease: Secondary | ICD-10-CM | POA: Diagnosis present

## 2024-05-26 DIAGNOSIS — E119 Type 2 diabetes mellitus without complications: Principal | ICD-10-CM

## 2024-05-26 DIAGNOSIS — I129 Hypertensive chronic kidney disease with stage 1 through stage 4 chronic kidney disease, or unspecified chronic kidney disease: Secondary | ICD-10-CM | POA: Diagnosis present

## 2024-05-26 DIAGNOSIS — Z7984 Long term (current) use of oral hypoglycemic drugs: Secondary | ICD-10-CM

## 2024-05-26 DIAGNOSIS — N182 Chronic kidney disease, stage 2 (mild): Secondary | ICD-10-CM | POA: Diagnosis present

## 2024-05-26 DIAGNOSIS — R000 Tachycardia, unspecified: Secondary | ICD-10-CM | POA: Diagnosis not present

## 2024-05-26 DIAGNOSIS — Z794 Long term (current) use of insulin: Secondary | ICD-10-CM

## 2024-05-26 DIAGNOSIS — Z79899 Other long term (current) drug therapy: Secondary | ICD-10-CM

## 2024-05-26 DIAGNOSIS — M109 Gout, unspecified: Secondary | ICD-10-CM | POA: Diagnosis present

## 2024-05-26 DIAGNOSIS — E7800 Pure hypercholesterolemia, unspecified: Secondary | ICD-10-CM | POA: Diagnosis present

## 2024-05-26 DIAGNOSIS — E876 Hypokalemia: Secondary | ICD-10-CM | POA: Diagnosis present

## 2024-05-26 DIAGNOSIS — R531 Weakness: Secondary | ICD-10-CM | POA: Diagnosis not present

## 2024-05-26 DIAGNOSIS — E1100 Type 2 diabetes mellitus with hyperosmolarity without nonketotic hyperglycemic-hyperosmolar coma (NKHHC): Secondary | ICD-10-CM | POA: Diagnosis not present

## 2024-05-26 DIAGNOSIS — Z833 Family history of diabetes mellitus: Secondary | ICD-10-CM

## 2024-05-26 DIAGNOSIS — E1165 Type 2 diabetes mellitus with hyperglycemia: Secondary | ICD-10-CM | POA: Diagnosis present

## 2024-05-26 DIAGNOSIS — E875 Hyperkalemia: Secondary | ICD-10-CM | POA: Diagnosis present

## 2024-05-26 DIAGNOSIS — F419 Anxiety disorder, unspecified: Secondary | ICD-10-CM | POA: Diagnosis present

## 2024-05-26 DIAGNOSIS — Z885 Allergy status to narcotic agent status: Secondary | ICD-10-CM

## 2024-05-26 DIAGNOSIS — Z981 Arthrodesis status: Secondary | ICD-10-CM

## 2024-05-26 LAB — I-STAT CHEM 8, ED
BUN: 22 mg/dL — ABNORMAL HIGH (ref 6–20)
Calcium, Ion: 1.19 mmol/L (ref 1.15–1.40)
Chloride: 86 mmol/L — ABNORMAL LOW (ref 98–111)
Creatinine, Ser: 1.4 mg/dL — ABNORMAL HIGH (ref 0.44–1.00)
Glucose, Bld: >700 mg/dL (ref 70–99)
HCT: 50 % — ABNORMAL HIGH (ref 36.0–46.0)
Hemoglobin: 17 g/dL — ABNORMAL HIGH (ref 12.0–15.0)
Potassium: 5.2 mmol/L — ABNORMAL HIGH (ref 3.5–5.1)
Sodium: 122 mmol/L — ABNORMAL LOW (ref 135–145)
TCO2: 27 mmol/L (ref 22–32)

## 2024-05-26 LAB — I-STAT VENOUS BLOOD GAS, ED
Acid-Base Excess: 2 mmol/L (ref 0.0–2.0)
Bicarbonate: 28.3 mmol/L — ABNORMAL HIGH (ref 20.0–28.0)
Calcium, Ion: 1.17 mmol/L (ref 1.15–1.40)
HCT: 49 % — ABNORMAL HIGH (ref 36.0–46.0)
Hemoglobin: 16.7 g/dL — ABNORMAL HIGH (ref 12.0–15.0)
O2 Saturation: 57 %
Potassium: 5 mmol/L (ref 3.5–5.1)
Sodium: 121 mmol/L — ABNORMAL LOW (ref 135–145)
TCO2: 30 mmol/L (ref 22–32)
pCO2, Ven: 47.5 mmHg (ref 44–60)
pH, Ven: 7.384 (ref 7.25–7.43)
pO2, Ven: 31 mmHg — CL (ref 32–45)

## 2024-05-26 LAB — COMPREHENSIVE METABOLIC PANEL WITH GFR
ALT: 19 U/L (ref 0–44)
AST: 15 U/L (ref 15–41)
Albumin: 3.7 g/dL (ref 3.5–5.0)
Alkaline Phosphatase: 141 U/L — ABNORMAL HIGH (ref 38–126)
Anion gap: 17 — ABNORMAL HIGH (ref 5–15)
BUN: 19 mg/dL (ref 6–20)
CO2: 23 mmol/L (ref 22–32)
Calcium: 10.2 mg/dL (ref 8.9–10.3)
Chloride: 82 mmol/L — ABNORMAL LOW (ref 98–111)
Creatinine, Ser: 1.59 mg/dL — ABNORMAL HIGH (ref 0.44–1.00)
GFR, Estimated: 38 mL/min — ABNORMAL LOW (ref >60)
Glucose, Bld: 1173 mg/dL (ref 70–99)
Potassium: 4.9 mmol/L (ref 3.5–5.1)
Sodium: 122 mmol/L — ABNORMAL LOW (ref 135–145)
Total Bilirubin: 0.8 mg/dL (ref 0.0–1.2)
Total Protein: 7.2 g/dL (ref 6.5–8.1)

## 2024-05-26 LAB — CBC WITH DIFFERENTIAL/PLATELET
Abs Immature Granulocytes: 0.04 K/uL (ref 0.00–0.07)
Basophils Absolute: 0.1 K/uL (ref 0.0–0.1)
Basophils Relative: 1 %
Eosinophils Absolute: 0.1 K/uL (ref 0.0–0.5)
Eosinophils Relative: 0 %
HCT: 46.1 % — ABNORMAL HIGH (ref 36.0–46.0)
Hemoglobin: 15.8 g/dL — ABNORMAL HIGH (ref 12.0–15.0)
Immature Granulocytes: 0 %
Lymphocytes Relative: 9 %
Lymphs Abs: 1.1 K/uL (ref 0.7–4.0)
MCH: 27.9 pg (ref 26.0–34.0)
MCHC: 34.3 g/dL (ref 30.0–36.0)
MCV: 81.4 fL (ref 80.0–100.0)
Monocytes Absolute: 0.6 K/uL (ref 0.1–1.0)
Monocytes Relative: 5 %
Neutro Abs: 9.7 K/uL — ABNORMAL HIGH (ref 1.7–7.7)
Neutrophils Relative %: 85 %
Platelets: 289 K/uL (ref 150–400)
RBC: 5.66 MIL/uL — ABNORMAL HIGH (ref 3.87–5.11)
RDW: 12.3 % (ref 11.5–15.5)
WBC: 11.5 K/uL — ABNORMAL HIGH (ref 4.0–10.5)
nRBC: 0 % (ref 0.0–0.2)

## 2024-05-26 LAB — URINALYSIS, ROUTINE W REFLEX MICROSCOPIC
Bilirubin Urine: NEGATIVE
Glucose, UA: >=500 mg/dL — AB
Hgb urine dipstick: NEGATIVE
Ketones, ur: NEGATIVE mg/dL
Leukocytes,Ua: NEGATIVE
Nitrite: NEGATIVE
Protein, ur: >=300 mg/dL — AB
Specific Gravity, Urine: 1.025 (ref 1.005–1.030)
pH: 6 (ref 5.0–8.0)

## 2024-05-26 LAB — BASIC METABOLIC PANEL WITH GFR
Anion gap: 15 (ref 5–15)
Anion gap: 14 (ref 5–15)
BUN: 16 mg/dL (ref 6–20)
BUN: 17 mg/dL (ref 6–20)
CO2: 24 mmol/L (ref 22–32)
CO2: 26 mmol/L (ref 22–32)
Calcium: 10.4 mg/dL — ABNORMAL HIGH (ref 8.9–10.3)
Calcium: 9.8 mg/dL (ref 8.9–10.3)
Chloride: 94 mmol/L — ABNORMAL LOW (ref 98–111)
Chloride: 96 mmol/L — ABNORMAL LOW (ref 98–111)
Creatinine, Ser: 1.41 mg/dL — ABNORMAL HIGH (ref 0.44–1.00)
Creatinine, Ser: 1.68 mg/dL — ABNORMAL HIGH (ref 0.44–1.00)
GFR, Estimated: 44 mL/min — ABNORMAL LOW (ref >60)
GFR, Estimated: 36 mL/min — ABNORMAL LOW (ref >60)
Glucose, Bld: 478 mg/dL — ABNORMAL HIGH (ref 70–99)
Glucose, Bld: 363 mg/dL — ABNORMAL HIGH (ref 70–99)
Potassium: 4 mmol/L (ref 3.5–5.1)
Potassium: 3.5 mmol/L (ref 3.5–5.1)
Sodium: 133 mmol/L — ABNORMAL LOW (ref 135–145)
Sodium: 136 mmol/L (ref 135–145)

## 2024-05-26 LAB — GLUCOSE, CAPILLARY
Glucose-Capillary: 262 mg/dL — ABNORMAL HIGH (ref 70–99)
Glucose-Capillary: 220 mg/dL — ABNORMAL HIGH (ref 70–99)
Glucose-Capillary: 340 mg/dL — ABNORMAL HIGH (ref 70–99)

## 2024-05-26 LAB — CBG MONITORING, ED
Glucose-Capillary: >600 mg/dL (ref 70–99)
Glucose-Capillary: >600 mg/dL (ref 70–99)
Glucose-Capillary: >600 mg/dL (ref 70–99)
Glucose-Capillary: >600 mg/dL (ref 70–99)
Glucose-Capillary: >600 mg/dL (ref 70–99)
Glucose-Capillary: 493 mg/dL — ABNORMAL HIGH (ref 70–99)
Glucose-Capillary: 372 mg/dL — ABNORMAL HIGH (ref 70–99)
Glucose-Capillary: 483 mg/dL — ABNORMAL HIGH (ref 70–99)
Glucose-Capillary: 369 mg/dL — ABNORMAL HIGH (ref 70–99)

## 2024-05-26 LAB — BETA-HYDROXYBUTYRIC ACID: Beta-Hydroxybutyric Acid: 0.54 mmol/L — ABNORMAL HIGH (ref 0.05–0.27)

## 2024-05-26 LAB — OSMOLALITY: Osmolality: 327 mosm/kg (ref 275–295)

## 2024-05-26 LAB — HIV ANTIBODY (ROUTINE TESTING W REFLEX): HIV Screen 4th Generation wRfx: NONREACTIVE

## 2024-05-26 MED ORDER — ONDANSETRON 4 MG PO TBDP
8.0000 mg | ORAL_TABLET | Freq: Once | ORAL | Status: AC
  Administered 2024-05-26: 8 mg via ORAL
  Filled 2024-05-26: qty 2

## 2024-05-26 MED ORDER — ACETAMINOPHEN 650 MG RE SUPP: 650.0000 mg | Freq: Four times a day (QID) | RECTAL | Status: DC | PRN

## 2024-05-26 MED ORDER — ACETAMINOPHEN 325 MG PO TABS
650.0000 mg | ORAL_TABLET | Freq: Four times a day (QID) | ORAL | Status: DC | PRN
  Administered 2024-05-27: 650 mg via ORAL
  Filled 2024-05-26: qty 2

## 2024-05-26 MED ORDER — POTASSIUM CHLORIDE 10 MEQ/100ML IV SOLN
10.0000 meq | INTRAVENOUS | Status: AC
  Administered 2024-05-26 (×2): 10 meq via INTRAVENOUS
  Filled 2024-05-26 (×2): qty 100

## 2024-05-26 MED ORDER — INSULIN ASPART 100 UNIT/ML IJ SOLN
0.0000 [IU] | Freq: Every day | INTRAMUSCULAR | Status: DC
  Administered 2024-05-26: 4 [IU] via SUBCUTANEOUS
  Administered 2024-05-27: 3 [IU] via SUBCUTANEOUS
  Filled 2024-05-26 (×2): qty 3

## 2024-05-26 MED ORDER — LACTATED RINGERS IV BOLUS
20.0000 mL/kg | Freq: Once | INTRAVENOUS | Status: AC
  Administered 2024-05-26: 1520 mL via INTRAVENOUS

## 2024-05-26 MED ORDER — DEXTROSE IN LACTATED RINGERS 5 % IV SOLN: INTRAVENOUS | Status: DC

## 2024-05-26 MED ORDER — INSULIN ASPART 100 UNIT/ML IJ SOLN
0.0000 [IU] | Freq: Three times a day (TID) | INTRAMUSCULAR | Status: DC
  Administered 2024-05-27: 25 [IU] via SUBCUTANEOUS
  Filled 2024-05-26: qty 15

## 2024-05-26 MED ORDER — DEXTROSE 50 % IV SOLN: 0.0000 mL | INTRAVENOUS | Status: DC | PRN

## 2024-05-26 MED ORDER — ZOLPIDEM TARTRATE 5 MG PO TABS
5.0000 mg | ORAL_TABLET | Freq: Every evening | ORAL | Status: DC | PRN
Start: 2024-05-26 — End: 2024-05-28
  Administered 2024-05-26 – 2024-05-27 (×2): 5 mg via ORAL
  Filled 2024-05-26 (×2): qty 1

## 2024-05-26 MED ORDER — INSULIN REGULAR(HUMAN) IN NACL 100-0.9 UT/100ML-% IV SOLN
INTRAVENOUS | Status: DC
  Administered 2024-05-26: 3.6 [IU]/h via INTRAVENOUS
  Administered 2024-05-26: 5 [IU]/h via INTRAVENOUS
  Administered 2024-05-26: 3.6 [IU]/h via INTRAVENOUS
  Administered 2024-05-26: 8 [IU]/h via INTRAVENOUS
  Filled 2024-05-26 (×2): qty 100

## 2024-05-26 MED ORDER — LACTATED RINGERS IV SOLN: INTRAVENOUS | Status: DC

## 2024-05-26 MED ORDER — ENOXAPARIN SODIUM 40 MG/0.4ML IJ SOSY
40.0000 mg | PREFILLED_SYRINGE | INTRAMUSCULAR | Status: DC
  Administered 2024-05-26 – 2024-05-27 (×2): 40 mg via SUBCUTANEOUS
  Filled 2024-05-26 (×2): qty 0.4

## 2024-05-26 MED ORDER — LIVING WELL WITH DIABETES BOOK
Freq: Once | Status: AC
  Filled 2024-05-26: qty 1

## 2024-05-26 MED ORDER — INSULIN GLARGINE-YFGN 100 UNIT/ML ~~LOC~~ SOLN
20.0000 [IU] | SUBCUTANEOUS | Status: DC
  Administered 2024-05-26: 20 [IU] via SUBCUTANEOUS
  Filled 2024-05-26 (×2): qty 0.2

## 2024-05-26 MED ORDER — SODIUM CHLORIDE 0.9 % IV SOLN: INTRAVENOUS | Status: DC

## 2024-05-26 NOTE — ED Triage Notes (Signed)
 Pt reports a few days of nausea with frequent urination that worsened today. Denies abd pain. Also having dry mouth and constant thirst. 1 emesis occurrence this morning.

## 2024-05-26 NOTE — Telephone Encounter (Addendum)
 Pharmacy Patient Advocate Encounter  Insurance verification completed.    The patient is insured through [employer]. Patient has Medicare and is not eligible for a copay card, but may be able to apply for patient assistance or Medicare RX Payment Plan (Patient Must reach out to their plan, if eligible for payment plan), if available.    Ran test claim for Lantus  100unit Pen and the current 30 day co-pay is $4.80.  Ran test claim for Novolog  100unit Pen and the current 30 day co-pay is $4.80.  Ran test claim for Dexcom G7 Sensor and it requires a PA  Ran test claim for [employer] and it requires a [employer] prefers Accu chek or true metrix  This test claim was processed through [employer]- copay amounts may vary at other pharmacies due to [employer], or as the patient moves through the different stages of their insurance plan.

## 2024-05-26 NOTE — ED Notes (Signed)
 Dr Claudene notified of patient's critical lab results of BS of [phone]

## 2024-05-26 NOTE — ED Notes (Signed)
 Dr Claudene contacted about patient's Osmolality results of 327

## 2024-05-26 NOTE — ED Notes (Signed)
 Lab results was reported to Nurse Wellington.

## 2024-05-26 NOTE — Care Plan (Signed)
 Patient arrived to the unit AAOX 4 Complaining of pain 3/10 in the lower back No other signs/symptoms of distress Skin assessment done with Mitzie, RN  Skin Intact / old surgery scar mid low back  [place], [place]
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
## 2024-05-26 NOTE — Plan of Care (Signed)

## 2024-05-26 NOTE — ED Notes (Signed)
 Lab results was given to Dr. Levander.

## 2024-05-26 NOTE — Inpatient Diabetes Management (Addendum)
 Inpatient Diabetes Program Recommendations  AACE/ADA: New Consensus Statement on Inpatient Glycemic Control (2015)  Target Ranges:  Prepandial:   less than 140 mg/dL      Peak postprandial:   less than 180 mg/dL (1-2 hours)      Critically ill patients:  140 - 180 mg/dL   Lab Results  Component Value Date   GLUCAP 493 (H) 05/26/2024   HGBA1C 6.1 (H) 07/14/2021    Review of Glycemic Control  Diabetes history: new onset DM2 Outpatient Diabetes medications: none Current orders for Inpatient glycemic control: IV insulin  drip  Inpatient Diabetes Program Recommendations:   Spoke with patient at the bedside. Patient states that she was having frequent urination and thirst for about 1 week. States that many in her family have diabetes. Patient last visited her PCP in September; is scheduled to have back surgery on December 10.   Patient states that she has been an [employer] for 30 years. States that she has always fasted in the morning and would not eat until noon. She liked to walk up to 5 miles at a time, but has not been able to because of back pain. She has been on some steroids off and on, but not consistently.   Discussed the possibility of having to be discharged on insulin . She states that she is familiar with insulin  pen, but never used it for herself. Discussed the continuous glucose monitor, blood glucose meter, strips, and lancets. Will have pharmacy check on health insurance benefits for insulins and CGMs. Will order Living Well with Diabetes booklet.   Note that pharmacy did benefit check:  Lantus  copay is $4.80, Novolog  is $4.80 copay both CGM's require PA- her insurance prefers Accu chek or true metrix   Will continue to monitor blood sugars while in the hospital.  Marjorie Lunger RN BSN CDE Diabetes Coordinator Pager: [phone]  8am-5pm

## 2024-05-26 NOTE — ED Provider Notes (Signed)
 [facility] AT [facility] Provider Note   CSN: [id number] Arrival date & time: 05/26/24  9167     Patient presents with: Nausea and Urinary Frequency   Taylor Bowman is a 53 y.o. female.   HPI 53 year old female presents today complaining of urinary frequency and pain that began several nights ago.  She reports frequent urination with associated nausea.  She has vomited several times.  She has chronic back pain and does not think this is different from her baseline.  She is scheduled for back surgery December 10.  She has not had any new trauma.  She denies fever but has had some chills.    Prior to Admission medications   Medication Sig Start Date End Date Taking? Authorizing Provider  ALPRAZolam  (XANAX ) 0.5 MG tablet Take 1 tablet (0.5 mg total) by mouth daily as needed for anxiety. 07/16/21   Akula, Vijaya, MD  atorvastatin  (LIPITOR) 40 MG tablet Take 1 tablet (40 mg total) by mouth daily at 6 PM. Patient taking differently: Take 40 mg by mouth daily with supper. 10/20/14   Margarie Cutter, MD  colchicine  0.6 MG tablet Take 2 tablets now.  Take 1 tablet 1 hour after the first 2 tablets.  Starting tomorrow, take 1 tablet every 12 hours until your symptoms have resolved. 05/25/23   Joesph Shaver Scales, PA-C  diphenhydrAMINE  (BENADRYL ) 25 MG tablet Take 25 mg by mouth daily as needed for itching (seasonal allergies).    [provider]  EPINEPHrine (EPIPEN 2-PAK) 0.3 mg/0.3 mL IJ SOAJ injection Inject 0.3 mg into the muscle as needed for anaphylaxis.    [provider]  fentaNYL  (DURAGESIC  - DOSED MCG/HR) 12 MCG/HR Place 12 mcg onto the skin every 3 (three) days.     [provider]  gabapentin  (NEURONTIN ) 300 MG capsule Take 300-600 mg by mouth See admin instructions. Take one capsule (300 mg) by mouth every morning and mid-afternoon, take two capsules (600 mg) at bedtime    [provider]  ibuprofen  (ADVIL ) 200 MG  tablet Take 400 mg by mouth daily as needed (pain).    [provider]  ketorolac  (TORADOL ) 10 MG tablet Take 1 tablet (10 mg total) by mouth every 6 (six) hours as needed (pain). 05/23/23   Vonna Sharlet POUR, MD  lisinopril -hydrochlorothiazide  (PRINZIDE ,ZESTORETIC ) 10-12.5 MG per tablet Take 1 tablet by mouth daily.    [provider]  Multiple Vitamin (MULTIVITAMIN) tablet Take 1 tablet by mouth every morning.    [provider]  neomycin -polymyxin b-dexamethasone  (MAXITROL ) 3.5-10000-0.1 OINT Place 1 application into the right eye See admin instructions. Apply sparingly to right eye three times daily until healed    [provider]  oxyCODONE -acetaminophen  (PERCOCET) 10-325 MG tablet Take 1 tablet by mouth every 6 (six) hours. 02/07/18   [provider]  tiZANidine  (ZANAFLEX ) 2 MG tablet Take 2 mg by mouth 3 (three) times daily as needed for muscle spasms.    [provider]  zolpidem  (AMBIEN ) 10 MG tablet Take 10 mg by mouth at bedtime.    [provider]    Allergies: Bee venom, Codeine, Demerol, and Morphine  and codeine    Review of Systems  Updated Vital Signs BP (!) 155/106   Pulse (!) 104   Temp 97.9 F (36.6 C)   Resp 18   Ht 1.651 m (5' 5)   Wt 76 kg   SpO2 100%   BMI 27.88 kg/m   Physical Exam Vitals reviewed.  Constitutional:      Appearance: Normal appearance.  HENT:     Head: Normocephalic and atraumatic.     Right Ear: External ear normal.     Left Ear: External ear normal.     Nose: Nose normal.     Mouth/Throat:     Pharynx: Oropharynx is clear.  Eyes:     Pupils: Pupils are equal, round, and reactive to light.  Cardiovascular:     Rate and Rhythm: Regular rhythm. Tachycardia present.     Pulses: Normal pulses.  Pulmonary:     Effort: Pulmonary effort is normal.     Breath sounds: Normal breath sounds.  Abdominal:     General: Abdomen is flat. Bowel sounds are normal.     Palpations:  Abdomen is soft.     Tenderness: There is no abdominal tenderness.     Comments: Mild right CVA tenderness although this may represent her back pain as she states this is similar to her prior back pain  Musculoskeletal:        General: Normal range of motion.     Cervical back: Normal range of motion.  Skin:    General: Skin is warm and dry.     Capillary Refill: Capillary refill takes less than 2 seconds.  Neurological:     General: No focal deficit present.     Mental Status: She is alert.  Psychiatric:        Mood and Affect: Mood normal.     (all labs ordered are listed, but only abnormal results are displayed) Labs Reviewed  URINALYSIS, ROUTINE W REFLEX MICROSCOPIC - Abnormal; Notable for the following components:      Result Value   Color, Urine STRAW (*)    Glucose, UA >=500 (*)    Protein, ur >=300 (*)    Bacteria, UA RARE (*)    All other components within normal limits  I-STAT CHEM 8, ED - Abnormal; Notable for the following components:   Sodium 122 (*)    Potassium 5.2 (*)    Chloride 86 (*)    BUN 22 (*)    Creatinine, Ser 1.40 (*)    Glucose, Bld >700 (*)    Hemoglobin 17.0 (*)    HCT 50.0 (*)    All other components within normal limits  I-STAT VENOUS BLOOD GAS, ED - Abnormal; Notable for the following components:   pO2, Ven 31 (*)    Bicarbonate 28.3 (*)    Sodium 121 (*)    HCT 49.0 (*)    Hemoglobin 16.7 (*)    All other components within normal limits  CBC WITH DIFFERENTIAL/PLATELET  BETA-HYDROXYBUTYRIC ACID  COMPREHENSIVE METABOLIC PANEL WITH GFR  CBG MONITORING, ED    EKG: EKG Interpretation Date/Time:  Wednesday May 26 2024 09:39:38 EST Ventricular Rate:  84 PR Interval:  141 QRS Duration:  90 QT Interval:  395 QTC Calculation: 467 R Axis:   -46  Text Interpretation: Sinus rhythm Left anterior fascicular block Left ventricular hypertrophy Anterior Q waves, possibly due to LVH Borderline T abnormalities, inferior leads Confirmed by  Levander Houston [phone]) on 05/26/2024 9:46:41 AM  Radiology: No results found.   .Critical Care  Performed by: Levander Houston, MD Authorized by: Levander Houston, MD   Critical care provider statement:    Critical care time (minutes):  30   Critical care end time:  05/26/2024 10:40 AM   Critical care time was exclusive of:  Separately billable procedures and treating other patients and  teaching time   Critical care was necessary to treat or prevent imminent or life-threatening deterioration of the following conditions:  Endocrine crisis   Critical care was time spent personally by me on the following activities:  Development of treatment plan with patient or surrogate, discussions with consultants, evaluation of patient's response to treatment, examination of patient, ordering and review of laboratory studies, ordering and review of radiographic studies, ordering and performing treatments and interventions, pulse oximetry, re-evaluation of patient's condition and review of old charts    Medications Ordered in the ED  insulin  regular, human (MYXREDLIN ) 100 units/ 100 mL infusion (has no administration in time range)  lactated ringers  infusion (has no administration in time range)  dextrose  5 % in lactated ringers  infusion (has no administration in time range)  dextrose  50 % solution 0-50 mL (has no administration in time range)  ondansetron  (ZOFRAN -ODT) disintegrating tablet 8 mg (8 mg Oral Given 05/26/24 0907)    Clinical Course as of 05/26/24 1040  Wed May 26, 2024  0930 I-STAT reviewed noted to have elevated glucose greater than 700, sodium 122, potassium elevated 5.2, chloride 86, creatinine 1.4 hemoglobin elevated Urinalysis reviewed and less than 5 red blood cells, white blood cells, rare bacteria, squamous epithelial cells present [DR]    Clinical Course User Index [DR] Levander Houston, MD                                 Medical Decision Making Amount and/or Complexity of Data  Reviewed Labs: ordered. Radiology: ordered.  Risk Prescription drug management. Decision regarding hospitalization.   53 year old female history of hypertension, prediabetes, presents today complaining of urinary frequency.  She endorses frequency with nausea.  Patient evaluated here with labs and urinalysis Initial i-STAT shows glucose greater than 700, sodium 122, elevated creatinine at 1.4 CO2 is normal Doubt DKA, will evaluate for hyperosmolar hyperglycemia Suspect urinary frequency due to hyperglycemia. Patient is being treated for hyperglycemic crisis with IV fluids and insulin  is ordered Care discussed with Dr. Claudene who will admit for hospitalist service    Final diagnoses:  Diabetes mellitus, new onset [facility])    ED Discharge Orders     None          Levander Houston, MD 05/26/24 1040

## 2024-05-26 NOTE — Plan of Care (Signed)
 Last CBG in the 200s.  Transition off of insulin  drip orders placed.  Start Semglee  20 units daily with moderate SSI.  Adjust regimen as deemed medically appropriate.  Follow-up hemoglobin A1c.

## 2024-05-26 NOTE — ED Notes (Signed)
 RN's attempt to access IV was unsuccessful. Patient reports that IV access usually has to be obtain by US . An order for IV team placed.

## 2024-05-27 DIAGNOSIS — E1100 Type 2 diabetes mellitus with hyperosmolarity without nonketotic hyperglycemic-hyperosmolar coma (NKHHC): Secondary | ICD-10-CM

## 2024-05-27 DIAGNOSIS — R748 Abnormal levels of other serum enzymes: Secondary | ICD-10-CM

## 2024-05-27 DIAGNOSIS — N179 Acute kidney failure, unspecified: Secondary | ICD-10-CM | POA: Diagnosis not present

## 2024-05-27 LAB — CBC
HCT: 40 % (ref 36.0–46.0)
Hemoglobin: 13.9 g/dL (ref 12.0–15.0)
MCH: 27.9 pg (ref 26.0–34.0)
MCHC: 34.8 g/dL (ref 30.0–36.0)
MCV: 80.2 fL (ref 80.0–100.0)
Platelets: 240 K/uL (ref 150–400)
RBC: 4.99 MIL/uL (ref 3.87–5.11)
RDW: 12.6 % (ref 11.5–15.5)
WBC: 11.5 K/uL — ABNORMAL HIGH (ref 4.0–10.5)
nRBC: 0 % (ref 0.0–0.2)

## 2024-05-27 LAB — BASIC METABOLIC PANEL WITH GFR
Anion gap: 12 (ref 5–15)
Anion gap: 14 (ref 5–15)
BUN: 18 mg/dL (ref 6–20)
BUN: 17 mg/dL (ref 6–20)
CO2: 28 mmol/L (ref 22–32)
CO2: 23 mmol/L (ref 22–32)
Calcium: 9.5 mg/dL (ref 8.9–10.3)
Calcium: 8.9 mg/dL (ref 8.9–10.3)
Chloride: 96 mmol/L — ABNORMAL LOW (ref 98–111)
Chloride: 99 mmol/L (ref 98–111)
Creatinine, Ser: 1.48 mg/dL — ABNORMAL HIGH (ref 0.44–1.00)
Creatinine, Ser: 1.37 mg/dL — ABNORMAL HIGH (ref 0.44–1.00)
GFR, Estimated: 42 mL/min — ABNORMAL LOW (ref >60)
GFR, Estimated: 46 mL/min — ABNORMAL LOW (ref >60)
Glucose, Bld: 336 mg/dL — ABNORMAL HIGH (ref 70–99)
Glucose, Bld: 320 mg/dL — ABNORMAL HIGH (ref 70–99)
Potassium: 3.3 mmol/L — ABNORMAL LOW (ref 3.5–5.1)
Potassium: 3.5 mmol/L (ref 3.5–5.1)
Sodium: 136 mmol/L (ref 135–145)
Sodium: 136 mmol/L (ref 135–145)

## 2024-05-27 LAB — GLUCOSE, CAPILLARY
Glucose-Capillary: 132 mg/dL — ABNORMAL HIGH (ref 70–99)
Glucose-Capillary: 211 mg/dL — ABNORMAL HIGH (ref 70–99)
Glucose-Capillary: 258 mg/dL — ABNORMAL HIGH (ref 70–99)
Glucose-Capillary: 275 mg/dL — ABNORMAL HIGH (ref 70–99)
Glucose-Capillary: 295 mg/dL — ABNORMAL HIGH (ref 70–99)
Glucose-Capillary: 324 mg/dL — ABNORMAL HIGH (ref 70–99)
Glucose-Capillary: 366 mg/dL — ABNORMAL HIGH (ref 70–99)
Glucose-Capillary: 413 mg/dL — ABNORMAL HIGH (ref 70–99)

## 2024-05-27 MED ORDER — BLOOD GLUCOSE TEST VI STRP: 1.0000 | ORAL_STRIP | Freq: Three times a day (TID) | 0 refills | Status: DC

## 2024-05-27 MED ORDER — INSULIN PEN NEEDLE 32G X 4 MM MISC: 1.0000 | 1 refills | Status: DC | PRN

## 2024-05-27 MED ORDER — INSULIN ASPART 100 UNIT/ML IJ SOLN
0.0000 [IU] | Freq: Three times a day (TID) | INTRAMUSCULAR | Status: DC
  Administered 2024-05-27: 3 [IU] via SUBCUTANEOUS
  Administered 2024-05-27: 11 [IU] via SUBCUTANEOUS
  Administered 2024-05-28: 7 [IU] via SUBCUTANEOUS
  Filled 2024-05-27: qty 11
  Filled 2024-05-27: qty 3
  Filled 2024-05-27: qty 7

## 2024-05-27 MED ORDER — SODIUM CHLORIDE 0.9 % IV SOLN: 12.5000 mg | Freq: Three times a day (TID) | INTRAVENOUS | Status: DC | PRN

## 2024-05-27 MED ORDER — BLOOD GLUCOSE MONITORING SUPPL DEVI: 1.0000 | Freq: Three times a day (TID) | 0 refills | Status: DC

## 2024-05-27 MED ORDER — INSULIN GLARGINE-YFGN 100 UNIT/ML ~~LOC~~ SOLN
26.0000 [IU] | SUBCUTANEOUS | Status: DC
  Administered 2024-05-27: 26 [IU] via SUBCUTANEOUS
  Filled 2024-05-27 (×2): qty 0.26

## 2024-05-27 MED ORDER — LANCET DEVICE MISC: 1.0000 | Freq: Three times a day (TID) | 0 refills | Status: DC

## 2024-05-27 MED ORDER — PANTOPRAZOLE SODIUM 40 MG PO TBEC
40.0000 mg | DELAYED_RELEASE_TABLET | Freq: Every day | ORAL | Status: DC
  Administered 2024-05-27: 40 mg via ORAL
  Filled 2024-05-27: qty 1

## 2024-05-27 MED ORDER — ONDANSETRON HCL 4 MG/2ML IJ SOLN
4.0000 mg | Freq: Four times a day (QID) | INTRAMUSCULAR | Status: DC | PRN
  Administered 2024-05-27 – 2024-05-28 (×2): 4 mg via INTRAVENOUS
  Filled 2024-05-27 (×2): qty 2

## 2024-05-27 MED ORDER — NOVOLOG FLEXPEN 100 UNIT/ML ~~LOC~~ SOPN: PEN_INJECTOR | SUBCUTANEOUS | 0 refills | Status: DC

## 2024-05-27 MED ORDER — INSULIN ASPART 100 UNIT/ML IJ SOLN
4.0000 [IU] | Freq: Three times a day (TID) | INTRAMUSCULAR | Status: DC
  Administered 2024-05-27 – 2024-05-28 (×3): 4 [IU] via SUBCUTANEOUS
  Filled 2024-05-27 (×3): qty 4

## 2024-05-27 MED ORDER — INSULIN GLARGINE 100 UNIT/ML SOLOSTAR PEN: PEN_INJECTOR | SUBCUTANEOUS | 1 refills | Status: DC

## 2024-05-27 MED ORDER — LANCETS MISC: 1.0000 | 0 refills | Status: DC

## 2024-05-27 MED ORDER — METFORMIN HCL 500 MG PO TABS: 500.0000 mg | ORAL_TABLET | Freq: Two times a day (BID) | ORAL | 1 refills | Status: DC

## 2024-05-27 NOTE — Plan of Care (Signed)
  Problem: Education: Goal: Ability to describe self-care measures that may prevent or decrease complications (Diabetes Survival Skills Education) will improve Outcome: Progressing   Problem: Coping: Goal: Ability to adjust to condition or change in health will improve Outcome: Progressing   Problem: Fluid Volume: Goal: Ability to maintain a balanced intake and output will improve Outcome: Progressing   Problem: Health Behavior/Discharge Planning: Goal: Ability to identify and utilize available resources and services will improve Outcome: Progressing   Problem: Metabolic: Goal: Ability to maintain appropriate glucose levels will improve Outcome: Progressing   Problem: Education: Goal: Ability to describe self-care measures that may prevent or decrease complications (Diabetes Survival Skills Education) will improve Outcome: Progressing   Problem: Cardiac: Goal: Ability to maintain an adequate cardiac output will improve Outcome: Progressing   Problem: Health Behavior/Discharge Planning: Goal: Ability to identify and utilize available resources and services will improve Outcome: Progressing Goal: Ability to manage health-related needs will improve Outcome: Progressing   Problem: Nutritional: Goal: Maintenance of adequate nutrition will improve Outcome: Progressing   Problem: Education: Goal: Knowledge of General Education information will improve Description: Including pain rating scale, medication(s)/side effects and non-pharmacologic comfort measures Outcome: Progressing   Problem: Clinical Measurements: Goal: Ability to maintain clinical measurements within normal limits will improve Outcome: Progressing   Problem: Elimination: Goal: Will not experience complications related to bowel motility Outcome: Progressing Goal: Will not experience complications related to urinary retention Outcome: Progressing

## 2024-05-27 NOTE — Plan of Care (Signed)

## 2024-05-27 NOTE — Care Management CC44 (Signed)
 Condition Code 44 Documentation Completed  Patient Details  Name: Taylor Bowman MRN: [id number] Date of Birth: January 19, 1971   Condition Code 44 given:  Yes Patient signature on Condition Code 44 notice:  Yes Documentation of 2 MD's agreement:  Yes Code 44 added to claim:  Yes    Tom-Johnson, Harvest Muskrat, RN 05/27/2024, 10:11 AM

## 2024-05-27 NOTE — Progress Notes (Signed)
 Brief note Plans for discharge this morning unfortunately-CBGs are> 400, she is also very nauseous. Give 25 units of SQ NovoLog  x 1 Change SSI to resistant scale Add 4 units of scheduled NovoLog  with meals Zofran  as needed for nausea Discussed with patient-she feels uncomfortable going home-Will keep her 1 additional day-and try to optimize glycemic regimen and control her nausea before we we contemplate discharge.

## 2024-05-27 NOTE — Discharge Summary (Addendum)
 PATIENT DETAILS Name: Taylor Bowman Age: 53 y.o. Sex: female Date of Birth: 01-01-1971 MRN: [id number]. Admitting Physician: Maximino DELENA Sharps, MD ERE:Yldjpw, Ardell, MD  Admit Date: 05/26/2024 Discharge date: 05/27/2024  Recommendations for Outpatient Follow-up:  Follow up with PCP in 1-2 weeks Please obtain CMP/CBC in one week A1c is pending-please follow Please optimize insulin  regimen  Admitted From:  Home  Disposition: Home   Discharge Condition: good  CODE STATUS:   Code Status: Full Code   Diet recommendation:  Diet Order             Diet - low sodium heart healthy           Diet Carb Modified           Diet Carb Modified Fluid consistency: Thin  Diet effective now                    Brief Summary: 53 year old female with history of HTN, HLD, chronic back pain (scheduled for back surgery December)-presented with nausea, weakness, urinary frequency, polydipsia-found to have hyperosmolar hyperglycemic nonketotic state secondary to newly diagnosed DM-2.  Started on insulin  infusion/IV fluids and admitted to the hospitalist service.  Brief Hospital Course: Hyperglycemic hyperosmolar nonketotic state Managed with IVF/IV insulin  CBGs have improved-patient clinically better and has been transition to subcutaneous insulin   Newly diagnosed DM-2 A1c pending CBGs on the higher side but relatively stable Increase Lantus  to 26 units on discharge-continue SSI-add metformin  Patient will follow with PCP for further optimization Extensive diabetic education has been done by diabetic coordinator This MD went over basic diabetic education again this morning-and also made patient aware of symptoms of hypoglycemia and needed intervention (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  AKI on CKD stage II AKI was likely secondary to osmotic diuresis in the setting of uncontrolled  hyperglycemia-creatinine has improved-patient appears to have mild CKD-probably at baseline.  Chronic back pain Resume usual prior outpatient regimen Follow-up with spine surgery/neurosurgery as previous-per patient-she is scheduled for back surgery sometime in December (next month)  Discharge Diagnoses:  Principal Problem:   Hyperosmolar hyperglycemic state (HHS) (HCC) Active Problems:   AKI (acute kidney injury)   Elevated alkaline phosphatase level   Discharge Instructions:  Activity:  As tolerated   Discharge Instructions     Call MD for:  persistant dizziness or light-headedness   Complete by: As directed    Call MD for:  persistant nausea and vomiting   Complete by: As directed    Diet - low sodium heart healthy   Complete by: As directed    Diet Carb Modified   Complete by: As directed    Discharge instructions   Complete by: As directed    Follow with Primary MD  [name], MD in 1-2 weeks  Please check your blood glucose/CBGs multiple times a day-including at least 1 fasting reading, keep a record of these readings and take it to your next appointment with your primary care practitioner.  Please get a complete blood count and chemistry panel checked by your Primary MD at your next visit, and again as instructed by your Primary MD.  Get Medicines reviewed and adjusted: Please take all your medications with you for your next visit with your Primary MD  Laboratory/radiological data: Please request your Primary MD to go over all hospital tests and procedure/radiological results at the follow up, please ask your Primary MD  to get all Hospital records sent to his/her office.  In some cases, they will be blood work, cultures and biopsy results pending at the time of your discharge. Please request that your primary care M.D. follows up on these results.  Also Note the following: If you experience worsening of your admission symptoms, develop shortness of breath,  life threatening emergency, suicidal or homicidal thoughts you must seek medical attention immediately by calling 911 or calling your MD immediately  if symptoms less severe.  You must read complete instructions/literature along with all the possible adverse reactions/side effects for all the Medicines you take and that have been prescribed to you. Take any new Medicines after you have completely understood and accpet all the possible adverse reactions/side effects.   Do not drive when taking Pain medications or sleeping medications (Benzodaizepines)  Do not take more than prescribed Pain, Sleep and Anxiety Medications. It is not advisable to combine anxiety,sleep and pain medications without talking with your primary care practitioner  Special Instructions: If you have smoked or chewed Tobacco  in the last 2 yrs please stop smoking, stop any regular Alcohol  and or any Recreational drug use.  Wear Seat belts while driving.  Please note: You were cared for by a hospitalist during your hospital stay. Once you are discharged, your primary care physician will handle any further medical issues. Please note that NO REFILLS for any discharge medications will be authorized once you are discharged, as it is imperative that you return to your primary care physician (or establish a relationship with a primary care physician if you do not have one) for your post hospital discharge needs so that they can reassess your need for medications and monitor your lab values.   Increase activity slowly   Complete by: As directed       Allergies as of 05/27/2024       Reactions   Bee Venom Anaphylaxis, Hives   Codeine Hives   Demerol Hives   Morphine  And Codeine Hives, Itching        Medication List     TAKE these medications    ALPRAZolam  0.5 MG tablet Commonly known as: XANAX  Take 1 tablet (0.5 mg total) by mouth daily as needed for anxiety.   atorvastatin  40 MG tablet Commonly known as:  LIPITOR Take 1 tablet (40 mg total) by mouth daily at 6 PM. What changed: when to take this   Blood Glucose Monitoring Suppl Devi 1 each by Does not apply route in the morning, at noon, and at bedtime. May substitute to any manufacturer covered by patient's insurance.   BLOOD GLUCOSE TEST STRIPS Strp 1 each by In Vitro route in the morning, at noon, and at bedtime. May substitute to any manufacturer covered by patient's insurance.   colchicine  0.6 MG tablet Take 2 tablets now.  Take 1 tablet 1 hour after the first 2 tablets.  Starting tomorrow, take 1 tablet every 12 hours until your symptoms have resolved.   diphenhydrAMINE  25 MG tablet Commonly known as: BENADRYL  Take 25 mg by mouth daily as needed for itching (seasonal allergies).   EpiPen 2-Pak 0.3 mg/0.3 mL Soaj injection Generic drug: EPINEPHrine Inject 0.3 mg into the muscle as needed for anaphylaxis.   fentaNYL  12 MCG/HR Commonly known as: DURAGESIC  Place 12 mcg onto the skin every 3 (three) days.   gabapentin  300 MG capsule Commonly known as: NEURONTIN  Take 300-600 mg by mouth See admin instructions. Take one capsule (300 mg) by mouth  every morning and mid-afternoon, take two capsules (600 mg) at bedtime   ibuprofen  200 MG tablet Commonly known as: ADVIL  Take 400 mg by mouth daily as needed (pain).   insulin  glargine 100 UNIT/ML Solostar Pen Commonly known as: LANTUS  0-15 Units, Subcutaneous, 3 times daily with meals, First dose on Thu 05/27/24 at 0800 Correction coverage: CBG < 70: Implement Hypoglycemia measures-call MD CBG 70 - 120: 0 units CBG 121 - 150: 2 units CBG 151 - 200: 3 units CBG 201 - 250: 5 units CBG 251 - 300: 8 units CBG 301 - 350: 11 units CBG 351 - 400: 15 units CBG > 400: call MD   Insulin  Pen Needle 32G X 4 MM Misc 1 each by Does not apply route as needed.   ketorolac  10 MG tablet Commonly known as: TORADOL  Take 1 tablet (10 mg total) by mouth every 6 (six) hours as needed (pain).   Lancet  Device Misc 1 each by Does not apply route in the morning, at noon, and at bedtime. May substitute to any manufacturer covered by patient's insurance.   Lancets Misc 1 each by Does not apply route as directed. Dispense based on patient and insurance preference. Use up to four times daily as directed. (FOR ICD-10 E10.9, E11.9).   lisinopril -hydrochlorothiazide  10-12.5 MG tablet Commonly known as: ZESTORETIC  Take 1 tablet by mouth daily.   metFORMIN  500 MG tablet Commonly known as: GLUCOPHAGE  Take 1 tablet (500 mg total) by mouth 2 (two) times daily with a meal.   multivitamin tablet Take 1 tablet by mouth every morning.   neomycin -polymyxin b-dexamethasone  3.5-10000-0.1 Oint Commonly known as: MAXITROL  Place 1 application into the right eye See admin instructions. Apply sparingly to right eye three times daily until healed   NovoLOG  FlexPen 100 UNIT/ML FlexPen Generic drug: insulin  aspart 0-9 Units, Subcutaneous, 3 times daily with meals CBG < 70: Implement Hypoglycemia measures CBG 70 - 120: 0 units CBG 121 - 150: 1 unit CBG 151 - 200: 2 units CBG 201 - 250: 3 units CBG 251 - 300: 5 units CBG 301 - 350: 7 units CBG 351 - 400: 9 units CBG > 400: call MD   oxyCODONE -acetaminophen  10-325 MG tablet Commonly known as: PERCOCET Take 1 tablet by mouth every 6 (six) hours.   tiZANidine  2 MG tablet Commonly known as: ZANAFLEX  Take 2 mg by mouth 3 (three) times daily as needed for muscle spasms.   zolpidem  10 MG tablet Commonly known as: AMBIEN  Take 10 mg by mouth at bedtime.        Follow-up Information     Ransom Other, MD. Schedule an appointment as soon as possible for a visit in 1 week(s).   Specialty: Internal Medicine Contact information: [address] [place] [address] [phone]                Allergies  Allergen Reactions   Bee Venom Anaphylaxis and Hives   Codeine Hives   Demerol Hives   Morphine  And Codeine Hives and Itching      Other Procedures/Studies: DG Chest 1 View Result Date: 05/26/2024 CLINICAL DATA:  141835 Weakness 141835 EXAM: CHEST  1 VIEW COMPARISON:  07/13/2023 FINDINGS: No focal airspace consolidation, pleural effusion, or pneumothorax. No cardiomegaly.No acute fracture or destructive lesion. Multilevel thoracic osteophytosis. IMPRESSION: No acute cardiopulmonary abnormality. Electronically Signed   By: Rogelia Myers M.D.   On: 05/26/2024 11:02     TODAY-DAY OF DISCHARGE:  Subjective:   Jon Lindroth  today has no headache,no chest abdominal pain,no new weakness tingling or numbness, feels much better wants to go home today.   Objective:   Blood pressure 127/85, pulse 72, temperature 97.7 F (36.5 C), temperature source Oral, resp. rate 18, height 5' 5 (1.651 m), weight 76 kg, SpO2 100%.  Intake/Output Summary (Last 24 hours) at 05/27/2024 0900 Last data filed at 05/26/2024 1733 Gross per 24 hour  Intake 230 ml  Output 800 ml  Net -570 ml   Filed Weights   05/26/24 0842  Weight: 76 kg    Exam: Awake Alert, Oriented *3, No new F.N deficits, Normal affect [place].AT,PERRAL Supple Neck,No JVD, No cervical lymphadenopathy appriciated.  Symmetrical Chest wall movement, Good air movement bilaterally, CTAB RRR,No Gallops,Rubs or new Murmurs, No Parasternal Heave +ve B.Sounds, Abd Soft, Non tender, No organomegaly appriciated, No rebound -guarding or rigidity. No Cyanosis, Clubbing or edema, No new Rash or bruise   PERTINENT RADIOLOGIC STUDIES: DG Chest 1 View Result Date: 05/26/2024 CLINICAL DATA:  141835 Weakness 141835 EXAM: CHEST  1 VIEW COMPARISON:  07/13/2023 FINDINGS: No focal airspace consolidation, pleural effusion, or pneumothorax. No cardiomegaly.No acute fracture or destructive lesion. Multilevel thoracic osteophytosis. IMPRESSION: No acute cardiopulmonary abnormality. Electronically Signed   By: Rogelia Myers M.D.   On: 05/26/2024 11:02     PERTINENT LAB  RESULTS: CBC: Recent Labs    05/26/24 1017 05/27/24 0523  WBC 11.5* 11.5*  HGB 15.8* 13.9  HCT 46.1* 40.0  PLT 289 240   CMET CMP     Component Value Date/Time   NA 136 05/27/2024 0523   K 3.5 05/27/2024 0523   CL 99 05/27/2024 0523   CO2 23 05/27/2024 0523   GLUCOSE 320 (H) 05/27/2024 0523   BUN 17 05/27/2024 0523   CREATININE 1.37 (H) 05/27/2024 0523   CALCIUM  8.9 05/27/2024 0523   PROT 7.2 05/26/2024 1017   ALBUMIN 3.7 05/26/2024 1017   AST 15 05/26/2024 1017   ALT 19 05/26/2024 1017   ALKPHOS 141 (H) 05/26/2024 1017   BILITOT 0.8 05/26/2024 1017   GFRNONAA 46 (L) 05/27/2024 0523    GFR Estimated Creatinine Clearance: 47.9 mL/min (A) (by C-G formula based on SCr of 1.37 mg/dL (H)). No results for input(s): LIPASE, AMYLASE in the last 72 hours. No results for input(s): CKTOTAL, CKMB, CKMBINDEX, TROPONINI in the last 72 hours. Invalid input(s): POCBNP No results for input(s): DDIMER in the last 72 hours. No results for input(s): HGBA1C in the last 72 hours. No results for input(s): CHOL, HDL, LDLCALC, TRIG, CHOLHDL, LDLDIRECT in the last 72 hours. No results for input(s): TSH, T4TOTAL, T3FREE, THYROIDAB in the last 72 hours.  Invalid input(s): FREET3 No results for input(s): VITAMINB12, FOLATE, FERRITIN, TIBC, IRON, RETICCTPCT in the last 72 hours. Coags: No results for input(s): INR in the last 72 hours.  Invalid input(s): PT Microbiology: No results found for this or any previous visit (from the past 240 hours).  FURTHER DISCHARGE INSTRUCTIONS:  Get Medicines reviewed and adjusted: Please take all your medications with you for your next visit with your Primary MD  Laboratory/radiological data: Please request your Primary MD to go over all hospital tests and procedure/radiological results at the follow up, please ask your Primary MD to get all Hospital records sent to his/her office.  In some cases,  they will be blood work, cultures and biopsy results pending at the time of your discharge. Please request that your primary care M.D. goes through all the records of your  hospital data and follows up on these results.  Also Note the following: If you experience worsening of your admission symptoms, develop shortness of breath, life threatening emergency, suicidal or homicidal thoughts you must seek medical attention immediately by calling 911 or calling your MD immediately  if symptoms less severe.  You must read complete instructions/literature along with all the possible adverse reactions/side effects for all the Medicines you take and that have been prescribed to you. Take any new Medicines after you have completely understood and accpet all the possible adverse reactions/side effects.   Do not drive when taking Pain medications or sleeping medications (Benzodaizepines)  Do not take more than prescribed Pain, Sleep and Anxiety Medications. It is not advisable to combine anxiety,sleep and pain medications without talking with your primary care practitioner  Special Instructions: If you have smoked or chewed Tobacco  in the last 2 yrs please stop smoking, stop any regular Alcohol  and or any Recreational drug use.  Wear Seat belts while driving.  Please note: You were cared for by a hospitalist during your hospital stay. Once you are discharged, your primary care physician will handle any further medical issues. Please note that NO REFILLS for any discharge medications will be authorized once you are discharged, as it is imperative that you return to your primary care physician (or establish a relationship with a primary care physician if you do not have one) for your post hospital discharge needs so that they can reassess your need for medications and monitor your lab values.  Total Time spent coordinating discharge including counseling, education and face to face time equals greater than 30  minutes.  Signed: Wyeth Hoffer 05/27/2024 9:00 AM

## 2024-05-27 NOTE — Care Management Obs Status (Signed)
 MEDICARE OBSERVATION STATUS NOTIFICATION   Patient Details  Name: Taylor Bowman MRN: [id number] Date of Birth: [birth year]   Medicare Observation Status Notification Given:  Yes    Tom-Johnson, Harvest Muskrat, RN 05/27/2024, 10:10 AM

## 2024-05-27 NOTE — TOC Transition Note (Addendum)
 Transition of Care [facility]) - Discharge Note   Patient Details  Name: Taylor Bowman MRN: [id number] Date of Birth: [birth year]  Transition of Care [facility] CM/SW Contact:  Tom-Johnson, Harvest Muskrat, RN Phone Number: 05/27/2024, 10:01 AM   Clinical Narrative:     Patient is scheduled for discharge today.  Readmission Risk Assessment done. Hospital f/u and discharge instructions on AVS. No ICM needs or recommendations noted. Patient states she drove self to the ED and will transport self at discharge.  No further ICM needs noted.   Discharge cancelled d/t nausea and elevated Blood Glucose.  CM will continue to follow as patient progresses with care towards discharge.     Final next level of care: Home/Self Care Barriers to Discharge: Barriers Resolved   Patient Goals and CMS Choice Patient states their goals for this hospitalization and ongoing recovery are:: To return home CMS Medicare.gov Compare Post Acute Care list provided to:: Patient Choice offered to / list presented to : NA      Discharge Placement                       Discharge Plan and Services Additional resources added to the After Visit Summary for                    DME Agency: NA       HH Arranged: NA HH Agency: NA        Social Drivers of Health (SDOH) Interventions SDOH Screenings   Food Insecurity: No Food Insecurity (05/26/2024)  Housing: Low Risk  (05/26/2024)  Transportation Needs: No Transportation Needs (05/26/2024)  Utilities: Not At Risk (05/26/2024)  Financial Resource Strain: Low Risk  (02/26/2024)   Received from [facility]  Social Connections: Moderately Isolated (05/26/2024)  Tobacco Use: Low Risk  (05/26/2024)     Readmission Risk Interventions    05/27/2024   10:00 AM  Readmission Risk Prevention Plan  Post Dischage Appt Complete  Medication Screening Complete  Transportation Screening Complete

## 2024-05-28 ENCOUNTER — Other Ambulatory Visit (HOSPITAL_COMMUNITY): Payer: Self-pay

## 2024-05-28 DIAGNOSIS — N179 Acute kidney failure, unspecified: Secondary | ICD-10-CM | POA: Diagnosis not present

## 2024-05-28 DIAGNOSIS — R748 Abnormal levels of other serum enzymes: Secondary | ICD-10-CM | POA: Diagnosis not present

## 2024-05-28 DIAGNOSIS — E1100 Type 2 diabetes mellitus with hyperosmolarity without nonketotic hyperglycemic-hyperosmolar coma (NKHHC): Secondary | ICD-10-CM | POA: Diagnosis not present

## 2024-05-28 LAB — GLUCOSE, CAPILLARY: Glucose-Capillary: 213 mg/dL — ABNORMAL HIGH (ref 70–99)

## 2024-05-28 LAB — BASIC METABOLIC PANEL WITH GFR
Anion gap: 12 (ref 5–15)
BUN: 17 mg/dL (ref 6–20)
CO2: 23 mmol/L (ref 22–32)
Calcium: 8.8 mg/dL — ABNORMAL LOW (ref 8.9–10.3)
Chloride: 99 mmol/L (ref 98–111)
Creatinine, Ser: 1.27 mg/dL — ABNORMAL HIGH (ref 0.44–1.00)
GFR, Estimated: 50 mL/min — ABNORMAL LOW (ref >60)
Glucose, Bld: 166 mg/dL — ABNORMAL HIGH (ref 70–99)
Potassium: 3.1 mmol/L — ABNORMAL LOW (ref 3.5–5.1)
Sodium: 134 mmol/L — ABNORMAL LOW (ref 135–145)

## 2024-05-28 LAB — HEMOGLOBIN A1C
Hgb A1c MFr Bld: 14.3 % — ABNORMAL HIGH (ref 4.8–5.6)
Mean Plasma Glucose: 364 mg/dL

## 2024-05-28 MED ORDER — BLOOD GLUCOSE TEST VI STRP
1.0000 | ORAL_STRIP | Freq: Three times a day (TID) | 0 refills | Status: AC
  Filled 2024-05-28: qty 100, 30d supply, fill #0

## 2024-05-28 MED ORDER — ACCU-CHEK SOFTCLIX LANCETS MISC
1.0000 | 0 refills | Status: AC
  Filled 2024-05-28: qty 100, 30d supply, fill #0

## 2024-05-28 MED ORDER — INSULIN GLARGINE 100 UNIT/ML SOLOSTAR PEN
30.0000 [IU] | PEN_INJECTOR | Freq: Every day | SUBCUTANEOUS | 1 refills | Status: AC
  Filled 2024-05-28: qty 15, 50d supply, fill #0

## 2024-05-28 MED ORDER — INSULIN GLARGINE-YFGN 100 UNIT/ML ~~LOC~~ SOLN
30.0000 [IU] | SUBCUTANEOUS | Status: DC
  Administered 2024-05-28: 30 [IU] via SUBCUTANEOUS
  Filled 2024-05-28: qty 0.3

## 2024-05-28 MED ORDER — NOVOLOG FLEXPEN 100 UNIT/ML ~~LOC~~ SOPN
PEN_INJECTOR | SUBCUTANEOUS | 0 refills | Status: AC
Start: 2024-05-28 — End: ?
  Filled 2024-05-28: qty 15, 100d supply, fill #0

## 2024-05-28 MED ORDER — POTASSIUM CHLORIDE CRYS ER 20 MEQ PO TBCR
40.0000 meq | EXTENDED_RELEASE_TABLET | Freq: Once | ORAL | Status: DC
  Filled 2024-05-28: qty 2

## 2024-05-28 MED ORDER — BLOOD GLUCOSE MONITOR SYSTEM W/DEVICE KIT
1.0000 | PACK | Freq: Three times a day (TID) | 0 refills | Status: AC
  Filled 2024-05-28: qty 1, 30d supply, fill #0

## 2024-05-28 MED ORDER — LANCET DEVICE MISC
1.0000 | Freq: Three times a day (TID) | 0 refills | Status: AC
  Filled 2024-05-28: qty 1, 30d supply, fill #0

## 2024-05-28 MED ORDER — INSULIN PEN NEEDLE 32G X 4 MM MISC
1.0000 | 1 refills | Status: AC | PRN
  Filled 2024-05-28: qty 100, 30d supply, fill #0

## 2024-05-28 MED ORDER — FREESTYLE LIBRE 3 SENSOR MISC
1 refills | Status: AC
  Filled 2024-05-28: qty 2, 30d supply, fill #0
  Filled 2024-07-02: qty 2, 28d supply, fill #1

## 2024-05-28 MED ORDER — METFORMIN HCL 500 MG PO TABS
500.0000 mg | ORAL_TABLET | Freq: Two times a day (BID) | ORAL | 1 refills | Status: AC
  Filled 2024-05-28: qty 60, 30d supply, fill #0

## 2024-05-28 MED ORDER — ONDANSETRON 4 MG PO TBDP
4.0000 mg | ORAL_TABLET | Freq: Three times a day (TID) | ORAL | 0 refills | Status: AC | PRN
  Filled 2024-05-28: qty 20, 7d supply, fill #0

## 2024-05-28 NOTE — Discharge Summary (Signed)
 PATIENT DETAILS Name: Taylor Bowman Age: 53 y.o. Sex: female Date of Birth: Dec 04, 1970 MRN: [id number]. Admitting Physician: Taylor DELENA Sharps, MD ERE:Yldjpw, Ardell, MD  Admit Date: 05/26/2024 Discharge date: 05/28/2024  Recommendations for Outpatient Follow-up:  Follow up with PCP in 1-2 weeks Please obtain CMP/CBC in one week Please optimize insulin  regimen  Admitted From:  Home  Disposition: Home   Discharge Condition: good  CODE STATUS:   Code Status: Full Code   Diet recommendation:  Diet Order             Diet - low sodium heart healthy           Diet Carb Modified           Diet Carb Modified Fluid consistency: Thin  Diet effective now                    Brief Summary: 53 year old female with history of HTN, HLD, chronic back pain (scheduled for back surgery December)-presented with nausea, weakness, urinary frequency, polydipsia-found to have hyperosmolar hyperglycemic nonketotic state secondary to newly diagnosed DM-2.  Started on insulin  infusion/IV fluids and admitted to the hospitalist service.  Brief Hospital Course: Hyperglycemic hyperosmolar nonketotic state Managed with IVF/IV insulin  CBGs have improved-patient clinically better and has been transition to subcutaneous insulin   Newly diagnosed DM-2 (A1c 14.3) CBGs improving-although not still optimal Increase Lantus  to 30 units on discharge, continue SSI Start metformin  on discharge Follow-up with PCP for further optimization. 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  AKI on CKD stage II AKI was likely secondary to osmotic diuresis in the setting of uncontrolled hyperglycemia-creatinine has  improved-patient appears to have mild CKD-probably at baseline.  Hypokalemia Replete-recheck at PCPs office.  Chronic back pain Resume usual prior outpatient regimen Follow-up with spine surgery/neurosurgery as previous-per patient-she is scheduled for back surgery sometime in December (next month)  Discharge Diagnoses:  Principal Problem:   Hyperosmolar hyperglycemic state (HHS) (HCC) Active Problems:   AKI (acute kidney injury)   Elevated alkaline phosphatase level   Discharge Instructions:  Activity:  As tolerated   Discharge Instructions     Call MD for:  persistant dizziness or light-headedness   Complete by: As directed    Call MD for:  persistant nausea and vomiting   Complete by: As directed    Diet - low sodium heart healthy   Complete by: As directed    Diet Carb Modified   Complete by: As directed    Discharge instructions   Complete by: As directed    Follow with Primary MD  Taylor Ardell, MD in 1-2 weeks  Please check your blood glucose/CBGs multiple times a day-including at least 1 fasting reading, keep a record of these readings and take it to your next appointment with your primary care practitioner.  Please get a complete blood count and chemistry panel checked by your Primary MD at your next visit, and again as instructed by your Primary MD.  Get Medicines reviewed and adjusted: Please take all your medications with you for your next visit with your Primary MD  Laboratory/radiological data: Please request your Primary MD to go over all hospital tests and procedure/radiological results at the follow up, please ask your Primary  MD to get all Hospital records sent to his/her office.  In some cases, they will be blood work, cultures and biopsy results pending at the time of your discharge. Please request that your primary care M.D. follows up on these results.  Also Note the following: If you experience worsening of your admission symptoms, develop  shortness of breath, life threatening emergency, suicidal or homicidal thoughts you must seek medical attention immediately by calling 911 or calling your MD immediately  if symptoms less severe.  You must read complete instructions/literature along with all the possible adverse reactions/side effects for all the Medicines you take and that have been prescribed to you. Take any new Medicines after you have completely understood and accpet all the possible adverse reactions/side effects.   Do not drive when taking Pain medications or sleeping medications (Benzodaizepines)  Do not take more than prescribed Pain, Sleep and Anxiety Medications. It is not advisable to combine anxiety,sleep and pain medications without talking with your primary care practitioner  Special Instructions: If you have smoked or chewed Tobacco  in the last 2 yrs please stop smoking, stop any regular Alcohol  and or any Recreational drug use.  Wear Seat belts while driving.  Please note: You were cared for by a hospitalist during your hospital stay. Once you are discharged, your primary care physician will handle any further medical issues. Please note that NO REFILLS for any discharge medications will be authorized once you are discharged, as it is imperative that you return to your primary care physician (or establish a relationship with a primary care physician if you do not have one) for your post hospital discharge needs so that they can reassess your need for medications and monitor your lab values.   Increase activity slowly   Complete by: As directed       Allergies as of 05/28/2024       Reactions   Bee Venom Anaphylaxis, Hives   Codeine Hives   Demerol Hives   Morphine  And Codeine Hives, Itching        Medication List     TAKE these medications    ALPRAZolam  0.5 MG tablet Commonly known as: XANAX  Take 1 tablet (0.5 mg total) by mouth daily as needed for anxiety.   atorvastatin  40 MG  tablet Commonly known as: LIPITOR Take 1 tablet (40 mg total) by mouth daily at 6 PM. What changed: when to take this   Blood Glucose Monitoring Suppl Devi 1 each by Does not apply route in the morning, at noon, and at bedtime. May substitute to any manufacturer covered by patient's insurance.   BLOOD GLUCOSE TEST STRIPS Strp 1 each by In Vitro route in the morning, at noon, and at bedtime. May substitute to any manufacturer covered by patient's insurance.   colchicine  0.6 MG tablet Take 2 tablets now.  Take 1 tablet 1 hour after the first 2 tablets.  Starting tomorrow, take 1 tablet every 12 hours until your symptoms have resolved.   diphenhydrAMINE  25 MG tablet Commonly known as: BENADRYL  Take 25 mg by mouth daily as needed for itching (seasonal allergies).   EpiPen 2-Pak 0.3 mg/0.3 mL Soaj injection Generic drug: EPINEPHrine Inject 0.3 mg into the muscle as needed for anaphylaxis.   fentaNYL  12 MCG/HR Commonly known as: DURAGESIC  Place 12 mcg onto the skin every 3 (three) days.   FreeStyle Libre 3 Sensor Misc Place 1 sensor on the skin every 14 days. Use to check glucose continuously   gabapentin   300 MG capsule Commonly known as: NEURONTIN  Take 300-600 mg by mouth See admin instructions. Take one capsule (300 mg) by mouth every morning and mid-afternoon, take two capsules (600 mg) at bedtime   ibuprofen  200 MG tablet Commonly known as: ADVIL  Take 400 mg by mouth daily as needed (pain).   insulin  glargine 100 UNIT/ML Solostar Pen Commonly known as: LANTUS  Inject 30 Units into the skin daily.   Insulin  Pen Needle 32G X 4 MM Misc 1 each by Does not apply route as needed.   ketorolac  10 MG tablet Commonly known as: TORADOL  Take 1 tablet (10 mg total) by mouth every 6 (six) hours as needed (pain).   Lancet Device Misc 1 each by Does not apply route in the morning, at noon, and at bedtime. May substitute to any manufacturer covered by patient's insurance.   Lancets  Misc 1 each by Does not apply route as directed. Dispense based on patient and insurance preference. Use up to four times daily as directed. (FOR ICD-10 E10.9, E11.9).   lisinopril -hydrochlorothiazide  10-12.5 MG tablet Commonly known as: ZESTORETIC  Take 1 tablet by mouth daily.   metFORMIN  500 MG tablet Commonly known as: GLUCOPHAGE  Take 1 tablet (500 mg total) by mouth 2 (two) times daily with a meal.   multivitamin tablet Take 1 tablet by mouth every morning.   neomycin -polymyxin b-dexamethasone  3.5-10000-0.1 Oint Commonly known as: MAXITROL  Place 1 application into the right eye See admin instructions. Apply sparingly to right eye three times daily until healed   NovoLOG  FlexPen 100 UNIT/ML FlexPen Generic drug: insulin  aspart 0-9 Units, Subcutaneous, 3 times daily with meals CBG < 70: Implement Hypoglycemia measures CBG 70 - 120: 0 units CBG 121 - 150: 1 unit CBG 151 - 200: 2 units CBG 201 - 250: 3 units CBG 251 - 300: 5 units CBG 301 - 350: 7 units CBG 351 - 400: 9 units CBG > 400: call MD   ondansetron  4 MG disintegrating tablet Commonly known as: ZOFRAN -ODT Take 1 tablet (4 mg total) by mouth every 8 (eight) hours as needed for nausea or vomiting.   oxyCODONE -acetaminophen  10-325 MG tablet Commonly known as: PERCOCET Take 1 tablet by mouth every 6 (six) hours.   tiZANidine  2 MG tablet Commonly known as: ZANAFLEX  Take 2 mg by mouth 3 (three) times daily as needed for muscle spasms.   zolpidem  10 MG tablet Commonly known as: AMBIEN  Take 10 mg by mouth at bedtime.        Follow-up Information     Taylor Other, MD. Schedule an appointment as soon as possible for a visit in 1 week(s).   Specialty: Internal Medicine Contact information: [address] [place] [address] [phone]                Allergies  Allergen Reactions   Bee Venom Anaphylaxis and Hives   Codeine Hives   Demerol Hives   Morphine  And Codeine Hives and Itching      Other Procedures/Studies: DG Chest 1 View Result Date: 05/26/2024 CLINICAL DATA:  141835 Weakness 141835 EXAM: CHEST  1 VIEW COMPARISON:  07/13/2023 FINDINGS: No focal airspace consolidation, pleural effusion, or pneumothorax. No cardiomegaly.No acute fracture or destructive lesion. Multilevel thoracic osteophytosis. IMPRESSION: No acute cardiopulmonary abnormality. Electronically Signed   By: Rogelia Myers M.D.   On: 05/26/2024 11:02     TODAY-DAY OF DISCHARGE:  Subjective:   Taylor Bowman today has no headache,no chest abdominal pain,no new weakness tingling or numbness, feels  much better wants to go home today.   Objective:   Blood pressure (!) 127/99, pulse 69, temperature 98.3 F (36.8 C), temperature source Oral, resp. rate 16, height 5' 5 (1.651 m), weight 76 kg, SpO2 100%. No intake or output data in the 24 hours ending 05/28/24 1002  Filed Weights   05/26/24 0842  Weight: 76 kg    Exam: Awake Alert, Oriented *3, No new F.N deficits, Normal affect [place].AT,PERRAL Supple Neck,No JVD, No cervical lymphadenopathy appriciated.  Symmetrical Chest wall movement, Good air movement bilaterally, CTAB RRR,No Gallops,Rubs or new Murmurs, No Parasternal Heave +ve B.Sounds, Abd Soft, Non tender, No organomegaly appriciated, No rebound -guarding or rigidity. No Cyanosis, Clubbing or edema, No new Rash or bruise   PERTINENT RADIOLOGIC STUDIES: DG Chest 1 View Result Date: 05/26/2024 CLINICAL DATA:  141835 Weakness 141835 EXAM: CHEST  1 VIEW COMPARISON:  07/13/2023 FINDINGS: No focal airspace consolidation, pleural effusion, or pneumothorax. No cardiomegaly.No acute fracture or destructive lesion. Multilevel thoracic osteophytosis. IMPRESSION: No acute cardiopulmonary abnormality. Electronically Signed   By: Rogelia Myers M.D.   On: 05/26/2024 11:02     PERTINENT LAB RESULTS: CBC: Recent Labs    05/26/24 1017 05/27/24 0523  WBC 11.5* 11.5*  HGB 15.8* 13.9  HCT 46.1*  40.0  PLT 289 240   CMET CMP     Component Value Date/Time   NA 134 (L) 05/28/2024 0448   K 3.1 (L) 05/28/2024 0448   CL 99 05/28/2024 0448   CO2 23 05/28/2024 0448   GLUCOSE 166 (H) 05/28/2024 0448   BUN 17 05/28/2024 0448   CREATININE 1.27 (H) 05/28/2024 0448   CALCIUM  8.8 (L) 05/28/2024 0448   PROT 7.2 05/26/2024 1017   ALBUMIN 3.7 05/26/2024 1017   AST 15 05/26/2024 1017   ALT 19 05/26/2024 1017   ALKPHOS 141 (H) 05/26/2024 1017   BILITOT 0.8 05/26/2024 1017   GFRNONAA 50 (L) 05/28/2024 0448    GFR Estimated Creatinine Clearance: 51.6 mL/min (A) (by C-G formula based on SCr of 1.27 mg/dL (H)). No results for input(s): LIPASE, AMYLASE in the last 72 hours. No results for input(s): CKTOTAL, CKMB, CKMBINDEX, TROPONINI in the last 72 hours. Invalid input(s): POCBNP No results for input(s): DDIMER in the last 72 hours. Recent Labs    05/26/24 1017  HGBA1C 14.3*   No results for input(s): CHOL, HDL, LDLCALC, TRIG, CHOLHDL, LDLDIRECT in the last 72 hours. No results for input(s): TSH, T4TOTAL, T3FREE, THYROIDAB in the last 72 hours.  Invalid input(s): FREET3 No results for input(s): VITAMINB12, FOLATE, FERRITIN, TIBC, IRON, RETICCTPCT in the last 72 hours. Coags: No results for input(s): INR in the last 72 hours.  Invalid input(s): PT Microbiology: No results found for this or any previous visit (from the past 240 hours).  FURTHER DISCHARGE INSTRUCTIONS:  Get Medicines reviewed and adjusted: Please take all your medications with you for your next visit with your Primary MD  Laboratory/radiological data: Please request your Primary MD to go over all hospital tests and procedure/radiological results at the follow up, please ask your Primary MD to get all Hospital records sent to his/her office.  In some cases, they will be blood work, cultures and biopsy results pending at the time of your discharge. Please  request that your primary care M.D. goes through all the records of your hospital data and follows up on these results.  Also Note the following: If you experience worsening of your admission symptoms, develop shortness of breath, life threatening  emergency, suicidal or homicidal thoughts you must seek medical attention immediately by calling 911 or calling your MD immediately  if symptoms less severe.  You must read complete instructions/literature along with all the possible adverse reactions/side effects for all the Medicines you take and that have been prescribed to you. Take any new Medicines after you have completely understood and accpet all the possible adverse reactions/side effects.   Do not drive when taking Pain medications or sleeping medications (Benzodaizepines)  Do not take more than prescribed Pain, Sleep and Anxiety Medications. It is not advisable to combine anxiety,sleep and pain medications without talking with your primary care practitioner  Special Instructions: If you have smoked or chewed Tobacco  in the last 2 yrs please stop smoking, stop any regular Alcohol  and or any Recreational drug use.  Wear Seat belts while driving.  Please note: You were cared for by a hospitalist during your hospital stay. Once you are discharged, your primary care physician will handle any further medical issues. Please note that NO REFILLS for any discharge medications will be authorized once you are discharged, as it is imperative that you return to your primary care physician (or establish a relationship with a primary care physician if you do not have one) for your post hospital discharge needs so that they can reassess your need for medications and monitor your lab values.  Total Time spent coordinating discharge including counseling, education and face to face time equals greater than 30 minutes.  Signed: Vanden Fawaz 05/28/2024 10:02 AM

## 2024-05-28 NOTE — Telephone Encounter (Signed)
 Pharmacy Patient Advocate Encounter  Received notification from [employer] that Prior Authorization for FreeStyle Libre 3 Plus Sensor  has been APPROVED from 05/26/2024 to 06/30/2025   PA #/Case ID/Reference #: [id number] KEY: [id number] 

## 2024-05-28 NOTE — Inpatient Diabetes Management (Signed)
 Inpatient Diabetes Program Recommendations  AACE/ADA: New Consensus Statement on Inpatient Glycemic Control (2015)  Target Ranges:  Prepandial:   less than 140 mg/dL      Peak postprandial:   less than 180 mg/dL (1-2 hours)      Critically ill patients:  140 - 180 mg/dL   Lab Results  Component Value Date   GLUCAP 213 (H) 05/28/2024   HGBA1C 14.3 (H) 05/26/2024   MD ordered application of Freestyle CGM at discharge for patient. Education done regarding application and changing CGM sensor (alternate every 15 days on back of arms), 1 hour warm-up, use of glucometer when alert displays, how to scan CGM for glucose reading and information for PCP. Patient has also been given educational packet regarding use CGM sensor including the 1-800 toll free number for any questions, problems or needs related to the [facility] sensors or reader.    Sensor applied by patient to (L) Arm at 9:30 am.  Explained that glucose readings will not be available until 1 hour after application. Reviewed use of CGM including how to scan, changing Sensor, Vitamin C warning, arrows with glucose readings, and Freestyle app. Patient very appreciative. Gave patient 2 sample sensors for home use.  Reviewed questions regarding insulin  pen and nutrition. Patient states no questions and no further needs @ this time.   Thank you, Jennings Corado E. Kinan Safley, RN, MSN, CNS, CDCES  Diabetes Coordinator Inpatient Glycemic Control Team Team Pager [phone] (8am-5pm) 05/28/2024 10:09 AM

## 2024-05-28 NOTE — TOC Transition Note (Signed)
 Transition of Care [facility]) - Discharge Note   Patient Details  Name: Taylor Bowman MRN: [id number] Date of Birth: 09/29/1970  Transition of [facility] CM/SW Contact:  Landry DELENA Senters, RN Phone Number: 05/28/2024, 10:08 AM   Clinical Narrative:     Patient will be discharging to home today, with transportation per self.   Patient has PCP in place, manages own medications, prescriptions sent to [facility] pharmacy.   No other needs identified by CM.  Final next level of care: Home/Self Care Barriers to Discharge: No Barriers Identified   Patient Goals and CMS Choice Patient states their goals for this hospitalization and ongoing recovery are:: To return home CMS Medicare.gov Compare Post Acute Care list provided to:: Patient Choice offered to / list presented to : NA      Discharge Placement                Patient to be transferred to facility by: Self      Discharge Plan and Services Additional resources added to the After Visit Summary for                    DME Agency: NA       HH Arranged: NA HH Agency: NA        Social Drivers of Health (SDOH) Interventions SDOH Screenings   Food Insecurity: No Food Insecurity (05/26/2024)  Housing: Low Risk  (05/26/2024)  Transportation Needs: No Transportation Needs (05/26/2024)  Utilities: Not At Risk (05/26/2024)  Financial Resource Strain: Low Risk  (02/26/2024)   [facility] from [facility]  Social Connections: Moderately Isolated (05/26/2024)  Tobacco Use: Low Risk  (05/26/2024)     Readmission Risk Interventions    05/27/2024   10:00 AM  Readmission Risk Prevention Plan  Post Dischage Appt Complete  Medication Screening Complete  Transportation Screening Complete

## 2024-05-28 NOTE — Plan of Care (Signed)

## 2024-06-09 DIAGNOSIS — E1100 Type 2 diabetes mellitus with hyperosmolarity without nonketotic hyperglycemic-hyperosmolar coma (NKHHC): Secondary | ICD-10-CM | POA: Diagnosis not present

## 2024-06-13 ENCOUNTER — Encounter (HOSPITAL_COMMUNITY): Payer: Self-pay | Admitting: Emergency Medicine

## 2024-06-13 ENCOUNTER — Ambulatory Visit (HOSPITAL_COMMUNITY)

## 2024-06-13 ENCOUNTER — Ambulatory Visit (HOSPITAL_COMMUNITY)
Admission: EM | Admit: 2024-06-13 | Discharge: 2024-06-13 | Disposition: A | Discharge status: Home / Self Care | Attending: Student | Admitting: Student

## 2024-06-13 DIAGNOSIS — R051 Acute cough: Secondary | ICD-10-CM

## 2024-06-13 DIAGNOSIS — J0100 Acute maxillary sinusitis, unspecified: Secondary | ICD-10-CM

## 2024-06-13 DIAGNOSIS — J40 Bronchitis, not specified as acute or chronic: Secondary | ICD-10-CM | POA: Diagnosis not present

## 2024-06-13 DIAGNOSIS — J189 Pneumonia, unspecified organism: Secondary | ICD-10-CM

## 2024-06-13 MED ORDER — IPRATROPIUM-ALBUTEROL 0.5-2.5 (3) MG/3ML IN SOLN
3.0000 mL | Freq: Once | RESPIRATORY_TRACT | Status: AC
  Administered 2024-06-13: 3 mL via RESPIRATORY_TRACT

## 2024-06-13 MED ORDER — AMOXICILLIN-POT CLAVULANATE 875-125 MG PO TABS: 1.0000 | ORAL_TABLET | Freq: Two times a day (BID) | ORAL | 0 refills | Status: AC

## 2024-06-13 MED ORDER — IPRATROPIUM-ALBUTEROL 0.5-2.5 (3) MG/3ML IN SOLN
RESPIRATORY_TRACT | Status: AC
  Filled 2024-06-13: qty 3

## 2024-06-13 MED ORDER — ALBUTEROL SULFATE HFA 108 (90 BASE) MCG/ACT IN AERS: 1.0000 | INHALATION_SPRAY | Freq: Four times a day (QID) | RESPIRATORY_TRACT | 0 refills | Status: AC | PRN

## 2024-06-13 NOTE — Discharge Instructions (Addendum)
-  I am worried that you are developing a lung infection called pneumonia -We are treating with an antibiotic called Augmentin  that will treat your lungs, and sinuses.  -Start the antibiotic-Augmentin  (amoxicillin -clavulanate), 1 pill every 12 hours for 7 days.  You can take this with food like with breakfast and dinner. -Albuterol  inhaler as needed for cough, wheezing, shortness of breath, 1 to 2 puffs every 6 hours as needed. -Your cough should slowly get better instead of worse. If you develop a cough productive of dark or red sputum, new shortness of breath, new chest tightness, new fevers, etc - seek additional care.
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
## 2024-06-13 NOTE — ED Triage Notes (Signed)
 Patient has a headache, cough patient gets  short of breath when walking up the steps,  sore throat, swollen lymph node  that has been going on for 5 to 6 days.  Patient is taken tylenol  cold and flu last dose was yesterday morning

## 2024-06-14 ENCOUNTER — Ambulatory Visit (HOSPITAL_COMMUNITY): Payer: Self-pay

## 2024-06-28 ENCOUNTER — Other Ambulatory Visit (HOSPITAL_COMMUNITY): Payer: Self-pay

## 2024-07-02 ENCOUNTER — Other Ambulatory Visit (HOSPITAL_COMMUNITY): Payer: Self-pay
